# Patient Record
Sex: Female | Born: 1955
Health system: Southern US, Community
[De-identification: ages and names within clinical notes are randomized; demographics above are authoritative.]

## PROBLEM LIST (undated history)

## (undated) DIAGNOSIS — R011 Cardiac murmur, unspecified: Secondary | ICD-10-CM

## (undated) DIAGNOSIS — F419 Anxiety disorder, unspecified: Secondary | ICD-10-CM

## (undated) DIAGNOSIS — R112 Nausea with vomiting, unspecified: Secondary | ICD-10-CM

## (undated) DIAGNOSIS — K5792 Diverticulitis of intestine, part unspecified, without perforation or abscess without bleeding: Secondary | ICD-10-CM

## (undated) DIAGNOSIS — J449 Chronic obstructive pulmonary disease, unspecified: Secondary | ICD-10-CM

## (undated) DIAGNOSIS — Z87898 Personal history of other specified conditions: Secondary | ICD-10-CM

## (undated) DIAGNOSIS — J45909 Unspecified asthma, uncomplicated: Secondary | ICD-10-CM

## (undated) DIAGNOSIS — R0600 Dyspnea, unspecified: Secondary | ICD-10-CM

## (undated) DIAGNOSIS — G971 Other reaction to spinal and lumbar puncture: Secondary | ICD-10-CM

## (undated) DIAGNOSIS — Z9889 Other specified postprocedural states: Secondary | ICD-10-CM

## (undated) DIAGNOSIS — M1990 Unspecified osteoarthritis, unspecified site: Secondary | ICD-10-CM

## (undated) DIAGNOSIS — K589 Irritable bowel syndrome without diarrhea: Secondary | ICD-10-CM

## (undated) DIAGNOSIS — G894 Chronic pain syndrome: Secondary | ICD-10-CM

## (undated) DIAGNOSIS — K219 Gastro-esophageal reflux disease without esophagitis: Secondary | ICD-10-CM

## (undated) DIAGNOSIS — I10 Essential (primary) hypertension: Secondary | ICD-10-CM

## (undated) HISTORY — PX: ABDOMINAL HYSTERECTOMY: SHX81

## (undated) HISTORY — PX: CHOLECYSTECTOMY: SHX55

## (undated) HISTORY — DX: Chronic pain syndrome: G89.4

## (undated) HISTORY — PX: CYST EXCISION: SHX5701

## (undated) HISTORY — PX: TUBAL LIGATION: SHX77

## (undated) HISTORY — PX: BACK SURGERY: SHX140

## (undated) HISTORY — PX: EYE SURGERY: SHX253

---

## 2000-10-10 ENCOUNTER — Encounter: Admission: RE | Admit: 2000-10-10 | Discharge: 2000-10-10 | Payer: Self-pay | Admitting: *Deleted

## 2001-05-20 ENCOUNTER — Encounter: Payer: Self-pay | Admitting: Emergency Medicine

## 2001-05-20 ENCOUNTER — Emergency Department (HOSPITAL_COMMUNITY): Admission: EM | Admit: 2001-05-20 | Discharge: 2001-05-20 | Payer: Self-pay | Admitting: Emergency Medicine

## 2001-12-24 ENCOUNTER — Encounter: Admission: RE | Admit: 2001-12-24 | Discharge: 2001-12-24 | Payer: Self-pay | Admitting: Internal Medicine

## 2001-12-24 ENCOUNTER — Encounter: Payer: Self-pay | Admitting: Internal Medicine

## 2001-12-25 ENCOUNTER — Ambulatory Visit (HOSPITAL_COMMUNITY): Admission: RE | Admit: 2001-12-25 | Discharge: 2001-12-25 | Payer: Self-pay | Admitting: Internal Medicine

## 2001-12-25 ENCOUNTER — Encounter: Payer: Self-pay | Admitting: Internal Medicine

## 2002-01-03 ENCOUNTER — Ambulatory Visit (HOSPITAL_COMMUNITY): Admission: RE | Admit: 2002-01-03 | Discharge: 2002-01-03 | Payer: Self-pay | Admitting: Internal Medicine

## 2002-01-03 ENCOUNTER — Encounter: Payer: Self-pay | Admitting: Internal Medicine

## 2002-09-16 ENCOUNTER — Encounter: Payer: Self-pay | Admitting: Emergency Medicine

## 2002-09-16 ENCOUNTER — Emergency Department (HOSPITAL_COMMUNITY): Admission: EM | Admit: 2002-09-16 | Discharge: 2002-09-16 | Payer: Self-pay | Admitting: Emergency Medicine

## 2002-09-25 ENCOUNTER — Ambulatory Visit (HOSPITAL_COMMUNITY): Admission: RE | Admit: 2002-09-25 | Discharge: 2002-09-25 | Payer: Self-pay | Admitting: Family Medicine

## 2002-09-25 ENCOUNTER — Encounter: Payer: Self-pay | Admitting: Family Medicine

## 2002-11-28 ENCOUNTER — Encounter: Payer: Self-pay | Admitting: Internal Medicine

## 2002-11-28 ENCOUNTER — Ambulatory Visit (HOSPITAL_COMMUNITY): Admission: RE | Admit: 2002-11-28 | Discharge: 2002-11-28 | Payer: Self-pay | Admitting: Internal Medicine

## 2003-03-16 ENCOUNTER — Encounter: Payer: Self-pay | Admitting: *Deleted

## 2003-03-16 ENCOUNTER — Emergency Department (HOSPITAL_COMMUNITY): Admission: EM | Admit: 2003-03-16 | Discharge: 2003-03-16 | Payer: Self-pay | Admitting: *Deleted

## 2003-03-30 ENCOUNTER — Encounter: Payer: Self-pay | Admitting: Family Medicine

## 2003-03-30 ENCOUNTER — Ambulatory Visit (HOSPITAL_COMMUNITY): Admission: RE | Admit: 2003-03-30 | Discharge: 2003-03-30 | Payer: Self-pay | Admitting: Family Medicine

## 2003-04-11 ENCOUNTER — Inpatient Hospital Stay (HOSPITAL_COMMUNITY): Admission: EM | Admit: 2003-04-11 | Discharge: 2003-04-15 | Payer: Self-pay | Admitting: Emergency Medicine

## 2003-04-11 ENCOUNTER — Encounter: Payer: Self-pay | Admitting: Internal Medicine

## 2003-04-13 ENCOUNTER — Encounter: Payer: Self-pay | Admitting: Family Medicine

## 2003-06-30 ENCOUNTER — Emergency Department (HOSPITAL_COMMUNITY): Admission: EM | Admit: 2003-06-30 | Discharge: 2003-06-30 | Payer: Self-pay | Admitting: *Deleted

## 2003-06-30 ENCOUNTER — Encounter: Payer: Self-pay | Admitting: *Deleted

## 2003-10-08 ENCOUNTER — Emergency Department (HOSPITAL_COMMUNITY): Admission: EM | Admit: 2003-10-08 | Discharge: 2003-10-09 | Payer: Self-pay | Admitting: Emergency Medicine

## 2003-10-30 ENCOUNTER — Ambulatory Visit (HOSPITAL_COMMUNITY): Admission: RE | Admit: 2003-10-30 | Discharge: 2003-10-30 | Payer: Self-pay | Admitting: Internal Medicine

## 2003-11-24 ENCOUNTER — Emergency Department (HOSPITAL_COMMUNITY): Admission: EM | Admit: 2003-11-24 | Discharge: 2003-11-24 | Payer: Self-pay | Admitting: Emergency Medicine

## 2003-11-25 ENCOUNTER — Ambulatory Visit (HOSPITAL_COMMUNITY): Admission: RE | Admit: 2003-11-25 | Discharge: 2003-11-25 | Payer: Self-pay | Admitting: Internal Medicine

## 2005-02-25 ENCOUNTER — Emergency Department (HOSPITAL_COMMUNITY): Admission: EM | Admit: 2005-02-25 | Discharge: 2005-02-25 | Payer: Self-pay | Admitting: Emergency Medicine

## 2005-09-11 HISTORY — PX: OTHER SURGICAL HISTORY: SHX169

## 2008-10-23 ENCOUNTER — Encounter (INDEPENDENT_AMBULATORY_CARE_PROVIDER_SITE_OTHER): Payer: Self-pay | Admitting: *Deleted

## 2008-12-08 ENCOUNTER — Inpatient Hospital Stay (HOSPITAL_COMMUNITY): Admission: EM | Admit: 2008-12-08 | Discharge: 2008-12-13 | Payer: Self-pay | Admitting: Emergency Medicine

## 2008-12-23 ENCOUNTER — Ambulatory Visit (HOSPITAL_COMMUNITY): Admission: RE | Admit: 2008-12-23 | Discharge: 2008-12-23 | Payer: Self-pay | Admitting: Internal Medicine

## 2009-02-25 ENCOUNTER — Emergency Department (HOSPITAL_COMMUNITY): Admission: EM | Admit: 2009-02-25 | Discharge: 2009-02-25 | Payer: Self-pay | Admitting: Emergency Medicine

## 2009-04-01 ENCOUNTER — Inpatient Hospital Stay (HOSPITAL_COMMUNITY): Admission: EM | Admit: 2009-04-01 | Discharge: 2009-04-04 | Payer: Self-pay | Admitting: Emergency Medicine

## 2009-10-06 ENCOUNTER — Ambulatory Visit (HOSPITAL_COMMUNITY): Admission: RE | Admit: 2009-10-06 | Discharge: 2009-10-06 | Payer: Self-pay | Admitting: Internal Medicine

## 2010-01-20 ENCOUNTER — Telehealth (INDEPENDENT_AMBULATORY_CARE_PROVIDER_SITE_OTHER): Payer: Self-pay | Admitting: *Deleted

## 2010-04-01 ENCOUNTER — Encounter (INDEPENDENT_AMBULATORY_CARE_PROVIDER_SITE_OTHER): Payer: Self-pay | Admitting: *Deleted

## 2010-05-06 ENCOUNTER — Ambulatory Visit: Payer: Self-pay | Admitting: Internal Medicine

## 2010-05-06 DIAGNOSIS — K5900 Constipation, unspecified: Secondary | ICD-10-CM | POA: Insufficient documentation

## 2010-05-06 DIAGNOSIS — K219 Gastro-esophageal reflux disease without esophagitis: Secondary | ICD-10-CM

## 2010-05-09 ENCOUNTER — Encounter: Payer: Self-pay | Admitting: Internal Medicine

## 2010-05-20 ENCOUNTER — Ambulatory Visit (HOSPITAL_COMMUNITY): Admission: RE | Admit: 2010-05-20 | Discharge: 2010-05-20 | Payer: Self-pay | Admitting: Internal Medicine

## 2010-05-20 ENCOUNTER — Ambulatory Visit: Payer: Self-pay | Admitting: Internal Medicine

## 2010-05-20 HISTORY — PX: COLONOSCOPY: SHX174

## 2010-06-15 ENCOUNTER — Encounter: Payer: Self-pay | Admitting: Urgent Care

## 2010-10-11 NOTE — Letter (Signed)
Summary: Generic Letter, Intro to Referring  [facility]  [address]   [place], [place] [address]   Phone: [phone]  Fax: [phone]          April 01, 2010                RE: Gina Wolfe   06/29/1956                 [address]                 [place], [place]  [address]    Dear Ms. Erman,   Our office has been unable to contact you by phone.  Your primary care doctor would like you to schedule an appointment with [place].  Please contact our office at [phone].      Sincerely,    Elinor Parkinson  [facility] Ph: [phone]   Fax: [phone]

## 2010-10-11 NOTE — Letter (Signed)
Summary: egd/tcs orders  egd/tcs orders   Imported By: Rosine Beat 05/09/2010 16:41:37  _____________________________________________________________________  External Attachment:    Type:   Image     Comment:   External Document

## 2010-10-11 NOTE — Letter (Signed)
Summary: egd/tcs order  egd/tcs order   Imported By: Rosine Beat 05/06/2010 15:57:38  _____________________________________________________________________  External Attachment:    Type:   Image     Comment:   External Document

## 2010-10-11 NOTE — Medication Information (Signed)
Summary: PA for amitiza  PA for amitiza   Imported By: Hendricks Limes LPN 95/62/1308 65:78:46  _____________________________________________________________________  External Attachment:    Type:   Image     Comment:   External Document

## 2010-10-11 NOTE — Assessment & Plan Note (Signed)
Summary: abd pain,constipation,gerd referred by fusco/jbb   Preventive Screening-Counseling & Management  Alcohol-Tobacco     Smoking Status: never  Current Medications (verified): 1)  Premarin 0.625 Mg Tabs (Estrogens Conjugated) .... Once Daily 2)  Xanax 1 Mg Tabs (Alprazolam) .... As Needed 3)  Vitamin C .... Once Daily 4)  Vitamin E .... Once Daily 5)  Vitamin D .... Once Daily 6)  Fish Oil 1000 Mg Caps (Omega-3 Fatty Acids) .... Once Daily  Allergies (verified): 1)  ! Codeine 2)  ! Clindamycin Hcl (Clindamycin Hcl)  Past History:  Past Surgical History: 2 lower back neck knee hyst gallbladder  Family History: Father: deceased cancer Mother: deceased heart Siblings: 5 brothers, 38 sisters No FH of Colon Cancer:  Social History: Marital Status: Married Children: 2 Occupation: yes, [place] Patient has never smoked.  Alcohol Use - no Smoking Status:  never  Vital Signs:  Patient profile:   55 year old female Height:      62 inches Weight:      147 pounds BMI:     26.98 Temp:     97.7 degrees F oral Pulse rate:   68 / minute BP sitting:   118 / 70  (left arm) Cuff size:   regular  Vitals Entered By: Hendricks Limes LPN (May 06, 2010 3:11 PM)  Appended Document: abd pain,constipation,gerd referred by fusco/jbb  55 year old lady  with a three-year history of worsening GERD symptoms she describes retrosternal burning and heartburn symptoms. She's gained 26 pounds since she stopped smoking. No dysphagia. No nausea or vomiting - chronic left-sided abdominal pain; she is chronically constipated. Maybe, one or 2 bowel movements weekly; she's tried a number of over-the-counter agents in the past without any relief (including miralax); may have tried [place] but is not sure. History of diverticulosis and no prior CT. Reportedly had a colonoscopy for nonspecific symptoms at [facility] 10 years ago without significant findings. No history of colon polyps or cancer in the  family. No prior EGD.    Physical examination: Alert conversant no acute distress  Chest lungs are clear to auscultation; cardiac exam regular rate and rhythm without murmur gallop rub  Abdomen somewhat obese positive bowel sounds some left-sided mid and left for quadrant abdominal tenderness. No appreciable mass or organomegaly.  Rectal exam deferred to colonoscopy  Impression: 55 year old lady with new onset reflux symptoms worsening over the past 3 years. No doubt, significant weight gain has likely predisposed her reflux. She needs further evaluation. She is chronically constipated and has a left-sided abdominal pain. She does not look acutely ill this time. Will offer her a screening colonoscopy as well in the near future.  Risk, benefits, limitations, alternatives and imponderables have been reviewed; her questions were answered; she is agreeable. We'll make further recommendations once EGD and colonoscopy have been carried out.  I thank Dr. Artis Delay for his kind referral of this nice lady today.  Appended Document: Orders Update    Clinical Lists Changes  Problems: Added new problem of GERD (ICD-530.81) Added new problem of CONSTIPATION (ICD-564.00) Added new problem of SCREENING, COLORECTAL CANCER (ICD-V76.51) Orders: Added new Service order of Consultation Level IV [phone]) - Signed

## 2010-10-11 NOTE — Progress Notes (Signed)
Summary: Schedule recall follow-up visit  Phone Note Outgoing Call Call back at [facility] Phone [phone]   Call placed by: Christie Nottingham CMA Duncan Dull),  Jan 20, 2010 2:23 PM Call placed to: Patient Summary of Call: Tried to call pt to schedule recall follow-up visit but phone # is disconnected. Letter will be mailed. Initial call taken by: Christie Nottingham CMA Duncan Dull),  Jan 20, 2010 2:24 PM

## 2010-12-18 LAB — BASIC METABOLIC PANEL
BUN: 10 mg/dL (ref 6–23)
CO2: 30 mEq/L (ref 19–32)
Calcium: 8.4 mg/dL (ref 8.4–10.5)
Chloride: 106 mEq/L (ref 96–112)
Chloride: 107 mEq/L (ref 96–112)
Creatinine, Ser: 0.82 mg/dL (ref 0.4–1.2)
GFR calc Af Amer: >60 mL/min (ref >60)
GFR calc Af Amer: >60 mL/min (ref >60)
GFR calc non Af Amer: >60 mL/min (ref >60)
Potassium: 3.2 mEq/L — ABNORMAL LOW (ref 3.5–5.1)
Potassium: 3.9 mEq/L (ref 3.5–5.1)
Sodium: 139 mEq/L (ref 135–145)
Sodium: 140 mEq/L (ref 135–145)

## 2010-12-18 LAB — CBC
HCT: 35.2 % — ABNORMAL LOW (ref 36.0–46.0)
Hemoglobin: 12.3 g/dL (ref 12.0–15.0)
Hemoglobin: 13.9 g/dL (ref 12.0–15.0)
MCHC: 34.6 g/dL (ref 30.0–36.0)
MCHC: 34.8 g/dL (ref 30.0–36.0)
MCHC: 35 g/dL (ref 30.0–36.0)
MCHC: 35.3 g/dL (ref 30.0–36.0)
MCV: 94.2 fL (ref 78.0–100.0)
MCV: 94.6 fL (ref 78.0–100.0)
MCV: 95.3 fL (ref 78.0–100.0)
RBC: 3.72 MIL/uL — ABNORMAL LOW (ref 3.87–5.11)
RDW: 13 % (ref 11.5–15.5)
RDW: 13 % (ref 11.5–15.5)
WBC: 11.4 10*3/uL — ABNORMAL HIGH (ref 4.0–10.5)
WBC: 12.7 10*3/uL — ABNORMAL HIGH (ref 4.0–10.5)

## 2010-12-18 LAB — POCT CARDIAC MARKERS
CKMB, poc: 1.1 ng/mL (ref 1.0–8.0)
CKMB, poc: <1 ng/mL — ABNORMAL LOW (ref 1.0–8.0)
Myoglobin, poc: 27.5 ng/mL (ref 12–200)
Myoglobin, poc: 31.8 ng/mL (ref 12–200)
Troponin i, poc: <0.05 ng/mL (ref 0.00–0.09)

## 2010-12-18 LAB — DIFFERENTIAL
Basophils Absolute: 0 10*3/uL (ref 0.0–0.1)
Basophils Absolute: 0 10*3/uL (ref 0.0–0.1)
Basophils Relative: 0 % (ref 0–1)
Eosinophils Absolute: 0 10*3/uL (ref 0.0–0.7)
Eosinophils Absolute: 0.1 10*3/uL (ref 0.0–0.7)
Eosinophils Absolute: 0.2 10*3/uL (ref 0.0–0.7)
Eosinophils Relative: 1 % (ref 0–5)
Eosinophils Relative: 2 % (ref 0–5)
Lymphocytes Relative: 27 % (ref 12–46)
Lymphocytes Relative: 47 % — ABNORMAL HIGH (ref 12–46)
Lymphs Abs: 3.1 10*3/uL (ref 0.7–4.0)
Lymphs Abs: 3.2 10*3/uL (ref 0.7–4.0)
Lymphs Abs: 4 10*3/uL (ref 0.7–4.0)
Monocytes Absolute: 0.5 10*3/uL (ref 0.1–1.0)
Monocytes Absolute: 0.7 10*3/uL (ref 0.1–1.0)
Monocytes Absolute: 0.7 10*3/uL (ref 0.1–1.0)
Monocytes Relative: 4 % (ref 3–12)
Monocytes Relative: 6 % (ref 3–12)
Neutro Abs: 2.9 10*3/uL (ref 1.7–7.7)
Neutrophils Relative %: 66 % (ref 43–77)

## 2010-12-18 LAB — COMPREHENSIVE METABOLIC PANEL
ALT: 22 U/L (ref 0–35)
AST: 20 U/L (ref 0–37)
AST: 24 U/L (ref 0–37)
Albumin: 2.9 g/dL — ABNORMAL LOW (ref 3.5–5.2)
CO2: 31 mEq/L (ref 19–32)
Calcium: 8.4 mg/dL (ref 8.4–10.5)
Calcium: 8.9 mg/dL (ref 8.4–10.5)
Chloride: 106 mEq/L (ref 96–112)
Creatinine, Ser: 0.78 mg/dL (ref 0.4–1.2)
GFR calc Af Amer: >60 mL/min (ref >60)
GFR calc Af Amer: >60 mL/min (ref >60)
GFR calc non Af Amer: >60 mL/min (ref >60)
Glucose, Bld: 73 mg/dL (ref 70–99)
Glucose, Bld: 95 mg/dL (ref 70–99)
Potassium: 4.3 mEq/L (ref 3.5–5.1)
Sodium: 138 mEq/L (ref 135–145)
Sodium: 140 mEq/L (ref 135–145)
Total Protein: 4.9 g/dL — ABNORMAL LOW (ref 6.0–8.3)
Total Protein: 5.3 g/dL — ABNORMAL LOW (ref 6.0–8.3)

## 2010-12-18 LAB — WOUND CULTURE: Gram Stain: NONE SEEN

## 2010-12-18 LAB — D-DIMER, QUANTITATIVE: D-Dimer, Quant: 0.22 ug/mL-FEU (ref 0.00–0.48)

## 2010-12-18 LAB — SEDIMENTATION RATE: Sed Rate: 16 mm/hr (ref 0–22)

## 2010-12-18 LAB — C-REACTIVE PROTEIN: CRP: 1.9 mg/dL — ABNORMAL HIGH (ref <0.6)

## 2010-12-21 LAB — DIFFERENTIAL
Basophils Absolute: 0 10*3/uL (ref 0.0–0.1)
Basophils Absolute: 0 10*3/uL (ref 0.0–0.1)
Basophils Relative: 0 % (ref 0–1)
Basophils Relative: 0 % (ref 0–1)
Basophils Relative: 0 % (ref 0–1)
Eosinophils Absolute: 0 10*3/uL (ref 0.0–0.7)
Eosinophils Absolute: 0 10*3/uL (ref 0.0–0.7)
Eosinophils Absolute: 0 10*3/uL (ref 0.0–0.7)
Eosinophils Relative: 0 % (ref 0–5)
Eosinophils Relative: 0 % (ref 0–5)
Lymphocytes Relative: 9 % — ABNORMAL LOW (ref 12–46)
Lymphs Abs: 1 10*3/uL (ref 0.7–4.0)
Lymphs Abs: 4.6 10*3/uL — ABNORMAL HIGH (ref 0.7–4.0)
Monocytes Absolute: 0.3 10*3/uL (ref 0.1–1.0)
Monocytes Absolute: 0.3 10*3/uL (ref 0.1–1.0)
Monocytes Relative: 3 % (ref 3–12)
Monocytes Relative: 4 % (ref 3–12)
Monocytes Relative: 7 % (ref 3–12)
Neutro Abs: 3.6 10*3/uL (ref 1.7–7.7)
Neutrophils Relative %: 41 % — ABNORMAL LOW (ref 43–77)
Neutrophils Relative %: 82 % — ABNORMAL HIGH (ref 43–77)

## 2010-12-21 LAB — CBC
HCT: 30.9 % — ABNORMAL LOW (ref 36.0–46.0)
HCT: 32.2 % — ABNORMAL LOW (ref 36.0–46.0)
Hemoglobin: 11 g/dL — ABNORMAL LOW (ref 12.0–15.0)
MCHC: 34.5 g/dL (ref 30.0–36.0)
MCHC: 34.9 g/dL (ref 30.0–36.0)
MCV: 94.2 fL (ref 78.0–100.0)
MCV: 94.5 fL (ref 78.0–100.0)
MCV: 95.8 fL (ref 78.0–100.0)
Platelets: 194 10*3/uL (ref 150–400)
RBC: 3.17 MIL/uL — ABNORMAL LOW (ref 3.87–5.11)
RBC: 3.36 MIL/uL — ABNORMAL LOW (ref 3.87–5.11)
RBC: 3.36 MIL/uL — ABNORMAL LOW (ref 3.87–5.11)
RDW: 13.4 % (ref 11.5–15.5)
RDW: 13.6 % (ref 11.5–15.5)
WBC: 11.5 10*3/uL — ABNORMAL HIGH (ref 4.0–10.5)
WBC: 8.9 10*3/uL (ref 4.0–10.5)

## 2010-12-21 LAB — BASIC METABOLIC PANEL
BUN: 5 mg/dL — ABNORMAL LOW (ref 6–23)
CO2: 22 mEq/L (ref 19–32)
Calcium: 7.8 mg/dL — ABNORMAL LOW (ref 8.4–10.5)
Chloride: 112 mEq/L (ref 96–112)
Chloride: 116 mEq/L — ABNORMAL HIGH (ref 96–112)
Chloride: 117 mEq/L — ABNORMAL HIGH (ref 96–112)
Creatinine, Ser: 0.58 mg/dL (ref 0.4–1.2)
Creatinine, Ser: 0.72 mg/dL (ref 0.4–1.2)
Creatinine, Ser: 0.72 mg/dL (ref 0.4–1.2)
GFR calc Af Amer: >60 mL/min (ref >60)
GFR calc Af Amer: >60 mL/min (ref >60)
GFR calc non Af Amer: >60 mL/min (ref >60)
GFR calc non Af Amer: >60 mL/min (ref >60)
Glucose, Bld: 133 mg/dL — ABNORMAL HIGH (ref 70–99)
Glucose, Bld: 83 mg/dL (ref 70–99)
Potassium: 2.9 mEq/L — ABNORMAL LOW (ref 3.5–5.1)
Potassium: 3.4 mEq/L — ABNORMAL LOW (ref 3.5–5.1)
Sodium: 143 mEq/L (ref 135–145)

## 2010-12-21 LAB — PROTIME-INR: INR: 1.1 (ref 0.00–1.49)

## 2010-12-22 LAB — STOOL CULTURE

## 2010-12-22 LAB — DIFFERENTIAL
Basophils Absolute: 0 10*3/uL (ref 0.0–0.1)
Basophils Absolute: 0 10*3/uL (ref 0.0–0.1)
Basophils Absolute: 0 10*3/uL (ref 0.0–0.1)
Basophils Relative: 0 % (ref 0–1)
Basophils Relative: 0 % (ref 0–1)
Eosinophils Absolute: 0.1 10*3/uL (ref 0.0–0.7)
Eosinophils Relative: 0 % (ref 0–5)
Lymphocytes Relative: 10 % — ABNORMAL LOW (ref 12–46)
Lymphocytes Relative: 3 % — ABNORMAL LOW (ref 12–46)
Monocytes Absolute: 0.3 10*3/uL (ref 0.1–1.0)
Monocytes Relative: 4 % (ref 3–12)
Neutro Abs: 8.3 10*3/uL — ABNORMAL HIGH (ref 1.7–7.7)
Neutrophils Relative %: 88 % — ABNORMAL HIGH (ref 43–77)
Neutrophils Relative %: 97 % — ABNORMAL HIGH (ref 43–77)

## 2010-12-22 LAB — URINE CULTURE
Colony Count: NO GROWTH
Culture: NO GROWTH

## 2010-12-22 LAB — COMPREHENSIVE METABOLIC PANEL
ALT: 19 U/L (ref 0–35)
Alkaline Phosphatase: 48 U/L (ref 39–117)
CO2: 26 mEq/L (ref 19–32)
Calcium: 8.8 mg/dL (ref 8.4–10.5)
GFR calc non Af Amer: >60 mL/min (ref >60)
Glucose, Bld: 111 mg/dL — ABNORMAL HIGH (ref 70–99)
Potassium: 3.7 mEq/L (ref 3.5–5.1)
Sodium: 140 mEq/L (ref 135–145)
Total Bilirubin: 0.5 mg/dL (ref 0.3–1.2)

## 2010-12-22 LAB — POCT I-STAT, CHEM 8
Calcium, Ion: 1.04 mmol/L — ABNORMAL LOW (ref 1.12–1.32)
Creatinine, Ser: 0.7 mg/dL (ref 0.4–1.2)
Glucose, Bld: 101 mg/dL — ABNORMAL HIGH (ref 70–99)
HCT: 45 % (ref 36.0–46.0)
Hemoglobin: 15.3 g/dL — ABNORMAL HIGH (ref 12.0–15.0)
Potassium: 3.7 mEq/L (ref 3.5–5.1)

## 2010-12-22 LAB — URINALYSIS, ROUTINE W REFLEX MICROSCOPIC
Glucose, UA: NEGATIVE mg/dL
Nitrite: NEGATIVE
Specific Gravity, Urine: 1.025 (ref 1.005–1.030)
pH: 5.5 (ref 5.0–8.0)

## 2010-12-22 LAB — LIPASE, BLOOD: Lipase: 18 U/L (ref 11–59)

## 2010-12-22 LAB — BLOOD GAS, ARTERIAL
FIO2: 0.21 %
O2 Saturation: 96.1 %
Patient temperature: 37
pH, Arterial: 7.326 — ABNORMAL LOW (ref 7.350–7.400)

## 2010-12-22 LAB — CBC
HCT: 32.9 % — ABNORMAL LOW (ref 36.0–46.0)
HCT: 41.5 % (ref 36.0–46.0)
Hemoglobin: 11.3 g/dL — ABNORMAL LOW (ref 12.0–15.0)
Hemoglobin: 12.2 g/dL (ref 12.0–15.0)
Hemoglobin: 14.5 g/dL (ref 12.0–15.0)
MCHC: 34.3 g/dL (ref 30.0–36.0)
MCHC: 34.4 g/dL (ref 30.0–36.0)
MCHC: 34.9 g/dL (ref 30.0–36.0)
MCV: 93.7 fL (ref 78.0–100.0)
Platelets: 240 10*3/uL (ref 150–400)
RBC: 3.46 MIL/uL — ABNORMAL LOW (ref 3.87–5.11)
RBC: 4.43 MIL/uL (ref 3.87–5.11)
RDW: 12.4 % (ref 11.5–15.5)
RDW: 12.9 % (ref 11.5–15.5)
RDW: 13.2 % (ref 11.5–15.5)
WBC: 10.8 10*3/uL — ABNORMAL HIGH (ref 4.0–10.5)

## 2010-12-22 LAB — BASIC METABOLIC PANEL
BUN: 3 mg/dL — ABNORMAL LOW (ref 6–23)
CO2: 18 mEq/L — ABNORMAL LOW (ref 19–32)
Calcium: 7.7 mg/dL — ABNORMAL LOW (ref 8.4–10.5)
Calcium: 7.9 mg/dL — ABNORMAL LOW (ref 8.4–10.5)
Chloride: 112 mEq/L (ref 96–112)
Creatinine, Ser: 0.59 mg/dL (ref 0.4–1.2)
Creatinine, Ser: 0.84 mg/dL (ref 0.4–1.2)
GFR calc Af Amer: >60 mL/min (ref >60)
GFR calc non Af Amer: >60 mL/min (ref >60)
GFR calc non Af Amer: >60 mL/min (ref >60)
GFR calc non Af Amer: >60 mL/min (ref >60)
Glucose, Bld: 152 mg/dL — ABNORMAL HIGH (ref 70–99)
Glucose, Bld: 228 mg/dL — ABNORMAL HIGH (ref 70–99)
Potassium: 3.8 mEq/L (ref 3.5–5.1)
Sodium: 139 mEq/L (ref 135–145)

## 2010-12-22 LAB — CLOSTRIDIUM DIFFICILE EIA

## 2010-12-22 LAB — OVA AND PARASITE EXAMINATION

## 2010-12-22 LAB — POCT CARDIAC MARKERS
CKMB, poc: <1 ng/mL — ABNORMAL LOW (ref 1.0–8.0)
CKMB, poc: <1 ng/mL — ABNORMAL LOW (ref 1.0–8.0)
Myoglobin, poc: 29.7 ng/mL (ref 12–200)
Troponin i, poc: <0.05 ng/mL (ref 0.00–0.09)

## 2010-12-22 LAB — MAGNESIUM: Magnesium: 1.6 mg/dL (ref 1.5–2.5)

## 2010-12-22 LAB — FECAL LACTOFERRIN, QUANT

## 2010-12-22 LAB — PHOSPHORUS: Phosphorus: 2.1 mg/dL — ABNORMAL LOW (ref 2.3–4.6)

## 2011-01-24 NOTE — Group Therapy Note (Signed)
NAMELAKELY, ELMENDORF NO.:  [id number]   MEDICAL RECORD NO.:  [id number]          PATIENT TYPE:  INP   LOCATION:  A309                          FACILITY:  [facility]   PHYSICIAN:  Margaretmary Dys, M.D.DATE OF BIRTH:  27-Feb-1956   DATE OF PROCEDURE:  12/12/2008  DATE OF DISCHARGE:                                 PROGRESS NOTE   SUBJECTIVE:  The patient feels better today, has no shortness of breath.  No nausea or vomiting.  The patient had an ultrasound of her lower  extremities and she has no evidence of deep venous thromboses.  She  otherwise feels much better today.   OBJECTIVE:  Conscious, alert, comfortable, not in acute distress.  Well-  oriented in time, place and person.  VITAL SIGNS:  Her blood pressure was 117/71, pulse of 70, respirations  18, temperature 98.2 degrees Fahrenheit oxygen saturation was 97% on  room air.  HEENT EXAM:  Normocephalic, atraumatic.  Oral mucosa was moist with no  exudates.  NECK:  Supple.  No JVD or lymphadenopathy.  LUNGS:  Reduced air entry bilaterally with occasional wheezing.  HEART:  S1, S2 regular; no S3, S4, gallops or rubs.  ABDOMEN:  Soft, nontender.  Bowel sounds positive.  No masses palpable.  EXTREMITIES:  No edema.  No calf induration or tenderness was noted.   LABORATORY/DIAGNOSTICS DATA:  White blood counts 10.3, hemoglobin 10.7,  hematocrit of 30.9, platelet count was 194 with no left shift.  Sodium  141, potassium was 2.9, chloride 112, CO2 was 26, glucose of 83.  BUN  has been normal, creatinine was also normal.   ASSESSMENT/PLAN:  The patient doing much better.  Her nausea and  vomiting has significantly improved; her diarrhea has also resolved.  She had no evidence of deep venous thromboses in her lower extremities.  The plan is for the patient to be discharged home likely tomorrow.  The  patient has emphysema.  I spent a fair amount of time discussing smoking  cessation counseling with her.      Margaretmary Dys, M.D.  Electronically Signed     AM/MEDQ  D:  12/12/2008  T:  12/13/2008  Job:  [id number]

## 2011-01-24 NOTE — H&P (Signed)
Gina Wolfe, DRENNING NO.:  [id number]   MEDICAL RECORD NO.:  [id number]          PATIENT TYPE:  INP   LOCATION:  A330                          FACILITY:  [facility]   PHYSICIAN:  Melissa L. Ladona Ridgel, MD  DATE OF BIRTH:  03/08/1956   DATE OF ADMISSION:  04/01/2009  DATE OF DISCHARGE:  LH                              HISTORY & PHYSICAL   HISTORY OF PRESENT ILLNESS:  [name] is a 55 year old female with a  previous history of emphysema and acute renal failure secondary to  NSAIDs and Cipro.  She had a recent history of an infection and swelling  on her forehead in June 2010.  She presents to the ED today with similar  symptoms.  She tells me that she was mowing her lawn last Friday, March 26, 2009, after she went in and showered, she noticed that she had some  stinging on her right cheek.  At that point, she noticed she had what  appeared to be an insect bite on her cheek.  Over the next several days,  it swelled and became ulcerated.  She treated it with cold compresses,  Benadryl, and antibacterial ointment.  It got better and that the  swelling decreased.  However, the ulcerated area did not appear to be  healing.  So, she saw her primary care physician Dr. Sherwood Gambler yesterday,  March 31, 2009.  He prescribed Bactrim for her and suggested that she use  warm compresses 4 times a day, which she went home and did.  She was  awakened at 3 a.m. this morning by facial pain, throbbing, and severe  swelling on the right side.  She developed a fullness in her throat and  tongue that made it difficult to breath, so she decided to come to the  ER this morning.  She was treated in the ER with a dose of clindamycin  shortly after this dose was given.  She appeared to have worsening of  her symptoms with chest pain, abdominal pain, and worsening of her  shortness of breath.  By the time I saw [name], she had been here  approximately 3-4 hours.   Dictation ended at this  point.      Stephani Police, PA      Melissa L. Ladona Ridgel, MD  Electronically Signed    MLY/MEDQ  D:  04/01/2009  T:  04/02/2009  Job:  [id number]

## 2011-01-24 NOTE — Consult Note (Signed)
NAMEEZELLA, KELL NO.:  [id number]   MEDICAL RECORD NO.:  [id number]          PATIENT TYPE:  INP   LOCATION:  A330                          FACILITY:  [facility]   PHYSICIAN:  Tilford Pillar, MD      DATE OF BIRTH:  [birth year]   DATE OF CONSULTATION:  04/01/2009  DATE OF DISCHARGE:  04/04/2009                                 CONSULTATION   REASON FOR CONSULTATION:  Erythema and cellulitis of the right frontal  face.   HISTORY OF PRESENT ILLNESS:  The patient is a 55 year old female with a  history of COPD, anxiety and obesity who presents with several areas of  increasing pain on the right buccal region of the face.  This was  started relatively acutely.  The erythema and edema has increased.  She  did state some difficulties with swallowing and discomfort upon taking  oral intake.  She states __________.  She denies any fever or chills.  No nausea and vomiting.  She does not remember any trauma to this area.   PAST SURGICAL HISTORY:  1. History of COPD with emphysema.  2. History of pyelonephritis.  3. Obesity.  4. Anxiety.   PAST SURGICAL HISTORY:  1. Prior cholecystectomy.  2. Prior hysterectomy.  3. Lower back surgeries.  4. Cervical spine procedure.   MEDICATIONS:  Premarin, prednisone, Bactrim.   ALLERGIES:  1. CODEINE causes nightmares.  2. Allergies to NSAIDS and CIPRO.   SOCIAL HISTORY:  No tobacco.  No alcohol.  No recreational drug use.   PHYSICAL EXAMINATION:  VITAL SIGNS:  The patient's temperature is 98.1,  heart rate 60, respirations 20, blood pressure 111/78.  She is 98% O2  saturation on room air.  GENERAL:  In general patient is in a seated position in her hospital  bed.  She is not in any acute distress.  She is alert and oriented x3.  HEENT:  Scalp no deformities.  No mass.  Eyes:  Pupils are equal, round,  reactive.  Extraocular was intact.  No conjunctival pallor is noted.  Oral mucosa is pink.  Normal occlusion.  Evaluation of  the right face:  She does have buccal swelling in this area.  There is a small area of  the sloughing necrotic tissue, centrally located within the area of  erythema.  There is scan amount of the serous and purulent discharge  noted from this.  On palpation she is exquisitely tender and indurated,  but no evidence of any fluctuation.  No cervical lymphadenopathy.  Trachea is midline.  PULMONARY:  Unlabored respirations.  She is clear to auscultation.  CARDIOVASCULAR:  Regular rate and rhythm.  No murmurs or gallops.  ABDOMINAL EXAM:  Positive bowel sounds  The abdomen is soft and  nontender.  EXTREMITIES:  Warm and dry.   PERTINENT LABORATORY STUDIES:  CBC white blood cell count 12.7,  hemoglobin of 13.9, hematocrit 39.4, platelets 355,000.  Basic metabolic  panel was within normal limits other than a hypercholesterolemia of 3.2.   ASSESSMENT/PLAN:  Cellulitis of the right face.  At this time,  I do not  appreciate any fluctuance consistent with a deep abscess.  Would  continued to recommend broad spectrum antibiotics and particular  coverage for MRSA as well as continued warm compresses to this area to  help express any fluid from the area and potentially to create a  fluctuant mass for future drainage.  At this time surgical debridement  would yield very limited results.  I would obviously continue  analgesics.   Appreciate the opportunity to assist in this patient's care and will  continue to follow the patient during her course at [facility].      Tilford Pillar, MD  Electronically Signed     BZ/MEDQ  D:  04/06/2009  T:  04/07/2009  Job:  [id number]   cc:   Primary care physician

## 2011-01-24 NOTE — H&P (Signed)
Gina Wolfe, Gina Wolfe NO.:  [id number]   MEDICAL RECORD NO.:  [id number]          PATIENT TYPE:  INP   LOCATION:  A309                          FACILITY:  [facility]   PHYSICIAN:  Melissa L. Ladona Ridgel, MD  DATE OF BIRTH:  1956-05-26   DATE OF ADMISSION:  12/08/2008  DATE OF DISCHARGE:  LH                              HISTORY & PHYSICAL   CHIEF COMPLAINT:  Nausea, vomiting, diarrhea, and shortness of breath.   PRIMARY CARE PHYSICIAN:  Madelin Rear. Sherwood Gambler, MD   HISTORY OF PRESENT ILLNESS:  The patient is a 55 year old white female  who states I got the stomach virus.  The patient states she started  with diarrhea then nausea and vomiting, and developed lightheadedness  and chest pressure followed by shortness of breath.  The patient was  exposed to several family members with diarrhea, vomiting which was self  limiting while she was visiting in [place].  The patient came to the  emergency room because she developed left breast pressure.  She felt  like a ball sitting under her left breast.  She cannot decide what made  the pressure better, but definitely did not have anything to make it  worse.  These symptoms appeared to resolve after a treatment with pain  medication in the emergency room.  She states that she has had cough  without any sputum production at baseline and suffers from bronchial  asthma for which she uses inhaler.   REVIEW OF SYSTEMS:  CONSTITUTIONAL:  Appetite has been fine.  No weight  loss or gain.  No fevers, chills.  EYES:  No blurred vision, double  vision.  EAR, NOSE, AND THROAT:  No tinnitus, dysphagia or discharge.  CARDIOVASCULAR:  Chest pressure which has resolved.  RESPIRATORY:  Shortness of breath with cough, but no sputum production.  GI:  Positive  nausea, vomiting, and diarrhea which is improving.  GU:  No hesitancy,  frequency, or dysuria.  ENDOCRINE:  She is not having diabetes or  thyroid disorder.  HEMATOLOGIC:  No bleeding disorder.   PSYCHIATRIC:  No  depression or anxiety.   PAST MEDICAL HISTORY:  Bronchial asthma, diverticulosis.  No diabetes,  no hypertension.   PAST SURGICAL HISTORY:  Hysterectomy, appendectomy, cholecystectomy,  knee surgery and low back pain and cervical neck surgery.   SOCIAL HISTORY:  The patient smokes less than a pack of cigarettes a day  for many years.  She does not drink alcohol.  She has 2 children.  She  is married.  Husband's name is Viviann Spare, his number is [phone].  She  has no occupation at this time.   FAMILY HISTORY:  Mother is deceased with a history of emphysema,  diabetes, hypertension.  Father is deceased with history of cancer.   ALLERGIES:  CODEINE.   HOME MEDICATIONS:  Albuterol inhaler, azithromycin, and prednisone.  Just taking an over-the-counter cough medicine.   LABORATORY DATA:  UA is negative.  Cardiac markers are negative x2.  White count 10.8, hemoglobin 14.5, hematocrit 41.5, and platelets of  240.  Her D-dimer is 0.40, lipase  is 18, sodium 142, potassium 3.7,  chloride 108, CO2 is 21, BUN is 4, creatinine 0.7, and glucose is 101.  EKG shows a poor baseline otherwise no ST-T wave changes are noted.  She  has normal sinus rhythm.   Temperature 98.2, blood pressure 103/63, pulse 102, respirations 20,  saturations 92%.   CT scan shows central lobular emphysema with dependent atelectasis and  right upper lobe nodules measuring 4 mm and 5 mm x 4 mm in the lingular  area.  There is no pulmonary emboli.   PHYSICAL EXAMINATIONS:  VITAL SIGNS:  Currently blood pressure was  95/52, respirations 16, saturations 96%, and her temperature is down to  98.2.  GENERAL:  In no acute distress.  She is able to complete full sentences.  HEENT:  She is normocephalic, atraumatic.  Pupils are equal, round,  reactive to light.  Extraocular movements are intact.  She has  nonicteric sclerae.  Examination of the ears reveal tympanic membranes  are intact.  No discharge.   External lesions are not present.  Nose,  septum is midline with intact turbinates, no discharge.  Mouth, there  are oral lesions, no lip lesions.  NECK:  Supple.  There is no JVD.  No lymphadenopathy.  No carotid bruit.  Trachea is in the midline.  There is no thyromegaly.  CHEST:  Distant with no rhonchi, rales, or wheezes.  CARDIOVASCULAR:  Regular rate and rhythm.  Positive S1, S2.  No S3, S4.  She has 1-2/6 systolic ejection murmur at left sternal border.  There is  no heaves or thrills and the apical impulse is nondisplaced.  ABDOMEN:  Soft, nontender, nondistended with positive bowel sounds.  EXTREMITIES:  No clubbing, cyanosis, or edema.  NEUROLOGIC:  She is awake, alert, oriented x3.  Cranial nerves II  through XII are intact.  Power is 5/5.  DTRs are 2+, plantars are  downgoing.  PSYCHIATRIC:  Affect is appropriate.  Recent and remote memory are  intact.  Judgment is intact.   ASSESSMENT AND PLAN:  A 55 year old white female with a history of  asthma presents with symptoms of viral gastroenteritis, but has had  complications of chest pressure and increased shortness of breath with  hypoxia consistent with a chronic obstructive pulmonary disease  exacerbation.  Chest has mild hypotension likely secondary to relative  dehydration.   1. Cardiovascular hypertension, we will continue aggressive      rehydration.  Cardiac markers have been negative x2.  She has no      further chest pressure, we will send a third marker, but I do not      think this is cardiac in origin.  If further chest pressure      presents we may consider cardiac workup.  2. Pulmonary, advanced chronic obstructive pulmonary disease with      nodules.  The patient will need a followup CT in 3 months.  There      is no evidence for PE, we will continue her with steroids,      nebulizers, a flutter valve and mucinex, ceftriaxone, and      azithromycin.  3. Gastrointestinal.  Nausea, vomiting, and diarrhea is  improving with      time.  We will continue to symptomatically treat her and if she      continues to have diarrhea, we will can send stool cultures.  4. Genitourinary.  She has a history of hysterectomy and is on      hormonal  therapy.  We will resume her home dose.  5. Endocrine.  No history of diabetes.  If her blood sugars present as      elevated on her a.m. labs, then we will      consider starting sliding scale insulin.  6. Deep vein thrombosis prophylaxis will be with Lovenox or heparin.   Total time on this case was from 4:45 to 5:55 a.m.      Melissa L. Ladona Ridgel, MD  Electronically Signed     MLT/MEDQ  D:  12/08/2008  T:  12/09/2008  Job:  [id number]   cc:   Madelin Rear. Sherwood Gambler, MD  Fax: [phone]

## 2011-01-24 NOTE — Discharge Summary (Signed)
Gina Wolfe, Gina Wolfe NO.:  [id number]   MEDICAL RECORD NO.:  [id number]          PATIENT TYPE:  INP   LOCATION:  A330                          FACILITY:  [facility]   PHYSICIAN:  Renee Ramus, MD       DATE OF BIRTH:  [birth year]   DATE OF ADMISSION:  04/01/2009  DATE OF DISCHARGE:  LH                               DISCHARGE SUMMARY   PRIMARY CARE PHYSICIAN:  Madelin Rear. Sherwood Gambler, MD.   PRIMARY DISCHARGE DIAGNOSIS:  Cellulitis.   SECONDARY DIAGNOSES:  1. Candiduria.  2. Hormone-replacement therapy.  3. Chronic obstructive pulmonary disease.  4. Anxiety.  5. Obesity.   HOSPITAL COURSE:  #1.  CELLULITIS:  The patient is a 55 year old female who was admitted  secondary to facial pain with increasing erythema centering from what  looks to be an insect bite.  The patient was seen in the emergency  department and admitted to our service.  The patient did have scans  showing no evidence of underlying abscess.  The patient was treated  initially with vancomycin and Zosyn. She has now been transitioned to  Augmentin.  She will continue this x7 days postdischarge.  The patient  did have leukocytosis upon admission.  This is now resolved.  Her  symptoms have resolved, and her erythema has decreased considerably.  The patient will follow up with her primary care physician if she has no  complaints or requirements   #2.  CANDIDURIA:  The patient will continue course of Diflucan x3 days,  then discontinue.   #3.  HORMONE-REPLACEMENT THERAPY:  The patient will continue estrogen  supplements.   #4.  CHRONIC OBSTRUCTIVE PULMONARY DISEASE:  This is stable, has not  required treatment in hospital.   #5.  ANXIETY:  This is stable and also has not required treatment.   #6.  OBESITY:  This is stable.  The patient has what looks to be a  normal thyroid.   LABORATORY DATA OF NOTE:  1. Initial leukocytosis with white count of 12.7, decreasing to 6.5.  2. Mild anemia with  hemoglobin of 12.4, hematocrit of 35.3.  3. Mild hypoalbuminemia with an albumin of 2.8.  4. Negative troponin upon admission.   STUDIES.:  1. Maxillofacial CT with contrast showing an inflammatory process in      the superficial soft tissues lateral aspect of the right face but      no deep cervical abnormalities, no abscess, no evidence of bone or      sinus involvement.  2. Chest x-ray showing no acute disease.   DISCHARGE MEDICATIONS:  1. Premarin 0.5 mg p.o. daily.  2. Augmentin 875 mg 1 p.o. b.i.d. x7 days.  3. Diflucan 200 mg p.o. daily x3 days.  4. Percocet 5/325 one to two p.o. q.6 h p.r.n. pain.   There are no labs or studies pending at time of discharge. The patient  is in stable condition and anxious for discharge.   Time spent:  35 minutes.      Renee Ramus, MD  Electronically Signed     JF/MEDQ  D:  04/04/2009  T:  04/04/2009  Job:  [id number]   cc:   Madelin Rear. Sherwood Gambler, MD  Fax: [phone]

## 2011-01-24 NOTE — H&P (Signed)
NAMEMANASI, DISHON NO.:  [id number]   MEDICAL RECORD NO.:  [id number]          PATIENT TYPE:  INP   LOCATION:  A330                          FACILITY:  [facility]   PHYSICIAN:  Melissa L. Ladona Ridgel, MD  DATE OF BIRTH:  31-Jan-1956   DATE OF ADMISSION:  DATE OF DISCHARGE:  LH                              HISTORY & PHYSICAL   ADDENDUM   When I saw Ms. Mckenzie at approximately 1 o'clock today, she was no  longer having chest pain, abdominal pain, or shortness of breath; those  symptoms appear to have resolved.   Her last experience with similar symptoms, February 25, 2009.  She was doing  yard work, felt like she was bitten on the forehead by something,  developed a sore swollen area that over the course of 2-3 weeks  responded to antibacterial wash and steroids.  It has now healed up.   PAST MEDICAL HISTORY:  Significant for emphysema.  She has a history of  pyelonephritis and acute renal failure in 2004 that was induced by a  combination of NSAIDs and Cipro.   PAST SURGICAL HISTORY:  Cholecystectomy, hysterectomy, meniscal tear,  and subsequent scope.  She has had 2 lower back surgeries and 1 cervical  spinal surgery.   CURRENT MEDICATIONS:  1. Premarin 0.5 mg daily.  2. She was put on Bactrim DS yesterday, has only had 1 or 2 doses of      that.  3. She started prednisone from a previous prescription that she had at      home.  She has been taking that for approximately 3 days, she is      unsure of the dosage.   ALLERGIES:  She has allergies to CODEINE in that it causes nightmares.  She has allergies to NSAIDS and CIPRO in that the combination of the two  caused acute renal failure and she appears to have possible sensitivity  to clindamycin in that it appeared to cause increased swelling and  shortness of breath in the ED today.   REVIEW OF SYSTEMS:  GENERAL:  She describes no weight loss, fever, or  diaphoresis.  NEURO:  She describes no dizziness, headache,  or  confusion.  SIGHT:  She has had no changes in her vision.  No double  vision.  BREATHING:  She did have a limited episode of shortness of  breath today.  It appears to have resolved.  She is not having any  difficulties breathing now and she reports no difficulties breathing in  the last month or so.  CARDIOVASCULAR:  She describes no palpitations.  She did have a very limited episode of chest pain here in the hospital  that appears to have resolved.  ABDOMEN:  She describes no anorexia, no  vomiting, no abdominal pain prior to the very limited episode of  abdominal pain she had here in the ED which has now resolved.  She has  no issues with urination.  She tells me that her bowels are chronically  slow, but she has no other known GI issues.  ORTHOPEDICS:  She does have  a history of back surgery but is not currently having any joint pain or  back problems.  SKIN:  Other than the acute pain and swelling in her  right cheek, she has no lesions or rashes or bruising anywhere else on  her body.   SOCIAL HISTORY:  This patient was a previous smoker.  She quit smoking 3-  4 months ago.  She has never used alcohol or drugs.   FAMILY HISTORY:  Significant for her mother with emphysema, diabetes  mellitus, and coronary artery disease.  Her father passed away with  throat cancer.   LABORATORY DATA:  Significant for a white count of 12.7, hemoglobin  13.9, hematocrit 39.4, and platelets 355,000.  BMET is within normal  limits other than a potassium of 3.2.  Her BUN is 10 and creatinine is  0.67.  She has had 2 sets of cardiac enzymes, both have been negative.   PHYSICAL EXAMINATION:  GENERAL:  She is a well-developed, well-nourished  Caucasian female who appears to be in good condition other than her  face.  She is alert and oriented, in no apparent distress.  HEENT:  Her head is normocephalic.  Her right cheek is swollen with a  central area of raised erythema that has an annular  ulceration  approximately 1.5 to 2 cm in diameter but has white granulation tissue  covering it.  The right side of her face and jaw are very tender to  palpation.  The right side of her lips are drooping secondary to the  swelling.  Her mouth is without lesions or ulcerations.  She has no  erythema in her oropharynx although she does tell me that it is painful  to swallow.  Her ears are without external lesions.  Her TMs are  visualized.  They look gray and healthy.  Her eyelids are currently  swollen and pink.  Sclerae are clear.  NECK:  Supple.  Trachea is midline.  No lymphadenopathy is appreciated.  SKIN:  Without rash or bruising other than the lesion described above.  HEART:  Regular rate and rhythm with no murmurs, rubs, or gallops.  LUNGS:  Clear to auscultation bilaterally with no wheezes or crackles.  ABDOMEN:  Soft, nontender, and nondistended with good bowel sounds.  LOWER EXTREMITIES:  No edema.  They are warm and dry and have 2+ pulses  bilaterally.  VITAL SIGNS:  Currently pulse 60, blood pressure 111/70, temperature  98.1, and respirations 20.   RADIOLOGICAL EXAMINATION:  The CT of her face has been ordered.   ASSESSMENT:  Dr. Derenda Mis has seen and examined the patient,  collected her history, and reviewed her chart.  Her impression is that  this is a 55 year old female with history of emphysema, in generally  good health, who appears to have an infection ulceration on her face  that could possibly be methicillin-resistant Staphylococcus aureus, is  also apparently having an allergic reaction to a medication or toxin  release.  She had a limited episode of chest pain and shortness of  breath, which have now resolved.  Her cardiac enzymes and EKG are  negative.   PLAN:  We will admit her to monitor her secondary to the chest pain and  shortness of breath.  We will get a CT of her face secondary to the  cellulitis.  We will start vancomycin.      Stephani Police, PA      Melissa L. Ladona Ridgel, MD  Electronically Signed    MLY/MEDQ  D:  04/01/2009  T:  04/02/2009  Job:  [id number]   cc:   Madelin Rear. Sherwood Gambler, MD  Fax: [phone]

## 2011-01-24 NOTE — Group Therapy Note (Signed)
Gina Wolfe, Gina Wolfe NO.:  [id number]   MEDICAL RECORD NO.:  [id number]          PATIENT TYPE:  INP   LOCATION:  A330                          FACILITY:  [facility]   PHYSICIAN:  Melissa L. Ladona Ridgel, MD  DATE OF BIRTH:  [birth year]   DATE OF PROCEDURE:  04/02/2009  DATE OF DISCHARGE:                                 PROGRESS NOTE   Please note that the patient was seen in conjunction with our  physician's assistant.  Please take into consideration our written as  well as dictated summary.   Subjectively, the patient states that she is noticing a little bit of  improvement in her face.  She still has significant pain in her cheek  and a little bit of fullness when she swallows but no obvious dysphagia.  She denies nausea, vomiting, diarrhea.  She had no allergic reaction to  the Zosyn which was added yesterday.   T-max was 97.9.  This morning she is 98.1, blood pressure 95/66, pulse  52, respirations 20, saturation 97%.  She has had reasonable urine  output and no reported or documented stools.   PHYSICAL EXAMINATION:  GENERAL:  The patient is a pleasant white female  in no acute distress.  HEENT:  She is normocephalic, atraumatic.  Pupils are equal, round and  reactive to light.  She still has a little bit of puffiness around her  periorbital area, but no cellulitic changes are noted.  The right facial  wound remains with a central area of phlegmon and necrosis without  evidence for drainage at this time.  There is some regression of the  area of induration with decreased edema in the face and neck.  She still  has a little bit of fullness in the submandibular area but nothing  compared to admission.  The patient has limited ability to open her  mouth secondary to pain, and as stated she does have some fullness with  swallowing but no obvious dysphagia.  NECK:  Supple and just slight edema in the right side of the neck;  still, it has regressed a little bit from  across the midline but not  much.  CHEST:  Clear to auscultation.  There are no rhonchi, rales or wheezes.  CARDIOVASCULAR:  Regular rate and rhythm.  Positive S1 and S2.  No S3 or  S4.  I do not appreciate a murmur, rub or gallop.  ABDOMEN:  Mildly obese.  Soft, nontender, nondistended with positive  bowel sounds.  There is no hepatosplenomegaly.  No guarding or rebound.  EXTREMITIES:  Show no clubbing, cyanosis or edema.  NEUROLOGICAL:  She is awake, alert, and oriented.  Cranial nerves II-XII  are intact.  Power is 5/5.  DTRs 2+.  Plantars are downgoing.   PERTINENT LABORATORIES:  The patient's wound culture not showing any  organisms.  Sodium was 138, potassium 4.3, chloride 106, CO2 of 29, BUN  is 5, creatinine 0.7 and glucose is 95.  Her white count reveals a WBC  count of 11.6, which is coming down.  Her hemoglobin is 11.9, hematocrit  34.5, and platelets of 318.  Her D-dimer is 0.22, and her cardiac  markers have been negative.   ASSESSMENT/PLAN:  The patient is a 55 year old white female who has  developed an abscess on her right cheek.  Initially it was thought to  have started as an insect bite.  There is concern that there may be a  methicillin-resistant Staph aureus origin to this based on the nature of  the aggressive and acute onset.  The patient therefore is being treated  with vancomycin as well as Zosyn.   She does have some improvement in the pain and swelling, and we will  continue to follow.  Surgery has been following along to ensure that  there is no need for an extension of the wound in order to cause  drainage.   1. Facial abscess.  CT scan shows no obvious involvement in the deep      planes.  We will therefore continue her antibiotic therapy and      monitor her, following closely as she still has a sense of fullness      when she swallows.  Day number 2 of antibiotic therapy on Zosyn and      vancomycin.  2. Chest pain.  This was a fleeting sensation of  chest discomfort      during an anaphylactic reaction in the emergency room that has      resolved, and she has no obvious cardiac source for this      discomfort.  3. Anaphylaxis.  The patient is recovered from this.  We have added      clindamycin to her drug allergy list, and she does not appear to be      having any reaction to the current medications that she is on.   Disposition will be home once the patient has had adequate IV antibiotic  therapy to cause penetration and improvement in her current facial  abscess.  I would not be quick to discharge her to home until a fair  amount progression of the current phlegmon is documented as she could  regress quite quickly.   Total time on this case is 30 minutes.      Melissa L. Ladona Ridgel, MD  Electronically Signed     MLT/MEDQ  D:  04/03/2009  T:  04/03/2009  Job:  [id number]

## 2011-01-27 NOTE — Discharge Summary (Signed)
Gina, Wolfe NO.:  [id number]   MEDICAL RECORD NO.:  [id number]          PATIENT TYPE:  INP   LOCATION:  A309                          FACILITY:  [facility]   PHYSICIAN:  Margaretmary Dys, M.D.DATE OF BIRTH:  10-Feb-1956   DATE OF ADMISSION:  12/08/2008  DATE OF DISCHARGE:  04/04/2010LH                               DISCHARGE SUMMARY   DISCHARGE DIAGNOSES:  1. Acute viral gastroenteritis.  2. Mild chronic obstructive pulmonary disease exacerbation.  3. History of bronchial asthma.  4. Incidental finding of lung nodule measuring 4 mm x 5 mm x 4 mm in      the lingular area.   DISCHARGE MEDICATIONS:  Albuterol inhaler two puffs q.i.d. p.r.n.   DIET:  Regular diet.   ACTIVITY:  As tolerated.   SPECIAL INSTRUCTIONS:  Extensive smoking cessation.  Counseling was  provided to the patient during the course of hospitalization.   HOSPITAL COURSE:  Ms. Gina Wolfe is a 55 year old female who was  admitted to the hospital with symptoms of what appeared to be acute  viral gastroenteritis.  The patient apparently developed some  lightheadedness, shortness of breath and chest pressure.  She has had  some nausea, vomiting and also diarrhea which occurred when she was  [place] out of state.   She denies any significant cough or shortness of breath.  Her biggest  complaint was her nausea and vomiting with diarrhea with some abdominal  cramping.   PHYSICAL EXAMINATION:  The patient's blood pressure was slightly low at  about 95/52, respiration was 16, her saturations were 96%, her  temperature was 98.2.  The rest of her physical examination was  unremarkable.   The patient was then admitted for hydration.  The patient received some  fluid therapy.   A CT scan done for her advanced COPD showed the incidental lung nodule  as mentioned above which will need a follow-up scan in about 3 months.   The patient was also placed on inhalers during the course of the  hospitalization.  The patient did not have any evidence suggesting COPD  exacerbation.  The patient did very well with complete resolution of her  nausea and vomiting.  Her blood pressure also improved after hydration.  The patient was then discharged home in satisfactory condition.   DISPOSITION:  To home.   PERTINENT LABORATORY DATA DURING THE COURSE OF HOSPITALIZATION:  Urinalysis was negative.  Cardiac enzymes were negative.  White blood  cell count was 10.8, hemoglobin was 14.5, hematocrit was 41.5, platelet  count was 240.  D-dimer was 0.4.  Lipase of 18, sodium 142, potassium  was 3.7, chloride 108, CO2 was 21, BUN of 4, creatinine was 0.7, glucose  101.  EKG shows poor baseline; otherwise, no acute ST-T changes were  noted, and it was mostly normal sinus rhythm.  Please review Dr.  Lubertha Basque detailed history and physical for more information.      Margaretmary Dys, M.D.  Electronically Signed     AM/MEDQ  D:  12/30/2008  T:  12/30/2008  Job:  [id number]

## 2011-01-27 NOTE — H&P (Signed)
NAME:  Gina Wolfe, Gina Wolfe NO.:  [id number]   MEDICAL RECORD NO.:  [id number]                   PATIENT TYPE:  INP   LOCATION:  A307                                 FACILITY:  [facility]   PHYSICIAN:  Madelin Rear. Sherwood Gambler, M.D.             DATE OF BIRTH:  [birth year]   DATE OF ADMISSION:  04/10/2003  DATE OF DISCHARGE:                                HISTORY & PHYSICAL   CHIEF COMPLAINT:  Back pain.   HISTORY OF PRESENT ILLNESS:  The patient is evaluated in the emergency  department for back pain that started 1 day prior to ER visitation (March 31, 2003).  She was recently on antibiotics for a nasal lacrimal abscess that  was drained by ophthalmology, and apparently resolved; well-healed,  asymptomatic.  She also complained of gross hematuria associated with this  back pain. This was followed by episodes of vomiting. She denied  hematemesis, hematochezia or melena.  She had crampy, bilateral flank pain,  mostly left-sided, but also crossed into the right side and radiated into  the bilateral lower pelvic areas.  It was of moderate intensity.  It was not  made worse or ameliorated by any maneuvers or food intake.   PAST MEDICAL HISTORY:  Status post cholecystectomy, status post partial  hysterectomy with expansive ovaries.  She had a recent episode of pelvic  pain that was worked up and found to be negative by ultrasonography on  physical exam and resolved.   SOCIAL HISTORY:  No alcohol use, positive smoking, no drug use.   FAMILY HISTORY:  Noncontributory.   REVIEW OF SYSTEMS:  As outlined under HPI.  All else is negative in detail.   PHYSICAL EXAMINATION:  SKIN: Unremarkable.  HEAD AND NECK:  No JVD or adenopathy.  Neck was supple.  CHEST:  Clear.  CARDIAC:  Regular rhythm without murmur, gallop, or rub.  ABDOMEN:  Soft.  No organomegaly or masses.  No guarding or rebound  tenderness.  EXTREMITIES:  Without clubbing, cyanosis, or edema.  NEUROLOGIC:  Examination is nonfocal.   Patient initially was planned for discharge from the emergency department;  however, there were strong familial objections which resulted in me being  called and admitting the patient for observation and further workup.   LABORATORIES:  CBC revealed 11.2 white count without left shift.  Platelet  count was normal.  H&H was also normal.  Electrolytes revealed no acute  abnormalities. Urinalysis showed marked pyuria 11-20 white cells per high-  powered field and only 3-6 red cells per high-powered field.   IMPRESSION:  1. Back pain and hematuria.  2. Apparently the patient has pyelonephritis unresponsive to existing Cipro     therapy.  Urine culture will be obtained.  CT scan will be obtained to     rule out perinephric abscess or other intra-abdominal pathology not     appreciated on physical examination.  DIFFERENTIAL DIAGNOSES POSSIBILITIES:  Renal infarction, perinephric  abscess, pyelonephritis, ureterolith, pelvic pathology including ovarian  cyst, abscess, endometriosis, diverticulitis.  IV antibiotics to be  continued in house, parenteral analgesic pending results of CT.                                               Madelin Rear. Sherwood Gambler, M.D.    LJF/MEDQ  D:  04/11/2003  T:  04/11/2003  Job:  [id number]

## 2011-01-27 NOTE — Discharge Summary (Signed)
   NAME:  Gina Wolfe, Gina Wolfe NO.:  [id number]   MEDICAL RECORD NO.:  [id number]                   PATIENT TYPE:  INP   LOCATION:  A307                                 FACILITY:  [facility]   PHYSICIAN:  Madelin Rear. Sherwood Gambler, M.D.             DATE OF BIRTH:  12-14-1955   DATE OF ADMISSION:  04/10/2003  DATE OF DISCHARGE:  04/15/2003                                 DISCHARGE SUMMARY   DISCHARGE DIAGNOSES:  1. Nonsteroidal induced renal failure.  2. Abdominal pain.  3. Pyelonephritis.   DISCHARGE MEDICATIONS:  Levaquin 500 mg every day.   SUMMARY:  The patient was admitted with severe unrelenting flank and  abdominal pain.  She was seen in the emergency department requiring narcotic  analgesics for control.  She also had vomiting present.  A subsequent CT of  the abdomen and pelvis was performed which revealed mild fullness of the  intrarenal collecting system with associated perinephric stranding and  minimal stranding seen around the left  kidney.  Her pelvic CT revealed  prominent right adnexal tissue with recommendation of further imaging by  ultrasonography.  A neoplasm could not be excluded by CT .  The patient was  noted to have an increase in creatinine on serial assessment of  electrolytes, felt to be secondary to nonsteroidal use as well as mild  dehydration.  Hydration was accelerated and she was seen in consultation by  nephrology with improvement in her metabolic profile.  Her pain resolved.  An ultrasound is pending at the present time and she will followup with Dr.  Kristian Covey from a renal standpoint as an outpatient.                                               Madelin Rear. Sherwood Gambler, M.D.    LJF/MEDQ  D:  04/21/2003  T:  04/21/2003  Job:  [id number]

## 2011-01-27 NOTE — Consult Note (Signed)
NAME:  Gina Wolfe, Gina Wolfe                            ACCOUNT NO.:  [id number]   MEDICAL RECORD NO.:  [id number]                   PATIENT TYPE:  INP   LOCATION:  A307                                 FACILITY:  [facility]   PHYSICIAN:  Jorja Loa, M.D.             DATE OF BIRTH:  1956-04-20   DATE OF CONSULTATION:  04/13/2003  DATE OF DISCHARGE:                                   CONSULTATION   REASON FOR CONSULT:  Renal insufficiency.   Ms. Lindseth is a 55 year old Caucasian female with past medical history of  back pain status post surgery, presently admitted because of severe back  pain and also history of gross hematuria.  Ms. Tugwell states that about 15  days ago she had infected cyst on her eye which was drained and put on  antibiotics - Augmentin for four days, then switched to Cipro, received the  antibiotics for 10 days.  However, yesterday when she came to the emergency  room with a complaints of severe back pain with radiation to her groin.  She  states that she has once in a while back pain and she has been taking  medication such as Advil for many years.  However, this pain seems to be  very severe.  The patient also states that she was found to have some  hematuria.  She denies any previous history of urinary tract infection,  history of kidney stone, and no other medical problems.   PAST MEDICAL HISTORY:  As stated above, the patient with history of back  pain, history of back surgery.  She is status post cholecystectomy, status  post partial hysterectomy, and a history of, as stated above, lacrimal gland  cyst infection treated with antibiotics.  No history of diabetes.  No  history of hypertension, and no history of renal insufficiency.   SOCIAL HISTORY:  No history of smoking or alcohol abuse.   ALLERGIES:  CODEINE and HYDROCODONE.   MEDICATIONS:  1. Rocephin 1 gram IV q.24h.  2. Tylenol.  3. Xanax 0.25 mg p.o. q.6h.  4. Colace 100 mg p.o. daily.  5. Imodium 2  mg p.r.n.  6. Ambien 10 mg p.o. daily.  7. Darvocet one p.r.n.  8. __________.   REVIEW OF SYSTEMS:  The back pain seems to be better.  She denies any fever,  chills, or sweating.  She had some nausea when she was taking the Cipro; she  finished it before she came.  She had an episode of also some diarrhea and  abdominal upset.  Mainly back pain with radiation to the groin area, feels  better.  She also states that she has some hematuria.  She denies any  swelling of her legs.   PHYSICAL EXAMINATION:  VITAL SIGNS:  Temperature 98.2, blood pressure  106/68, pulse 62.  HEENT:  No conjunctival pallor, nonicteric.  Oral mucosa seems to be  somewhat dry.  NECK:  Supple.  No JVD.  CHEST:  Clear to auscultation.  No rales, no rhonchi, no egophony.  HEART:  Reveals regular rate and rhythm, no murmur.  ABDOMEN:  Soft, positive bowel sounds.  EXTREMITIES:  No edema.   LABORATORY DATA:  Her intake is 1500, output 2700, with 1100 negative fluid  balance.  She has a CAT scan which was done because of the pain.  There is  no evidence of calculus or stone and the only thing is there is mild  fullness of the right internal collecting system; otherwise, no other  abnormalities detected on the CAT scan.  Her blood work shows her white  blood cell count is 9, when she came it was 11.3; hemoglobin 12.5,  hematocrit 36.7, eosinophils 0.2, platelets 296.  Sodium today is 138; when  she came it was 134, BUN 12, creatinine 2.2.  Today, BUN is 13, creatinine  2.5.  Yesterday creatinine was 3.2.  Her urine was cloudy when she came,  specific gravity was 1.025.  She had 13 mg of protein, white blood cell 11-  20, bacteria was rare.  She had red blood cells 3-6.   ASSESSMENT:  Acute renal insufficiency.  At this moment, etiology not clear.  This is associated with back pain, hematuria.  Not sure whether the patient  has passed a kidney stone.  However, in a patient who has a history of  longstanding back  pain who has been taking nonsteroidal for a long period  with hyponatremia, nonsteroidal-related nephropathy cannot be ruled out.  However, at this moment the patient's blood pressure is reasonable and she  does not have also significant proteinuria.  Last but not least, in a  patient has been on antibiotics such as Cipro interstitial nephritis should  be a consideration, but at this moment the patient does not have any  eosinophilia, even though that does not rule out the possibility.  However,  since her creatinine seems to be improving the patient might have an acute  insult to her kidney which seems to be recovering.   RECOMMENDATIONS:  Will increase her hydration.  Will probably follow her UA  and if the patient's BUN and creatinine progressively improve and if the UA  did not show any protein, possibly will stop the workup.  Otherwise, we may  need to do ANA complement and other studies to find out the possible reason  for proteinuria and renal insufficiency.  At this moment the patient also  advised to avoid any type of nonsteroidal.  If her BUN and creatinine remain  high we may need to do an ultrasound after hydrating the patient.                                               Jorja Loa, M.D.    BB/MEDQ  D:  04/13/2003  T:  04/13/2003  Job:  [id number]

## 2011-05-03 ENCOUNTER — Other Ambulatory Visit (HOSPITAL_COMMUNITY): Payer: Self-pay | Admitting: Internal Medicine

## 2011-05-03 DIAGNOSIS — Z139 Encounter for screening, unspecified: Secondary | ICD-10-CM

## 2011-05-04 ENCOUNTER — Ambulatory Visit (HOSPITAL_COMMUNITY): Payer: Self-pay

## 2011-06-09 ENCOUNTER — Other Ambulatory Visit: Payer: Self-pay | Admitting: Internal Medicine

## 2011-06-12 NOTE — Telephone Encounter (Signed)
Pt needs f/u appt from [place]. EGD/colon last year, no f/u that I can see. Will refill 1.

## 2011-06-12 NOTE — Telephone Encounter (Signed)
Needs f/u appt with [place]. Never followed up from what I can tell after EGD/colon last year.

## 2011-06-13 ENCOUNTER — Encounter: Payer: Self-pay | Admitting: Internal Medicine

## 2011-06-13 NOTE — Telephone Encounter (Signed)
Reminder letter sent to patient to call me and set up her next OV

## 2012-01-24 ENCOUNTER — Other Ambulatory Visit (HOSPITAL_COMMUNITY): Payer: Self-pay | Admitting: Internal Medicine

## 2012-01-24 DIAGNOSIS — G542 Cervical root disorders, not elsewhere classified: Secondary | ICD-10-CM

## 2012-01-26 ENCOUNTER — Ambulatory Visit (HOSPITAL_COMMUNITY)
Admission: RE | Admit: 2012-01-26 | Discharge: 2012-01-26 | Disposition: A | Discharge status: Home / Self Care | Payer: BC Managed Care – PPO | Source: Ambulatory Visit | Attending: Internal Medicine | Admitting: Internal Medicine

## 2012-01-26 DIAGNOSIS — M5030 Other cervical disc degeneration, unspecified cervical region: Secondary | ICD-10-CM | POA: Insufficient documentation

## 2012-01-26 DIAGNOSIS — M5380 Other specified dorsopathies, site unspecified: Secondary | ICD-10-CM | POA: Insufficient documentation

## 2012-01-26 DIAGNOSIS — R209 Unspecified disturbances of skin sensation: Secondary | ICD-10-CM | POA: Insufficient documentation

## 2012-01-26 DIAGNOSIS — M542 Cervicalgia: Secondary | ICD-10-CM | POA: Insufficient documentation

## 2012-01-26 DIAGNOSIS — G542 Cervical root disorders, not elsewhere classified: Secondary | ICD-10-CM

## 2012-01-26 DIAGNOSIS — M25519 Pain in unspecified shoulder: Secondary | ICD-10-CM | POA: Insufficient documentation

## 2012-02-09 ENCOUNTER — Other Ambulatory Visit (HOSPITAL_COMMUNITY): Payer: Self-pay | Admitting: Orthopedic Surgery

## 2012-02-09 DIAGNOSIS — M542 Cervicalgia: Secondary | ICD-10-CM

## 2012-02-14 ENCOUNTER — Ambulatory Visit (HOSPITAL_COMMUNITY)
Admission: RE | Admit: 2012-02-14 | Discharge: 2012-02-14 | Disposition: A | Discharge status: Home / Self Care | Payer: BC Managed Care – PPO | Source: Ambulatory Visit | Attending: Orthopedic Surgery | Admitting: Orthopedic Surgery

## 2012-02-14 DIAGNOSIS — Z981 Arthrodesis status: Secondary | ICD-10-CM | POA: Insufficient documentation

## 2012-02-14 DIAGNOSIS — M542 Cervicalgia: Secondary | ICD-10-CM | POA: Insufficient documentation

## 2013-03-26 ENCOUNTER — Other Ambulatory Visit: Payer: Self-pay | Admitting: Orthopedic Surgery

## 2013-03-26 ENCOUNTER — Encounter (HOSPITAL_COMMUNITY): Payer: Self-pay | Admitting: Pharmacy Technician

## 2013-04-03 ENCOUNTER — Other Ambulatory Visit (HOSPITAL_COMMUNITY): Payer: BC Managed Care – PPO

## 2013-04-04 ENCOUNTER — Ambulatory Visit (HOSPITAL_COMMUNITY)
Admission: RE | Admit: 2013-04-04 | Discharge: 2013-04-04 | Disposition: A | Discharge status: Home / Self Care | Payer: BC Managed Care – PPO | Source: Ambulatory Visit | Attending: Orthopedic Surgery | Admitting: Orthopedic Surgery

## 2013-04-04 ENCOUNTER — Encounter (HOSPITAL_COMMUNITY)
Admission: RE | Admit: 2013-04-04 | Discharge: 2013-04-04 | Disposition: A | Discharge status: Home / Self Care | Payer: BC Managed Care – PPO | Source: Ambulatory Visit | Attending: Orthopedic Surgery | Admitting: Orthopedic Surgery

## 2013-04-04 ENCOUNTER — Encounter (HOSPITAL_COMMUNITY): Payer: Self-pay

## 2013-04-04 DIAGNOSIS — Z0183 Encounter for blood typing: Secondary | ICD-10-CM | POA: Insufficient documentation

## 2013-04-04 DIAGNOSIS — Z9089 Acquired absence of other organs: Secondary | ICD-10-CM | POA: Insufficient documentation

## 2013-04-04 DIAGNOSIS — Z01810 Encounter for preprocedural cardiovascular examination: Secondary | ICD-10-CM | POA: Insufficient documentation

## 2013-04-04 DIAGNOSIS — Z01812 Encounter for preprocedural laboratory examination: Secondary | ICD-10-CM | POA: Insufficient documentation

## 2013-04-04 HISTORY — DX: Other specified postprocedural states: Z98.890

## 2013-04-04 HISTORY — DX: Cardiac murmur, unspecified: R01.1

## 2013-04-04 HISTORY — DX: Anxiety disorder, unspecified: F41.9

## 2013-04-04 HISTORY — DX: Irritable bowel syndrome, unspecified: K58.9

## 2013-04-04 HISTORY — DX: Unspecified asthma, uncomplicated: J45.909

## 2013-04-04 HISTORY — DX: Unspecified osteoarthritis, unspecified site: M19.90

## 2013-04-04 HISTORY — DX: Gastro-esophageal reflux disease without esophagitis: K21.9

## 2013-04-04 HISTORY — DX: Other reaction to spinal and lumbar puncture: G97.1

## 2013-04-04 HISTORY — DX: Diverticulitis of intestine, part unspecified, without perforation or abscess without bleeding: K57.92

## 2013-04-04 HISTORY — DX: Nausea with vomiting, unspecified: R11.2

## 2013-04-04 LAB — COMPREHENSIVE METABOLIC PANEL
ALT: 18 U/L (ref 0–35)
BUN: 9 mg/dL (ref 6–23)
Calcium: 9 mg/dL (ref 8.4–10.5)
Creatinine, Ser: 0.62 mg/dL (ref 0.50–1.10)
GFR calc Af Amer: >90 mL/min (ref >90)
Glucose, Bld: 84 mg/dL (ref 70–99)
Sodium: 137 mEq/L (ref 135–145)
Total Protein: 6.8 g/dL (ref 6.0–8.3)

## 2013-04-04 LAB — URINALYSIS, ROUTINE W REFLEX MICROSCOPIC
Bilirubin Urine: NEGATIVE
Hgb urine dipstick: NEGATIVE
Protein, ur: NEGATIVE mg/dL
Urobilinogen, UA: 1 mg/dL (ref 0.0–1.0)

## 2013-04-04 LAB — TYPE AND SCREEN
ABO/RH(D): A POS
Antibody Screen: NEGATIVE

## 2013-04-04 LAB — PROTIME-INR
INR: 0.95 (ref 0.00–1.49)
Prothrombin Time: 12.5 seconds (ref 11.6–15.2)

## 2013-04-04 LAB — CBC WITH DIFFERENTIAL/PLATELET
Eosinophils Absolute: 0.2 10*3/uL (ref 0.0–0.7)
Eosinophils Relative: 2 % (ref 0–5)
Lymphs Abs: 3.2 10*3/uL (ref 0.7–4.0)
MCH: 30.5 pg (ref 26.0–34.0)
MCV: 92.1 fL (ref 78.0–100.0)
Platelets: 268 10*3/uL (ref 150–400)
RBC: 4.3 MIL/uL (ref 3.87–5.11)

## 2013-04-04 LAB — SURGICAL PCR SCREEN: MRSA, PCR: NEGATIVE

## 2013-04-04 NOTE — Progress Notes (Signed)
Pt denies SOB, chest pain, and being under the care of a cardiologist. Pt denies having an EKG, chest x ray , stress test, echo, and cardiac cath. 

## 2013-04-04 NOTE — Pre-Procedure Instructions (Signed)
Gina Wolfe  04/04/2013   Your procedure is scheduled on:  Thursday, April 10, 2013  Report to [facility] at 7:00 AM.  Call this number if you have problems the morning of surgery: [phone]   Remember:   Do not eat food or drink liquids after midnight.   Take these medicines the morning of surgery with A SIP OF WATER: estrogens, conjugated, (PREMARIN) 0.625 MG tablet  If needed:ALPRAZolam (XANAX) 1 MG tablet for anxiety   Stop taking Aspirin and herbal medications. Do not take any NSAIDs ie: Ibuprofen, Advil, Motrin Naproxen or any  medication containing Aspirin.   o not wear jewelry, make-up or nail polish.  Do not wear lotions, powders, or perfumes. You may wear deodorant.  Do not shave 48 hours prior to surgery.  Do not bring valuables to the hospital.  [facility] is not responsible for any belongings or valuables.  Contacts, dentures or bridgework may not be worn into surgery.  Leave suitcase in the car. After surgery it may be brought to your room.  For patients admitted to the hospital, checkout time is 11:00 AM the day of discharge.   Patients discharged the day of surgery will not be allowed to drive home.  Name and phone number of your driver:   Special Instructions: Shower using CHG 2 nights before surgery and the night before surgery.  If you shower the day of surgery use CHG.  Use special wash - you have one bottle of CHG for all showers.  You should use approximately 1/3 of the bottle for each shower.   Please read over the following fact sheets that you were given: Pain Booklet, Coughing and Deep Breathing, Blood Transfusion Information, MRSA Information and Surgical Site Infection Prevention

## 2013-04-09 MED ORDER — POVIDONE-IODINE 7.5 % EX SOLN
Freq: Once | CUTANEOUS | Status: DC
  Filled 2013-04-09: qty 118

## 2013-04-09 MED ORDER — CEFAZOLIN SODIUM-DEXTROSE 2-3 GM-% IV SOLR
2.0000 g | INTRAVENOUS | Status: AC
  Administered 2013-04-10: 2 g via INTRAVENOUS
  Filled 2013-04-09: qty 50

## 2013-04-10 ENCOUNTER — Inpatient Hospital Stay (HOSPITAL_COMMUNITY): Payer: BC Managed Care – PPO

## 2013-04-10 ENCOUNTER — Encounter (HOSPITAL_COMMUNITY): Payer: Self-pay | Admitting: Anesthesiology

## 2013-04-10 ENCOUNTER — Ambulatory Visit (HOSPITAL_COMMUNITY): Payer: BC Managed Care – PPO | Admitting: Anesthesiology

## 2013-04-10 ENCOUNTER — Ambulatory Visit (HOSPITAL_COMMUNITY)
Admission: RE | Admit: 2013-04-10 | Discharge: 2013-04-11 | DRG: 865 | Disposition: A | Discharge status: Home / Self Care | Payer: BC Managed Care – PPO | Source: Ambulatory Visit | Attending: Orthopedic Surgery | Admitting: Orthopedic Surgery

## 2013-04-10 ENCOUNTER — Ambulatory Visit (HOSPITAL_COMMUNITY): Payer: BC Managed Care – PPO

## 2013-04-10 ENCOUNTER — Encounter (HOSPITAL_COMMUNITY): Payer: Self-pay | Admitting: *Deleted

## 2013-04-10 ENCOUNTER — Encounter (HOSPITAL_COMMUNITY)
Admission: RE | Disposition: A | Discharge status: Home / Self Care | Payer: Self-pay | Source: Ambulatory Visit | Attending: Orthopedic Surgery

## 2013-04-10 DIAGNOSIS — T84498A Other mechanical complication of other internal orthopedic devices, implants and grafts, initial encounter: Secondary | ICD-10-CM | POA: Insufficient documentation

## 2013-04-10 DIAGNOSIS — M4802 Spinal stenosis, cervical region: Secondary | ICD-10-CM | POA: Insufficient documentation

## 2013-04-10 DIAGNOSIS — Y832 Surgical operation with anastomosis, bypass or graft as the cause of abnormal reaction of the patient, or of later complication, without mention of misadventure at the time of the procedure: Secondary | ICD-10-CM | POA: Insufficient documentation

## 2013-04-10 DIAGNOSIS — M5030 Other cervical disc degeneration, unspecified cervical region: Secondary | ICD-10-CM | POA: Insufficient documentation

## 2013-04-10 DIAGNOSIS — Z79899 Other long term (current) drug therapy: Secondary | ICD-10-CM | POA: Insufficient documentation

## 2013-04-10 HISTORY — PX: POSTERIOR CERVICAL FUSION/FORAMINOTOMY: SHX5038

## 2013-04-10 SURGERY — POSTERIOR CERVICAL FUSION/FORAMINOTOMY LEVEL 2
Anesthesia: General | Site: Spine Cervical | Wound class: Clean

## 2013-04-10 MED ORDER — BISACODYL 5 MG PO TBEC: 5.0000 mg | DELAYED_RELEASE_TABLET | Freq: Every day | ORAL | Status: DC | PRN

## 2013-04-10 MED ORDER — ONDANSETRON HCL 4 MG/2ML IJ SOLN
4.0000 mg | INTRAMUSCULAR | Status: DC | PRN
  Administered 2013-04-10 (×2): 4 mg via INTRAVENOUS
  Filled 2013-04-10 (×2): qty 2

## 2013-04-10 MED ORDER — GLYCOPYRROLATE 0.2 MG/ML IJ SOLN
INTRAMUSCULAR | Status: DC | PRN
  Administered 2013-04-10: .5 mg via INTRAVENOUS

## 2013-04-10 MED ORDER — HEMOSTATIC AGENTS (NO CHARGE) OPTIME
TOPICAL | Status: DC | PRN
  Administered 2013-04-10: 1 via TOPICAL

## 2013-04-10 MED ORDER — ARTIFICIAL TEARS OP OINT
TOPICAL_OINTMENT | OPHTHALMIC | Status: DC | PRN
  Administered 2013-04-10: 1 via OPHTHALMIC

## 2013-04-10 MED ORDER — PHENOL 1.4 % MT LIQD: 1.0000 | OROMUCOSAL | Status: DC | PRN

## 2013-04-10 MED ORDER — CEFAZOLIN SODIUM 1-5 GM-% IV SOLN
1.0000 g | Freq: Three times a day (TID) | INTRAVENOUS | Status: AC
  Administered 2013-04-10 – 2013-04-11 (×2): 1 g via INTRAVENOUS
  Filled 2013-04-10 (×2): qty 50

## 2013-04-10 MED ORDER — SODIUM CHLORIDE 0.9 % IJ SOLN: 3.0000 mL | INTRAMUSCULAR | Status: DC | PRN

## 2013-04-10 MED ORDER — FLEET ENEMA 7-19 GM/118ML RE ENEM: 1.0000 | ENEMA | Freq: Once | RECTAL | Status: AC | PRN

## 2013-04-10 MED ORDER — PROPOFOL 10 MG/ML IV BOLUS
INTRAVENOUS | Status: DC | PRN
  Administered 2013-04-10: 180 mg via INTRAVENOUS

## 2013-04-10 MED ORDER — BUPIVACAINE-EPINEPHRINE 0.25% -1:200000 IJ SOLN
INTRAMUSCULAR | Status: DC | PRN
  Administered 2013-04-10: 5 mL
  Administered 2013-04-10: 10 mL

## 2013-04-10 MED ORDER — THROMBIN 20000 UNITS EX KIT
PACK | CUTANEOUS | Status: DC | PRN
  Administered 2013-04-10: 20000 [IU] via TOPICAL

## 2013-04-10 MED ORDER — LACTATED RINGERS IV SOLN
INTRAVENOUS | Status: DC
  Administered 2013-04-10: 08:00:00 via INTRAVENOUS

## 2013-04-10 MED ORDER — BUPIVACAINE-EPINEPHRINE PF 0.25-1:200000 % IJ SOLN
INTRAMUSCULAR | Status: AC
  Filled 2013-04-10: qty 30

## 2013-04-10 MED ORDER — LIDOCAINE HCL 4 % MT SOLN
OROMUCOSAL | Status: DC | PRN
  Administered 2013-04-10: 4 mL via TOPICAL

## 2013-04-10 MED ORDER — HYDROMORPHONE HCL PF 1 MG/ML IJ SOLN
INTRAMUSCULAR | Status: AC
  Filled 2013-04-10: qty 1

## 2013-04-10 MED ORDER — PROMETHAZINE HCL 12.5 MG PO TABS: 12.5000 mg | ORAL_TABLET | Freq: Four times a day (QID) | ORAL | Status: DC | PRN

## 2013-04-10 MED ORDER — ALUM & MAG HYDROXIDE-SIMETH 200-200-20 MG/5ML PO SUSP: 30.0000 mL | Freq: Four times a day (QID) | ORAL | Status: DC | PRN

## 2013-04-10 MED ORDER — HYDROMORPHONE HCL PF 1 MG/ML IJ SOLN
0.2500 mg | INTRAMUSCULAR | Status: DC | PRN
  Administered 2013-04-10 (×4): 0.5 mg via INTRAVENOUS

## 2013-04-10 MED ORDER — BACITRACIN ZINC 500 UNIT/GM EX OINT
TOPICAL_OINTMENT | CUTANEOUS | Status: AC
  Filled 2013-04-10: qty 15

## 2013-04-10 MED ORDER — THROMBIN 20000 UNITS EX SOLR
CUTANEOUS | Status: AC
  Filled 2013-04-10: qty 20000

## 2013-04-10 MED ORDER — ONDANSETRON HCL 4 MG/2ML IJ SOLN
INTRAMUSCULAR | Status: AC
  Filled 2013-04-10: qty 2

## 2013-04-10 MED ORDER — ONDANSETRON HCL 4 MG/2ML IJ SOLN
INTRAMUSCULAR | Status: DC | PRN
  Administered 2013-04-10: 4 mg via INTRAVENOUS

## 2013-04-10 MED ORDER — LACTATED RINGERS IV SOLN
INTRAVENOUS | Status: DC | PRN
  Administered 2013-04-10 (×2): via INTRAVENOUS

## 2013-04-10 MED ORDER — DOCUSATE SODIUM 100 MG PO CAPS
100.0000 mg | ORAL_CAPSULE | Freq: Two times a day (BID) | ORAL | Status: DC
  Administered 2013-04-10 – 2013-04-11 (×2): 100 mg via ORAL
  Filled 2013-04-10 (×3): qty 1

## 2013-04-10 MED ORDER — SODIUM CHLORIDE 0.9 % IV SOLN: 250.0000 mL | INTRAVENOUS | Status: DC

## 2013-04-10 MED ORDER — BACITRACIN ZINC 500 UNIT/GM EX OINT
TOPICAL_OINTMENT | CUTANEOUS | Status: DC | PRN
  Administered 2013-04-10: 1 via TOPICAL

## 2013-04-10 MED ORDER — NEOSTIGMINE METHYLSULFATE 1 MG/ML IJ SOLN
INTRAMUSCULAR | Status: DC | PRN
  Administered 2013-04-10: 4 mg via INTRAVENOUS

## 2013-04-10 MED ORDER — MIDAZOLAM HCL 5 MG/5ML IJ SOLN
INTRAMUSCULAR | Status: DC | PRN
  Administered 2013-04-10: 2 mg via INTRAVENOUS

## 2013-04-10 MED ORDER — ALPRAZOLAM 0.5 MG PO TABS: 1.0000 mg | ORAL_TABLET | Freq: Every day | ORAL | Status: DC | PRN

## 2013-04-10 MED ORDER — MIDAZOLAM HCL 2 MG/2ML IJ SOLN: 1.0000 mg | INTRAMUSCULAR | Status: DC | PRN

## 2013-04-10 MED ORDER — OXYCODONE-ACETAMINOPHEN 5-325 MG PO TABS
1.0000 | ORAL_TABLET | ORAL | Status: DC | PRN
  Administered 2013-04-10 – 2013-04-11 (×4): 2 via ORAL
  Filled 2013-04-10 (×4): qty 2

## 2013-04-10 MED ORDER — ROCURONIUM BROMIDE 100 MG/10ML IV SOLN
INTRAVENOUS | Status: DC | PRN
  Administered 2013-04-10: 50 mg via INTRAVENOUS

## 2013-04-10 MED ORDER — EPHEDRINE SULFATE 50 MG/ML IJ SOLN
INTRAMUSCULAR | Status: DC | PRN
  Administered 2013-04-10: 5 mg via INTRAVENOUS

## 2013-04-10 MED ORDER — ESTROGENS CONJUGATED 0.625 MG PO TABS
0.6250 mg | ORAL_TABLET | Freq: Every day | ORAL | Status: DC
  Administered 2013-04-11: 0.625 mg via ORAL
  Filled 2013-04-10 (×3): qty 1

## 2013-04-10 MED ORDER — PROMETHAZINE HCL 25 MG RE SUPP: 12.5000 mg | Freq: Four times a day (QID) | RECTAL | Status: DC | PRN

## 2013-04-10 MED ORDER — DEXAMETHASONE SODIUM PHOSPHATE 10 MG/ML IJ SOLN
INTRAMUSCULAR | Status: DC | PRN
  Administered 2013-04-10: 10 mg via INTRAVENOUS

## 2013-04-10 MED ORDER — DIAZEPAM 5 MG PO TABS
5.0000 mg | ORAL_TABLET | Freq: Four times a day (QID) | ORAL | Status: DC | PRN
  Administered 2013-04-11 (×2): 5 mg via ORAL
  Filled 2013-04-10 (×2): qty 1

## 2013-04-10 MED ORDER — LIDOCAINE HCL (CARDIAC) 20 MG/ML IV SOLN
INTRAVENOUS | Status: DC | PRN
  Administered 2013-04-10: 80 mg via INTRAVENOUS

## 2013-04-10 MED ORDER — DIPHENHYDRAMINE HCL 50 MG/ML IJ SOLN: 12.5000 mg | Freq: Four times a day (QID) | INTRAMUSCULAR | Status: DC | PRN

## 2013-04-10 MED ORDER — ZOLPIDEM TARTRATE 5 MG PO TABS: 5.0000 mg | ORAL_TABLET | Freq: Every evening | ORAL | Status: DC | PRN

## 2013-04-10 MED ORDER — SODIUM CHLORIDE 0.9 % IV SOLN
INTRAVENOUS | Status: DC
  Administered 2013-04-10: 19:00:00 via INTRAVENOUS

## 2013-04-10 MED ORDER — ACETAMINOPHEN 325 MG PO TABS: 650.0000 mg | ORAL_TABLET | ORAL | Status: DC | PRN

## 2013-04-10 MED ORDER — MORPHINE SULFATE 2 MG/ML IJ SOLN
1.0000 mg | INTRAMUSCULAR | Status: DC | PRN
  Administered 2013-04-10: 2 mg via INTRAVENOUS
  Administered 2013-04-11: 1.5 mg via INTRAVENOUS
  Filled 2013-04-10 (×2): qty 1

## 2013-04-10 MED ORDER — FENTANYL CITRATE 0.05 MG/ML IJ SOLN
INTRAMUSCULAR | Status: DC | PRN
  Administered 2013-04-10 (×5): 50 ug via INTRAVENOUS

## 2013-04-10 MED ORDER — FENTANYL CITRATE 0.05 MG/ML IJ SOLN: 50.0000 ug | Freq: Once | INTRAMUSCULAR | Status: DC

## 2013-04-10 MED ORDER — PROMETHAZINE HCL 25 MG/ML IJ SOLN
12.5000 mg | Freq: Four times a day (QID) | INTRAMUSCULAR | Status: DC | PRN
  Administered 2013-04-10: 12.5 mg via INTRAVENOUS
  Filled 2013-04-10: qty 1

## 2013-04-10 MED ORDER — ACETAMINOPHEN 650 MG RE SUPP: 650.0000 mg | RECTAL | Status: DC | PRN

## 2013-04-10 MED ORDER — SODIUM CHLORIDE 0.9 % IJ SOLN
3.0000 mL | Freq: Two times a day (BID) | INTRAMUSCULAR | Status: DC
  Administered 2013-04-11: 3 mL via INTRAVENOUS

## 2013-04-10 MED ORDER — MENTHOL 3 MG MT LOZG: 1.0000 | LOZENGE | OROMUCOSAL | Status: DC | PRN

## 2013-04-10 MED ORDER — VECURONIUM BROMIDE 10 MG IV SOLR
INTRAVENOUS | Status: DC | PRN
  Administered 2013-04-10: 2 mg via INTRAVENOUS
  Administered 2013-04-10: 1 mg via INTRAVENOUS
  Administered 2013-04-10: 2 mg via INTRAVENOUS

## 2013-04-10 MED ORDER — DIPHENHYDRAMINE HCL 25 MG PO CAPS: 25.0000 mg | ORAL_CAPSULE | Freq: Four times a day (QID) | ORAL | Status: DC | PRN

## 2013-04-10 MED ORDER — SENNOSIDES-DOCUSATE SODIUM 8.6-50 MG PO TABS: 1.0000 | ORAL_TABLET | Freq: Every evening | ORAL | Status: DC | PRN

## 2013-04-10 MED ORDER — 0.9 % SODIUM CHLORIDE (POUR BTL) OPTIME
TOPICAL | Status: DC | PRN
  Administered 2013-04-10 (×3): 1000 mL

## 2013-04-10 SURGICAL SUPPLY — 69 items
APL SKNCLS STERI-STRIP NONHPOA (GAUZE/BANDAGES/DRESSINGS) ×1
BENZOIN TINCTURE PRP APPL 2/3 (GAUZE/BANDAGES/DRESSINGS) ×3 IMPLANT
BIT DRILL MOUNTAINEER FIX 14 (BIT) ×1
BIT DRILL MOUNTAINEER FIX 14MM (BIT) IMPLANT
BLADE CLIPPER SURG NEURO (BLADE) IMPLANT
BUR NEURO DRILL SOFT 3.0X3.8M (BURR) ×2 IMPLANT
BUR PRESCISION 1.7 ELITE (BURR) ×2 IMPLANT
CLOTH BEACON ORANGE TIMEOUT ST (SAFETY) ×2 IMPLANT
CONT SPEC STER OR (MISCELLANEOUS) ×2 IMPLANT
CORDS BIPOLAR (ELECTRODE) ×2 IMPLANT
COVER SURGICAL LIGHT HANDLE (MISCELLANEOUS) ×2 IMPLANT
DRAIN CHANNEL 15F RND FF W/TCR (WOUND CARE) IMPLANT
DRAPE C-ARM 42X72 X-RAY (DRAPES) ×1 IMPLANT
DRAPE INCISE IOBAN 66X45 STRL (DRAPES) ×2 IMPLANT
DRAPE PED LAPAROTOMY (DRAPES) ×1 IMPLANT
DRAPE POUCH INSTRU U-SHP 10X18 (DRAPES) ×2 IMPLANT
DRAPE PROXIMA HALF (DRAPES) ×6 IMPLANT
DRAPE SURG 17X23 STRL (DRAPES) ×14 IMPLANT
DRAPE TABLE COVER HEAVY DUTY (DRAPES) ×2 IMPLANT
DRILL BIT MOUNTAINEER FIX 14MM (BIT) ×2
DRSG MEPILEX BORDER 4X8 (GAUZE/BANDAGES/DRESSINGS) ×2 IMPLANT
DURAPREP 26ML APPLICATOR (WOUND CARE) ×2 IMPLANT
ELECT CAUTERY BLADE 6.4 (BLADE) ×2 IMPLANT
ELECT REM PT RETURN 9FT ADLT (ELECTROSURGICAL) ×2
ELECTRODE REM PT RTRN 9FT ADLT (ELECTROSURGICAL) ×1 IMPLANT
EVACUATOR SILICONE 100CC (DRAIN) IMPLANT
GAUZE SPONGE 4X4 16PLY XRAY LF (GAUZE/BANDAGES/DRESSINGS) ×2 IMPLANT
GLOVE BIO SURGEON STRL SZ7 (GLOVE) ×3 IMPLANT
GLOVE BIO SURGEON STRL SZ8 (GLOVE) ×4 IMPLANT
GLOVE BIOGEL PI IND STRL 8 (GLOVE) ×1 IMPLANT
GLOVE BIOGEL PI INDICATOR 8 (GLOVE) ×1
GOWN STRL NON-REIN LRG LVL3 (GOWN DISPOSABLE) ×4 IMPLANT
GOWN STRL REIN XL XLG (GOWN DISPOSABLE) ×1 IMPLANT
IV CATH 14GX2 1/4 (CATHETERS) ×2 IMPLANT
KIT BASIN OR (CUSTOM PROCEDURE TRAY) ×2 IMPLANT
KIT ROOM TURNOVER OR (KITS) ×2 IMPLANT
NDL HYPO 25GX1X1/2 BEV (NEEDLE) ×1 IMPLANT
NEEDLE HYPO 25GX1X1/2 BEV (NEEDLE) ×2 IMPLANT
NS IRRIG 1000ML POUR BTL (IV SOLUTION) ×2 IMPLANT
PACK LAMINECTOMY ORTHO (CUSTOM PROCEDURE TRAY) ×2 IMPLANT
PAD ARMBOARD 7.5X6 YLW CONV (MISCELLANEOUS) ×4 IMPLANT
PATTIES SURGICAL .5 X.5 (GAUZE/BANDAGES/DRESSINGS) ×2 IMPLANT
PIN MAYFIELD SKULL DISP (PIN) ×2 IMPLANT
PUTTY BONE DBX 5CC MIX (Putty) ×1 IMPLANT
ROD MOUNTAINEER 120MM (Rod) ×1 IMPLANT
SCREW F A 3.5X12 (Screw) ×2 IMPLANT
SCREW F A 3.5X14 (Screw) ×1 IMPLANT
SCREW FA MOUNTAINEER 22MM (Screw) ×2 IMPLANT
SCREW INNER (Screw) ×4 IMPLANT
SPONGE GAUZE 4X4 12PLY (GAUZE/BANDAGES/DRESSINGS) ×2 IMPLANT
SPONGE INTESTINAL PEANUT (DISPOSABLE) ×1 IMPLANT
SPONGE SURGIFOAM ABS GEL 100 (HEMOSTASIS) ×2 IMPLANT
STRIP CLOSURE SKIN 1/2X4 (GAUZE/BANDAGES/DRESSINGS) ×1 IMPLANT
SURGIFLO TRUKIT (HEMOSTASIS) IMPLANT
SUT MNCRL AB 4-0 PS2 18 (SUTURE) ×2 IMPLANT
SUT VIC AB 0 CT1 18XCR BRD 8 (SUTURE) ×1 IMPLANT
SUT VIC AB 0 CT1 8-18 (SUTURE) ×2
SUT VIC AB 1 CT1 18XCR BRD 8 (SUTURE) ×2 IMPLANT
SUT VIC AB 1 CT1 8-18 (SUTURE) ×2
SUT VIC AB 2-0 CT2 18 VCP726D (SUTURE) ×3 IMPLANT
SYR BULB IRRIGATION 50ML (SYRINGE) ×2 IMPLANT
SYR CONTROL 10ML LL (SYRINGE) ×2 IMPLANT
TAPE CLOTH 4X10 WHT NS (GAUZE/BANDAGES/DRESSINGS) ×2 IMPLANT
TAPE CLOTH SURG 4X10 WHT LF (GAUZE/BANDAGES/DRESSINGS) ×1 IMPLANT
TOWEL OR 17X24 6PK STRL BLUE (TOWEL DISPOSABLE) ×2 IMPLANT
TOWEL OR 17X26 10 PK STRL BLUE (TOWEL DISPOSABLE) ×2 IMPLANT
TRAY FOLEY CATH 16FRSI W/METER (SET/KITS/TRAYS/PACK) ×2 IMPLANT
WATER STERILE IRR 1000ML POUR (IV SOLUTION) ×1 IMPLANT
YANKAUER SUCT BULB TIP NO VENT (SUCTIONS) ×2 IMPLANT

## 2013-04-10 NOTE — Anesthesia Procedure Notes (Addendum)
Procedure Name: Intubation Date/Time: 04/10/2013 10:41 AM Performed by: Lovie Chol Pre-anesthesia Checklist: Patient identified, Emergency Drugs available, Suction available, Patient being monitored and Timeout performed Patient Re-evaluated:Patient Re-evaluated prior to inductionOxygen Delivery Method: Circle system utilized Preoxygenation: Pre-oxygenation with 100% oxygen Intubation Type: IV induction Ventilation: Mask ventilation without difficulty Laryngoscope Size: Miller and 2 Grade View: Grade I Tube type: Oral Tube size: 7.0 mm Number of attempts: 1 Airway Equipment and Method: Stylet and LTA kit utilized Placement Confirmation: ETT inserted through vocal cords under direct vision,  positive ETCO2,  CO2 detector and breath sounds checked- equal and bilateral Secured at: 21 cm Tube secured with: Tape Dental Injury: Teeth and Oropharynx as per pre-operative assessment

## 2013-04-10 NOTE — Op Note (Signed)
Gina Wolfe, Gina Wolfe NO.:  [id number]  MEDICAL RECORD NO.:  [id number]  LOCATION:  5N01C                        FACILITY:  [facility]  PHYSICIAN:  Estill Bamberg, MD      DATE OF BIRTH:  Aug 01, 1956  DATE OF PROCEDURE:  04/10/2013                              OPERATIVE REPORT   PREOPERATIVE DIAGNOSIS: 1. 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 2. C6-7 degenerative disk disease with bilateral C6-7 foraminal     stenosis. 3. Left-sided C5-6 neural foraminal stenosis.  POSTOPERATIVE DIAGNOSIS: 1. 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 2. C6-7 degenerative disk disease with bilateral C6-7 foraminal     stenosis.  PROCEDURE: 1. Posterior cervical fusion C5-6, C6-7. 2. Posterior cervical fusion C5-6, C6-7. 3. Posterior segmental instrumentation, C5-C7. 4. Use of local autograft. 5. Use of morselized allograft (DBX mix). 6. Bilateral C6-7 laminotomy with partial facetectomy and     foraminotomy. 7. C5-6 laminotomy with partial facetectomy and foraminotomy. 8. Cranial tong application and removal.  SURGEON:  Estill Bamberg, MD  ASSISTANT:  Jason Coop, PA-C  ANESTHESIA:  General endotracheal anesthesia.  COMPLICATIONS:  None.  DISPOSITION:  Stable.  ESTIMATED BLOOD LOSS:  150 mL.  INDICATIONS FOR PROCEDURE:  Briefly, Ms. Broome is a very pleasant 57- year-old female, who did initially presented to me on Feb 02, 2012, with severe neck pain as well as bilateral arm pain.  Of note, the patient is status post a previous noninstrumented fusion in 2001.  The patient did have severe pain in her neck and arms.  I did order and review a CT myelogram, which was notable for an obvious nonunion associated with the C5-6 level.  There was also left-sided foraminal stenosis at C5-6 and bilateral neural foraminal stenosis at C6-7.  I did feel that the stenosis did correlate to her  symptoms.  I, therefore, did have a discussion regarding going forward with a posterior spinal fusion from C5-C7 as well as a decompression bilaterally at C6-7 and on the left at C5-6.  The patient did fully understand the risks and limitations of the procedure as outlined in my preoperative note.  OPERATIVE DETAILS:  On April 10, 2013, the patient was brought to surgery and general endotracheal anesthesia was administered.  At this point, cranial tongs were applied by me.  The patient was then rolled prone. The head was secured appropriately to the [place] head holder attachment.  The neck was gently flexed and translated posteriorly.  The arms were secured to the patient's sides and all bony prominences were meticulously padded.  The neck was then prepped and draped in the usual sterile fashion.  Time-out procedure was performed.  I then made a midline incision from approximately spinous process of C4 to approximately spinous process of C7.  The fascia was sharply incised in the midline using electrocautery.  The posterior elements were identified.  A lateral intraoperative radiograph did confirm the appropriate operative levels.  I then subperiosteally exposed and did decorticate the facet joints at C5-6 and at C6-7.  Of particular note, there was obvious  motion across the C5-6 level, the level that was diagnosed as a nonunion.  At this point, I did use a 1.7-mm burr to prepare the entry point for the C5 lateral mass screws.  A 14 mm drill was used to prepare the lateral masses on the right and on the left at the C5 level.  I did angle the drill superiorly and laterally.  A 3 mm tap was then used.  I then performed a laminoforaminotomy bilaterally at C6-7.  In doing so, I was able to decompress the neural foramen on the right and on the left.  I was also able to palpate the C7 pedicles.  I then used a 1.7-mm bur to prepare the entry point of the pedicle hole. A gearshift probe was  then used.  A 3-mm tap was then used.  I did use a ball-tip probe to confirm that there was no cortical violation.  Bone wax was placed in the place of the cannulated pedicle holes.  At this point, I turned my attention towards the left C5-6 interspace.  A partial facetectomy and foraminotomy was again performed.  It was obvious that there was rather significant compression of the foramen on the left side.  I did feel that this did likely correlate to the patient's left arm pain.  After thorough a foraminotomy was performed, I was easily able to pass a nerve hook to the lateral aspect of the C6 pedicle.  This did confirm adequate decompression of the exiting C6 nerve on the left side.  The same was true of the exiting C7 nerves on both the right and the left sides.  At this point, I did use a 3 mm high- speed bur to decorticate the posterior elements of C5, C6, and C7.  I did also used a 1.7-mm bur to decorticate the facet joints of C5-6 and C6-7 on both the right and the left sides.  I did copiously irrigate the wound intermittently throughout the procedure.  At this point, a 3.5 x 14 mm screw was placed on the left at C5 and a 3.5 x 12 mm screw was placed on the right at C5.  A 3.5 x 22 mm screws were placed bilaterally at C7.  I was very pleased with the purchase of each of the screws.  I then fashioned a 3.5 mm rod into the appropriate degree of lordosis. The rod was secured into the screw heads on both the right and the left sides.  Caps were placed and final locking procedure was performed.  I then packed the DBX mix in addition to autograft obtained from the decompressive aspect of the procedure, across the posterior elements and into the facet joints on the right and the left sides.  A lateral intraoperative radiograph did confirm the appropriate operative levels. I very pleased with the appearance on the radiograph.  The wound was then explored for any undue bleeding and there was  none noted.  I then closed the fascia using #1 Vicryl.  The subcutaneous layer was closed using 2-0 Vicryl.  The skin was closed using 3-0 Monocryl.  Benzoin and Steri-Strips were applied followed by sterile dressing.  All instrument counts were correct at the termination of the procedure.  The patient was then rolled supine and the [place] head holder was removed.  A hard collar was then placed.  All instrument counts were correct at the termination of the procedure.  Of note, Jason Coop was my assistant throughout the entirety of  the procedure and aided in essential retraction and suctioning required throughout the surgery.     Estill Bamberg, MD     MD/MEDQ  D:  04/10/2013  T:  04/10/2013  Job:  [id number]  cc:   Madelin Rear. Sherwood Gambler, MD

## 2013-04-10 NOTE — H&P (Signed)
PREOPERATIVE H&P  Chief Complaint: left arm pain, neck pain  HPI: Gina Wolfe is a 57 y.o. female who presents with ongoing left arm and neck pain pain s/p attempted 5/6 fusion by another physician. Patient subsequently had severe neck pain and left arm pain. CT revealed nonunion and severe NF compression at 56 and 67 on left. Failed conservative care.   Past Medical History  Diagnosis Date  . Complication of anesthesia   . PONV (postoperative nausea and vomiting)   . Spinal headache   . Anxiety   . IBS (irritable bowel syndrome)     Hx: of  . Diverticulitis     Hx: of  . Heart murmur     Hx: of as a baby  . Asthma     Hx: of  . GERD (gastroesophageal reflux disease)   . Numbness and tingling     Hx; of left arm  . Arthritis    Past Surgical History  Procedure Laterality Date  . Cyst excision      Hx: of right eye cyst  . Back surgery    . Cholecystectomy    . Appendectomy    . Colonoscopy      Hx: of  . Tubal ligation    . Abdominal hysterectomy     History   Social History  . Marital Status: Married    Spouse Name: N/A    Number of Children: N/A  . Years of Education: N/A   Social History Main Topics  . Smoking status: Former Games developer  . Smokeless tobacco: Never Used  . Alcohol Use: No  . Drug Use: No  . Sexually Active: Not on file   Other Topics Concern  . Not on file   Social History Narrative  . No narrative on file   Family History  Problem Relation Age of Onset  . Hypertension Mother   . Emphysema Mother   . Cancer Father   . Hypertension Other   . Diabetes Other    Allergies  Allergen Reactions  . Clindamycin Hcl Anaphylaxis  . Codeine Nausea Only   Prior to Admission medications   Medication Sig Start Date End Date Taking? Authorizing Provider  ALPRAZolam Prudy Feeler) 1 MG tablet Take 1 mg by mouth daily as needed for anxiety.   Yes Historical Provider, MD  estrogens, conjugated, (PREMARIN) 0.625 MG tablet Take 0.625 mg by mouth daily  with breakfast. Take daily for 21 days then do not take for 7 days.   Yes Historical Provider, MD  ibuprofen (ADVIL,MOTRIN) 200 MG tablet Take 200-400 mg by mouth every 6 (six) hours as needed for pain.   Yes Historical Provider, MD     All other systems have been reviewed and were otherwise negative with the exception of those mentioned in the HPI and as above.  Physical Exam: There were no vitals filed for this visit.  General: Alert, no acute distress Cardiovascular: No pedal edema Respiratory: No cyanosis, no use of accessory musculature Skin: No lesions in the area of chief complaint Neurologic: Sensation intact distally Psychiatric: Patient is competent for consent with normal mood and affect Lymphatic: No axillary or cervical lymphadenopathy  MUSCULOSKELETAL: + spurling on left  Assessment/Plan: Left arm pain and neck pain Plan for Procedure(s): POSTERIOR CERVICAL FUSION/FORAMINOTOMY LEVEL 2 (5-7)   Emilee Hero, MD 04/10/2013 7:25 AM

## 2013-04-10 NOTE — Transfer of Care (Signed)
Immediate Anesthesia Transfer of Care Note  Patient: Gina Wolfe  Procedure(s) Performed: Procedure(s) with comments: POSTERIOR CERVICAL FUSION/FORAMINOTOMY LEVEL 2 (N/A) - Posterior cervical decompression fusion, cervical 5-6, cervical 6-7 with instrumentation and allograft.  Patient Location: PACU  Anesthesia Type:General  Level of Consciousness: awake, alert , oriented and patient cooperative  Airway & Oxygen Therapy: Patient Spontanous Breathing and Patient connected to nasal cannula oxygen  Post-op Assessment: Report given to PACU RN and Post -op Vital signs reviewed and stable  Post vital signs: Reviewed and stable  Complications: No apparent anesthesia complications

## 2013-04-10 NOTE — Preoperative (Signed)
Beta Blockers   Reason not to administer Beta Blockers:Not Applicable 

## 2013-04-10 NOTE — Anesthesia Preprocedure Evaluation (Addendum)
Anesthesia Evaluation  Patient identified by MRN, date of birth, ID band Patient awake    Reviewed: Allergy & Precautions, H&P , NPO status , Patient's Chart, lab work & pertinent test results  History of Anesthesia Complications (+) PONV  Airway Mallampati: I TM Distance: >3 FB     Dental  (+) Partial Upper and Dental Advisory Given   Pulmonary asthma , former smoker,  breath sounds clear to auscultation        Cardiovascular negative cardio ROS  Rhythm:Regular Rate:Normal     Neuro/Psych  Headaches, Anxiety    GI/Hepatic Neg liver ROS, GERD-  Controlled,  Endo/Other  negative endocrine ROS  Renal/GU negative Renal ROS     Musculoskeletal   Abdominal   Peds  Hematology negative hematology ROS (+)   Anesthesia Other Findings   Reproductive/Obstetrics negative OB ROS                          Anesthesia Physical Anesthesia Plan  ASA: II  Anesthesia Plan: General   Post-op Pain Management:    Induction: Intravenous  Airway Management Planned: Oral ETT  Additional Equipment:   Intra-op Plan:   Post-operative Plan: Extubation in OR  Informed Consent: I have reviewed the patients History and Physical, chart, labs and discussed the procedure including the risks, benefits and alternatives for the proposed anesthesia with the patient or authorized representative who has indicated his/her understanding and acceptance.     Plan Discussed with: CRNA and Surgeon  Anesthesia Plan Comments:         Anesthesia Quick Evaluation

## 2013-04-10 NOTE — Anesthesia Postprocedure Evaluation (Signed)
  Anesthesia Post-op Note  Patient: Gina Wolfe  Procedure(s) Performed: Procedure(s) with comments: POSTERIOR CERVICAL FUSION/FORAMINOTOMY LEVEL 2 (N/A) - Posterior cervical decompression fusion, cervical 5-6, cervical 6-7 with instrumentation and allograft.  Patient Location: PACU  Anesthesia Type:General  Level of Consciousness: awake and alert   Airway and Oxygen Therapy: Patient Spontanous Breathing  Post-op Pain: mild  Post-op Assessment: Post-op Vital signs reviewed, Patient's Cardiovascular Status Stable, Respiratory Function Stable, Patent Airway, No signs of Nausea or vomiting and Pain level controlled  Post-op Vital Signs: stable  Complications: No apparent anesthesia complications

## 2013-04-11 ENCOUNTER — Encounter (HOSPITAL_COMMUNITY): Payer: Self-pay | Admitting: Orthopedic Surgery

## 2013-04-11 MED ORDER — PROMETHAZINE HCL 12.5 MG PO TABS: 12.5000 mg | ORAL_TABLET | Freq: Four times a day (QID) | ORAL | Status: DC | PRN

## 2013-04-11 NOTE — Progress Notes (Signed)
Occupational Therapy Evaluation Patient Details Name: Gina Wolfe MRN: [id number] DOB: Sep 13, 1955 Today's Date: 04/11/2013 Time: 1027-2536 OT Time Calculation (min): 59 min  OT Assessment / Plan / Recommendation History of present illness   S/P posterior spinal fusion C5-7 for nonunion and L arm pain.She has numbness of her fingertips and some residual tingling but states the pain is gone. She is very happy and c/o posterior neck and upper back pain mostly.       Clinical Impression   PTA, pt independent with all ADL and mobilty. Completed all education regarding cervical precautions and compensatory techniques for ADL. Educated on donning/doffing surgical brace and switching out to [place] collar. Pt/husband demonstrated understanding. Written information given. Pt educated on grip/pinch strengthening exercises for L hand. Given exercise ball and theraputty. Pt to follow up with MD and further therapy needs will be identified at that time.     OT Assessment  Patient does not need any further OT services    Follow Up Recommendations  No OT follow up    Barriers to Discharge      Equipment Recommendations  Tub/shower seat;Other (comment) (pt to get on own)    Recommendations for Other Services    Frequency       Precautions / Restrictions Precautions Precautions: Cervical Restrictions Weight Bearing Restrictions: No Other Position/Activity Restrictions: no pushing pulling lifting. cervical precautions   Pertinent Vitals/Pain 5 neck/surgical/premedicated    ADL  Eating/Feeding: Modified independent Where Assessed - Eating/Feeding: Chair Grooming: Supervision/safety;Set up Where Assessed - Grooming: Unsupported standing Upper Body Bathing: Supervision/safety;Set up Where Assessed - Upper Body Bathing: Unsupported sitting Lower Body Bathing: Supervision/safety;Set up Where Assessed - Lower Body Bathing: Unsupported sit to stand Upper Body Dressing: Set  up;Supervision/safety Where Assessed - Upper Body Dressing: Unsupported sitting Lower Body Dressing: Supervision/safety;Set up Where Assessed - Lower Body Dressing: Supported sit to stand Toilet Transfer: Hydrographic surveyor Method: Other (comment) (ambulating) Acupuncturist: Comfort height toilet Toileting - Clothing Manipulation and Hygiene: Modified independent Where Assessed - Toileting Clothing Manipulation and Hygiene: Sit to stand from 3-in-1 or toilet Equipment Used: Other (comment) (cervical brace) Transfers/Ambulation Related to ADLs: Minguard ADL Comments: Educated pt/husband on compensatory techniques following cervical fusion and cervical precautions. written information given. Educated pt/husband on donning.doffing cervical brace and use of shower collar.    OT Diagnosis:    OT Problem List:   OT Treatment Interventions:     OT Goals(Current goals can be found in the care plan section) Acute Rehab OT Goals Patient Stated Goal: go home OT Goal Formulation:  (eval only)  Visit Information  Last OT Received On: 04/11/13 Assistance Needed: +1       Prior Functioning     Home Living Family/patient expects to be discharged to:: Private residence Living Arrangements: Spouse/significant other Available Help at Discharge: Family;Available 24 hours/day Type of Home: House Home Access: Stairs to enter [employer] of Steps: 2 Home Layout: One level Home Equipment: None Prior Function Level of Independence: Independent Dominant Hand: Right         Vision/Perception     Cognition  Cognition Arousal/Alertness: Awake/alert Behavior During Therapy: WFL for tasks assessed/performed Overall Cognitive Status: Within Functional Limits for tasks assessed    Extremity/Trunk Assessment Upper Extremity Assessment Upper Extremity Assessment: LUE deficits/detail LUE Deficits / Details: decreaed strength. parasthesias LUE Sensation: decreased  light touch LUE Coordination: decreased fine motor;decreased gross motor Lower Extremity Assessment Lower Extremity Assessment: Overall WFL for tasks assessed Cervical /  Trunk Assessment Cervical / Trunk Assessment: Other exceptions (cevical fusion)     Mobility Bed Mobility Bed Mobility: Rolling Right;Right Sidelying to Sit;Supine to Sit Rolling Right: 5: Supervision Right Sidelying to Sit: 4: Min guard Supine to Sit: 5: Supervision Details for Bed Mobility Assistance: educated on log rolling technique Transfers Transfers: Sit to Stand;Stand to Sit Sit to Stand: 4: Min guard Stand to Sit: 4: Min guard Details for Transfer Assistance: minguard due to dizziness - apparent related to meds     Exercise  educated on theraputty for pinch/grip strengthening and exercise ball.   Balance Balance Balance Assessed: Yes (minguard at this time due to meds)   End of Session OT - End of Session Equipment Utilized During Treatment: Cervical collar Activity Tolerance: Patient tolerated treatment well Patient left: in chair;with call bell/phone within reach;with family/visitor present Nurse Communication: Mobility status;Other (comment);Weight bearing status;Precautions (ready for d/C)  GO     Burak Zerbe,HILLARY 04/11/2013, 11:28 AM Luisa Dago, OTR/L  [phone] 04/11/2013

## 2013-04-11 NOTE — Progress Notes (Signed)
PT Cancellation Note  Patient Details Name: Gina Wolfe MRN: [id number] DOB: 08-Jun-1956   Cancelled Treatment:    Reason Eval/Treat Not Completed: PT screened, no needs identified, will sign off . Spoke with OT; OT reports pt does not have PT needs; is safe from mobility standpoint. Will sign off on pt at this time.    Donnamarie Poag [place], [place] [phone] 04/11/2013, 10:22 AM

## 2013-04-11 NOTE — Progress Notes (Signed)
UR COMPLETED  

## 2013-04-11 NOTE — Progress Notes (Signed)
PO Day #1 S/P posterior spinal fusion C5-7 for nonunion and L arm pain. The patient is doing excellent this morning, she is feeling much better today and her nausea is controlled on the phenergan. She is enjoying her breakfast and states that the pain in her L arm is much improved. She has numbness of her fingertips and some residual tingling but states the pain is gone. She is very happy and c/o posterior neck and upper back pain mostly.   BP 109/63  Pulse 66  Temp(Src) 97.7 F (36.5 C) (Oral)  Resp 13  SpO2 99% Pt is sitting up in bed eating breakfast, hard collar is slightly loose, upon removal her posterior bandage is CDI, she is neurovascularly intact. SCDs in place, negative Homans.   PO Day #1 S/P posterior spinal fusion C5-7 for nonunion and L radiculopathy ongoing since 2001  -L arm pain resolved, N/T present, pt understand can be residual and pleased with results  - Pain well controlled on percocet/valium with phenergan for nausea   -Written scripts in chart for D/C  -[place] collar at bedside to take home  -Hard collar adjusted and appropriate tightness discusssed  -D/C home today following OT  -F/U in office in 2 weeks

## 2013-04-14 MED FILL — Heparin Sodium (Porcine) Inj 1000 Unit/ML: INTRAMUSCULAR | Qty: 30 | Status: AC

## 2013-04-14 MED FILL — Sodium Chloride IV Soln 0.9%: INTRAVENOUS | Qty: 1000 | Status: AC

## 2013-04-16 NOTE — Discharge Summary (Signed)
Patient ID: Gina Wolfe MRN: [id number] DOB/AGE: [birth year] 57 y.o.  Admit date: 04/10/2013 Discharge date: 04/11/2013  Admission Diagnoses: Nonunion and radiculopathy  Discharge Diagnoses:  Same  Past Medical History  Diagnosis Date  . Complication of anesthesia   . PONV (postoperative nausea and vomiting)   . Spinal headache   . Anxiety   . IBS (irritable bowel syndrome)     Hx: of  . Diverticulitis     Hx: of  . Heart murmur     Hx: of as a baby  . Asthma     Hx: of  . GERD (gastroesophageal reflux disease)   . Numbness and tingling     Hx; of left arm  . Arthritis     Surgeries: Procedure(s): POSTERIOR CERVICAL FUSION/FORAMINOTOMY LEVEL 2 C5-7 on 04/10/2013   Consultants: None  Discharged Condition: Improved  Hospital Course: Gina Wolfe is an 57 y.o. female who was admitted 04/10/2013 for operative treatment of nonunion and radiculopathy. Patient has severe unremitting pain that affects sleep, daily activities, and work/hobbies. After pre-op clearance the patient was taken to the operating room on 04/10/2013 and underwent  Procedure(s): POSTERIOR CERVICAL FUSION/FORAMINOTOMY LEVEL 2 C5-7.    Patient was given perioperative antibiotics:  Anti-infectives   Start     Dose/Rate Route Frequency Ordered Stop   04/10/13 1900  ceFAZolin (ANCEF) IVPB 1 g/50 mL premix     1 g 100 mL/hr over 30 Minutes Intravenous Every 8 hours 04/10/13 1718 04/11/13 0443   04/10/13 0600  ceFAZolin (ANCEF) IVPB 2 g/50 mL premix     2 g 100 mL/hr over 30 Minutes Intravenous On call to O.R. 04/09/13 1442 04/10/13 1040       Patient was given sequential compression devices, early ambulation to prevent DVT.  Patient benefited maximally from hospital stay and there were no complications.    Recent vital signs: BP 109/63  Pulse 66  Temp(Src) 97.7 F (36.5 C) (Oral)  Resp 13  SpO2 99%  Discharge Medications:     Medication List    STOP taking these medications       ibuprofen 200  MG tablet  Commonly known as:  ADVIL,MOTRIN      TAKE these medications       ALPRAZolam 1 MG tablet  Commonly known as:  XANAX  Take 1 mg by mouth daily as needed for anxiety.     estrogens (conjugated) 0.625 MG tablet  Commonly known as:  PREMARIN  Take 0.625 mg by mouth daily with breakfast. Take daily for 21 days then do not take for 7 days.     promethazine 12.5 MG tablet  Commonly known as:  PHENERGAN  Take 1 tablet (12.5 mg total) by mouth every 6 (six) hours as needed for nausea.        Diagnostic Studies: Dg Chest 2 View  04/04/2013   *RADIOLOGY REPORT*  Clinical Data: Preop for cervical spine surgery.  CHEST - 2 VIEW  Comparison: 04/01/2009  Findings: Cholecystectomy clips. Midline trachea.  Normal heart size and mediastinal contours. No pleural effusion or pneumothorax. Clear lungs.  IMPRESSION: No acute cardiopulmonary disease.   Original Report Authenticated By: Jeronimo Greaves, M.D.   Dg Cervical Spine 2 Or 3 Views  04/10/2013   *RADIOLOGY REPORT*  Clinical Data: Postop C spine.  CERVICAL SPINE - 2-3 VIEW  Comparison: Intraoperative imaging performed earlier today.  Findings: Changes of posterior fusion noted.  Pedicle screws noted at two levels, which appear to be at  C5 and C7.  The C7 vertebral body is not visualized on the lateral view due to overlying shoulders.  No visible complicating feature.  IMPRESSION: Posterior fusion changes as above.   Original Report Authenticated By: Charlett Nose, M.D.   Dg Cervical Spine 2-3 Views  04/10/2013   *RADIOLOGY REPORT*  Clinical Data: C5-C7 posterior fusion.  CERVICAL SPINE - 2-3 VIEW  Comparison: 02/14/2012  Findings: First lateral intraoperative image demonstrates a posterior surgical instrument directed over the C6 spinous process.  Second intraoperative image demonstrates posterior fusion changes. Pedicle screws appear to be present at C5 and C7.  The lower cervical spine is not well visualized due to overlying shoulders.   IMPRESSION: Intraoperative imaging as above.   Original Report Authenticated By: Charlett Nose, M.D.    Disposition: 01-Home or Self Care  PO Day #1 S/P posterior spinal fusion C5-7 for nonunion and L arm pain. The patient is doing excellent this morning, she is feeling much better today and her nausea is controlled on the phenergan. She is enjoying her breakfast and states that the pain in her L arm is much improved. She has numbness of her fingertips and some residual tingling but states the pain is gone. She is very happy and c/o posterior neck and upper back pain mostly.   PO Day #1 S/P posterior spinal fusion C5-7 for nonunion and L radiculopathy ongoing since 2001  -L arm pain resolved, N/T present, pt understand can be residual and pleased with results  - Pain well controlled on percocet/valium with phenergan for nausea  -Written scripts in chart for D/C  -[place] collar at bedside to take home  -Hard collar adjusted and appropriate tightness discusssed  -D/C home today following OT  -F/U in office in 2 weeks   Signed: Georga Bora 04/16/2013, 10:56 AM

## 2013-06-26 ENCOUNTER — Other Ambulatory Visit (HOSPITAL_COMMUNITY): Payer: Self-pay | Admitting: Internal Medicine

## 2013-06-26 DIAGNOSIS — Z139 Encounter for screening, unspecified: Secondary | ICD-10-CM

## 2013-07-07 ENCOUNTER — Ambulatory Visit (HOSPITAL_COMMUNITY)
Admission: RE | Admit: 2013-07-07 | Discharge: 2013-07-07 | Disposition: A | Discharge status: Home / Self Care | Payer: BC Managed Care – PPO | Source: Ambulatory Visit | Attending: Internal Medicine | Admitting: Internal Medicine

## 2013-07-07 DIAGNOSIS — Z1231 Encounter for screening mammogram for malignant neoplasm of breast: Secondary | ICD-10-CM | POA: Insufficient documentation

## 2013-07-07 DIAGNOSIS — Z139 Encounter for screening, unspecified: Secondary | ICD-10-CM

## 2013-08-26 ENCOUNTER — Other Ambulatory Visit (HOSPITAL_COMMUNITY): Payer: Self-pay | Admitting: Internal Medicine

## 2013-08-26 DIAGNOSIS — R51 Headache: Secondary | ICD-10-CM

## 2013-08-27 ENCOUNTER — Ambulatory Visit (HOSPITAL_COMMUNITY)
Admission: RE | Admit: 2013-08-27 | Discharge: 2013-08-27 | Disposition: A | Discharge status: Home / Self Care | Payer: BC Managed Care – PPO | Source: Ambulatory Visit | Attending: Internal Medicine | Admitting: Internal Medicine

## 2013-08-27 DIAGNOSIS — R51 Headache: Secondary | ICD-10-CM | POA: Insufficient documentation

## 2013-08-28 ENCOUNTER — Ambulatory Visit (HOSPITAL_COMMUNITY): Payer: BC Managed Care – PPO

## 2014-02-10 ENCOUNTER — Other Ambulatory Visit (HOSPITAL_COMMUNITY): Payer: Self-pay | Admitting: Internal Medicine

## 2014-02-10 DIAGNOSIS — M542 Cervicalgia: Secondary | ICD-10-CM

## 2014-02-12 ENCOUNTER — Ambulatory Visit (HOSPITAL_COMMUNITY)
Admission: RE | Admit: 2014-02-12 | Discharge: 2014-02-12 | Disposition: A | Discharge status: Home / Self Care | Payer: BC Managed Care – PPO | Source: Ambulatory Visit | Attending: Internal Medicine | Admitting: Internal Medicine

## 2014-02-12 DIAGNOSIS — M542 Cervicalgia: Secondary | ICD-10-CM | POA: Insufficient documentation

## 2014-02-12 DIAGNOSIS — M5380 Other specified dorsopathies, site unspecified: Secondary | ICD-10-CM | POA: Insufficient documentation

## 2014-02-12 DIAGNOSIS — M4802 Spinal stenosis, cervical region: Secondary | ICD-10-CM | POA: Insufficient documentation

## 2014-02-12 DIAGNOSIS — Z981 Arthrodesis status: Secondary | ICD-10-CM | POA: Insufficient documentation

## 2014-02-12 DIAGNOSIS — R209 Unspecified disturbances of skin sensation: Secondary | ICD-10-CM | POA: Insufficient documentation

## 2014-02-12 DIAGNOSIS — M5030 Other cervical disc degeneration, unspecified cervical region: Secondary | ICD-10-CM | POA: Insufficient documentation

## 2014-04-21 ENCOUNTER — Other Ambulatory Visit: Payer: Self-pay | Admitting: Neurosurgery

## 2014-04-21 DIAGNOSIS — M5412 Radiculopathy, cervical region: Secondary | ICD-10-CM

## 2014-06-04 ENCOUNTER — Other Ambulatory Visit (HOSPITAL_COMMUNITY): Payer: Self-pay | Admitting: Internal Medicine

## 2014-06-04 DIAGNOSIS — R109 Unspecified abdominal pain: Secondary | ICD-10-CM

## 2014-06-04 DIAGNOSIS — R1032 Left lower quadrant pain: Secondary | ICD-10-CM

## 2014-06-08 ENCOUNTER — Ambulatory Visit (HOSPITAL_COMMUNITY)
Admission: RE | Admit: 2014-06-08 | Discharge: 2014-06-08 | Disposition: A | Discharge status: Home / Self Care | Payer: BC Managed Care – PPO | Source: Ambulatory Visit | Attending: Internal Medicine | Admitting: Internal Medicine

## 2014-06-08 DIAGNOSIS — R110 Nausea: Secondary | ICD-10-CM | POA: Insufficient documentation

## 2014-06-08 DIAGNOSIS — K5730 Diverticulosis of large intestine without perforation or abscess without bleeding: Secondary | ICD-10-CM | POA: Diagnosis not present

## 2014-06-08 DIAGNOSIS — R1032 Left lower quadrant pain: Secondary | ICD-10-CM

## 2014-06-08 DIAGNOSIS — R109 Unspecified abdominal pain: Secondary | ICD-10-CM | POA: Diagnosis not present

## 2014-06-08 MED ORDER — IOHEXOL 300 MG/ML  SOLN
100.0000 mL | Freq: Once | INTRAMUSCULAR | Status: AC | PRN
  Administered 2014-06-08: 100 mL via INTRAVENOUS

## 2014-06-11 ENCOUNTER — Encounter: Payer: Self-pay | Admitting: Internal Medicine

## 2014-07-15 ENCOUNTER — Ambulatory Visit: Payer: BC Managed Care – PPO | Admitting: Gastroenterology

## 2014-07-15 ENCOUNTER — Encounter: Payer: Self-pay | Admitting: Gastroenterology

## 2014-07-20 ENCOUNTER — Other Ambulatory Visit: Payer: Self-pay | Admitting: Neurosurgery

## 2014-07-20 DIAGNOSIS — M5412 Radiculopathy, cervical region: Secondary | ICD-10-CM

## 2014-07-31 ENCOUNTER — Other Ambulatory Visit: Payer: BC Managed Care – PPO

## 2014-08-20 ENCOUNTER — Ambulatory Visit
Admission: RE | Admit: 2014-08-20 | Discharge: 2014-08-20 | Disposition: A | Discharge status: Home / Self Care | Payer: BC Managed Care – PPO | Source: Ambulatory Visit | Attending: Neurosurgery | Admitting: Neurosurgery

## 2014-08-20 DIAGNOSIS — M5412 Radiculopathy, cervical region: Secondary | ICD-10-CM

## 2014-08-20 MED ORDER — DIAZEPAM 5 MG PO TABS
10.0000 mg | ORAL_TABLET | Freq: Once | ORAL | Status: AC
  Administered 2014-08-20: 10 mg via ORAL

## 2014-08-20 MED ORDER — HYDROMORPHONE HCL 1 MG/ML IJ SOLN
1.0000 mg | Freq: Once | INTRAMUSCULAR | Status: AC
  Administered 2014-08-20: 1 mg via INTRAMUSCULAR

## 2014-08-20 MED ORDER — ONDANSETRON HCL 4 MG/2ML IJ SOLN
4.0000 mg | Freq: Once | INTRAMUSCULAR | Status: AC
  Administered 2014-08-20: 4 mg via INTRAMUSCULAR

## 2014-08-20 MED ORDER — IOHEXOL 300 MG/ML  SOLN
10.0000 mL | Freq: Once | INTRAMUSCULAR | Status: AC | PRN
  Administered 2014-08-20: 10 mL via INTRATHECAL

## 2014-08-20 MED ORDER — MEPERIDINE HCL 100 MG/ML IJ SOLN
50.0000 mg | Freq: Once | INTRAMUSCULAR | Status: AC
  Administered 2014-08-20: 50 mg via INTRAMUSCULAR

## 2014-08-20 MED ORDER — ONDANSETRON 8 MG PO TBDP
8.0000 mg | ORAL_TABLET | Freq: Once | ORAL | Status: AC
  Administered 2014-08-20: 8 mg via ORAL

## 2014-08-20 NOTE — Progress Notes (Signed)
Patient states she has been off Phenergan for at least the past two days. 

## 2014-08-20 NOTE — Progress Notes (Signed)
Pt given another dose of zofran by mouth after a bout of nausea and vomiting.

## 2014-08-20 NOTE — Discharge Instructions (Signed)
Myelogram Discharge Instructions  1. Go home and rest quietly for the next 24 hours.  It is important to lie flat for the next 24 hours.  Get up only to go to the restroom.  You may lie in the bed or on a couch on your back, your stomach, your left side or your right side.  You may have one pillow under your head.  You may have pillows between your knees while you are on your side or under your knees while you are on your back.  2. DO NOT drive today.  Recline the seat as far back as it will go, while still wearing your seat belt, on the way home.  3. You may get up to go to the bathroom as needed.  You may sit up for 10 minutes to eat.  You may resume your normal diet and medications unless otherwise indicated.  Drink lots of extra fluids today and tomorrow.  4. The incidence of headache, nausea, or vomiting is about 5% (one in 20 patients).  If you develop a headache, lie flat and drink plenty of fluids until the headache goes away.  Caffeinated beverages may be helpful.  If you develop severe nausea and vomiting or a headache that does not go away with flat bed rest, call [phone].  5. You may resume normal activities after your 24 hours of bed rest is over; however, do not exert yourself strongly or do any heavy lifting tomorrow. If when you get up you have a headache when standing, go back to bed and force fluids for another 24 hours.  6. Call your physician for a follow-up appointment.  The results of your myelogram will be sent directly to your physician by the following day.  7. If you have any questions or if complications develop after you arrive home, please call [phone].  Discharge instructions have been explained to the patient.  The patient, or the person responsible for the patient, fully understands these instructions.      May resume Phenergan on Dec. 11, 2015, after 9:30 am.

## 2014-08-24 ENCOUNTER — Encounter: Payer: Self-pay | Admitting: Cardiology

## 2014-08-24 ENCOUNTER — Ambulatory Visit (INDEPENDENT_AMBULATORY_CARE_PROVIDER_SITE_OTHER): Payer: BC Managed Care – PPO | Admitting: Cardiology

## 2014-08-24 VITALS — BP 130/78 | HR 60 | Ht 62.0 in | Wt 150.0 lb

## 2014-08-24 DIAGNOSIS — R072 Precordial pain: Secondary | ICD-10-CM

## 2014-08-24 DIAGNOSIS — R0602 Shortness of breath: Secondary | ICD-10-CM

## 2014-08-24 DIAGNOSIS — K219 Gastro-esophageal reflux disease without esophagitis: Secondary | ICD-10-CM

## 2014-08-24 NOTE — Assessment & Plan Note (Signed)
Symptoms as outlined above, etiology not entirely clear. Patient reports no regular exercise regimen, states that she is limited by chronic pain and left-sided numbness with history of previous back surgeries. ECG is nonspecific at rest. Cardiac risk factors include previous history of tobacco abuse. Lipids were reasonable based on testing earlier in the year, actually with high HDL and LDL under 100. She reports childhood heart murmur, no significant murmur on examination now. Plan is to obtain an echocardiogram for formal cardiac structural evaluation, also an exercise Cardiolite (can switch to [place] if needed). We will plan to call her with the results.

## 2014-08-24 NOTE — Patient Instructions (Signed)
Your physician recommends that you schedule a follow-up appointment in: to be determined after testing.We will call you with the results    Your physician has requested that you have an echocardiogram. Echocardiography is a painless test that uses sound waves to create images of your heart. It provides your doctor with information about the size and shape of your heart and how well your heart's chambers and valves are working. This procedure takes approximately one hour. There are no restrictions for this procedure.     Your physician has requested that you have en exercise stress myoview. For further information please visit [email or web address]. Please follow instruction sheet, as given.      Thank you for choosing [facility] !

## 2014-08-24 NOTE — Progress Notes (Signed)
Reason for visit: Shortness of breath  Clinical Summary Gina Wolfe is a 58 y.o.female referred for cardiology consultation by Dr. Gerarda Fraction. She is here with her husband. She reports a fairly long-standing, at least 6 month history of exertional shortness of breath, particularly when she goes up an incline or walks up steps. She denies any feelings of chest discomfort however. Also feels like sometimes she has to yawn and take a deep breath when she is sitting still. Reports NYHA class 2-3 symptoms. She has had no orthopnea or PND, no leg edema. She reports a history of heart murmur as a child, although no known history of structural heart disease. She has not undergone any prior ischemic or structural cardiac testing however. ECG today shows sinus rhythm with low voltage, normal intervals.  She states that she is undergoing disability evaluations related to chronic pain, states she has had several back surgeries and has numbness on the left side of her body. I do not have the details.  Lab work from July showed hemoglobin 13.6, platelets 341, potassium 4.8, BUN 11, creatinine 0.6, normal AST and ALT, cholesterol 171, triglycerides 114, HDL 88, LDL 90, TSH 1.1. ECG from August 2014 showed sinus arrhythmia.   Allergies  Allergen Reactions  . Clindamycin Hcl Anaphylaxis  . Codeine Nausea Only    Current Outpatient Prescriptions  Medication Sig Dispense Refill  . ALPRAZolam (XANAX) 1 MG tablet Take 1 mg by mouth daily as needed for anxiety.    Marland Kitchen estrogens, conjugated, (PREMARIN) 0.625 MG tablet Take 0.625 mg by mouth daily with breakfast. Take daily for 21 days then do not take for 7 days.    . hyoscyamine (LEVBID) 0.375 MG 12 hr tablet Take 0.375 mg by mouth 2 (two) times daily.   1  . LINZESS 290 MCG CAPS capsule Take 290 mcg by mouth daily.  2  . oxyCODONE-acetaminophen (PERCOCET) 10-325 MG per tablet Take 1 tablet by mouth every 4 (four) hours as needed.   0  . promethazine (PHENERGAN) 12.5  MG tablet Take 1 tablet (12.5 mg total) by mouth every 6 (six) hours as needed for nausea. 40 tablet 0   No current facility-administered medications for this visit.    Past Medical History  Diagnosis Date  . Spinal headache   . Anxiety   . IBS (irritable bowel syndrome)   . Diverticulitis   . Heart murmur     As child  . Asthma   . GERD (gastroesophageal reflux disease)   . Arthritis   . Chronic pain syndrome     Past Surgical History  Procedure Laterality Date  . Cyst excision Right     Eye cyst  . Back surgery    . Cholecystectomy    . Appendectomy    . Colonoscopy  05/20/10    OMV:EHMCNOBS hemorrhoids otherwise normal/EGD:distal esophageal erosions otherwise normal  . Tubal ligation    . Abdominal hysterectomy    . Posterior cervical fusion/foraminotomy N/A 04/10/2013    Procedure: POSTERIOR CERVICAL FUSION/FORAMINOTOMY LEVEL 2;  Surgeon: Sinclair Ship, MD;  Location: [facility];  Service: Orthopedics;  Laterality: N/A;  Posterior cervical decompression fusion, cervical 5-6, cervical 6-7 with instrumentation and allograft.    Family History  Problem Relation Age of Onset  . Hypertension Mother   . Emphysema Mother   . Cancer Father   . Hypertension Other   . Diabetes Other     Social History Gina Wolfe reports that she quit smoking about 7 years  ago. Her smoking use included Cigarettes. She smoked 0.00 packs per day. She has never used smokeless tobacco. Gina Wolfe reports that she does not drink alcohol.  Review of Systems Complete review of systems negative except as otherwise outlined in the clinical summary and also the following. Occasional feeling of palpitations, no dizziness or syncope. States the Xanax helps the symptoms.  Physical Examination Filed Vitals:   08/24/14 1021  BP: 130/78  Pulse: 60   Filed Weights   08/24/14 1021  Weight: 150 lb (68.04 kg)   Patient appears comfortable at rest. HEENT: Conjunctiva and lids normal, oropharynx  clear. Neck: Supple, no elevated JVP or carotid bruits, no thyromegaly. Lungs: Clear to auscultation, nonlabored breathing at rest. Cardiac: Regular rate and rhythm, no S3 or significant systolic murmur, no pericardial rub. Abdomen: Soft, nontender, bowel sounds present, no guarding or rebound. Extremities: No pitting edema, distal pulses 2+. Skin: Warm and dry. Musculoskeletal: No kyphosis. Neuropsychiatric: Alert and oriented x3, affect grossly appropriate.   Problem List and Plan   Shortness of breath Symptoms as outlined above, etiology not entirely clear. Patient reports no regular exercise regimen, states that she is limited by chronic pain and left-sided numbness with history of previous back surgeries. ECG is nonspecific at rest. Cardiac risk factors include previous history of tobacco abuse. Lipids were reasonable based on testing earlier in the year, actually with high HDL and LDL under 100. She reports childhood heart murmur, no significant murmur on examination now. Plan is to obtain an echocardiogram for formal cardiac structural evaluation, also an exercise Cardiolite (can switch to [place] if needed). We will plan to call her with the results.  GERD Managed by Dr. Gerarda Fraction.    Satira Sark, M.D., F.A.C.C.

## 2014-08-24 NOTE — Assessment & Plan Note (Signed)
Managed by Dr. Gerarda Fraction.

## 2014-08-25 ENCOUNTER — Encounter (HOSPITAL_COMMUNITY)
Admission: RE | Admit: 2014-08-25 | Discharge: 2014-08-25 | Disposition: A | Discharge status: Home / Self Care | Payer: BC Managed Care – PPO | Source: Ambulatory Visit | Attending: Cardiology | Admitting: Cardiology

## 2014-08-25 ENCOUNTER — Ambulatory Visit (HOSPITAL_COMMUNITY)
Admission: RE | Admit: 2014-08-25 | Discharge: 2014-08-25 | Disposition: A | Discharge status: Home / Self Care | Payer: BC Managed Care – PPO | Source: Ambulatory Visit | Attending: Cardiology | Admitting: Cardiology

## 2014-08-25 ENCOUNTER — Encounter (HOSPITAL_COMMUNITY): Payer: Self-pay

## 2014-08-25 DIAGNOSIS — M549 Dorsalgia, unspecified: Secondary | ICD-10-CM | POA: Insufficient documentation

## 2014-08-25 DIAGNOSIS — R0600 Dyspnea, unspecified: Secondary | ICD-10-CM | POA: Insufficient documentation

## 2014-08-25 DIAGNOSIS — M79606 Pain in leg, unspecified: Secondary | ICD-10-CM | POA: Diagnosis not present

## 2014-08-25 DIAGNOSIS — Z87891 Personal history of nicotine dependence: Secondary | ICD-10-CM | POA: Insufficient documentation

## 2014-08-25 DIAGNOSIS — R0609 Other forms of dyspnea: Secondary | ICD-10-CM | POA: Diagnosis present

## 2014-08-25 DIAGNOSIS — I517 Cardiomegaly: Secondary | ICD-10-CM

## 2014-08-25 DIAGNOSIS — R0602 Shortness of breath: Secondary | ICD-10-CM

## 2014-08-25 MED ORDER — SODIUM CHLORIDE 0.9 % IJ SOLN
INTRAMUSCULAR | Status: AC
  Administered 2014-08-25: 10 mL via INTRAVENOUS
  Filled 2014-08-25: qty 10

## 2014-08-25 MED ORDER — TECHNETIUM TC 99M SESTAMIBI - CARDIOLITE
10.0000 | Freq: Once | INTRAVENOUS | Status: AC | PRN
  Administered 2014-08-25: 10 via INTRAVENOUS

## 2014-08-25 MED ORDER — REGADENOSON 0.4 MG/5ML IV SOLN
0.4000 mg | Freq: Once | INTRAVENOUS | Status: AC | PRN
  Administered 2014-08-25: 0.4 mg via INTRAVENOUS

## 2014-08-25 MED ORDER — SODIUM CHLORIDE 0.9 % IJ SOLN
10.0000 mL | INTRAMUSCULAR | Status: DC | PRN
  Administered 2014-08-25: 10 mL via INTRAVENOUS
  Filled 2014-08-25: qty 10

## 2014-08-25 MED ORDER — TECHNETIUM TC 99M SESTAMIBI GENERIC - CARDIOLITE
30.0000 | Freq: Once | INTRAVENOUS | Status: AC | PRN
  Administered 2014-08-25: 30 via INTRAVENOUS

## 2014-08-25 MED ORDER — REGADENOSON 0.4 MG/5ML IV SOLN
INTRAVENOUS | Status: AC
  Administered 2014-08-25: 0.4 mg via INTRAVENOUS
  Filled 2014-08-25: qty 5

## 2014-08-25 NOTE — Progress Notes (Signed)
  Echocardiogram 2D Echocardiogram has been performed.  [place], [place] 08/25/2014, 10:29 AM

## 2014-08-25 NOTE — Progress Notes (Signed)
Stress Lab Nurses Notes - Ibeth Fahmy 08/25/2014 Reason for doing test: Dyspnea Type of test: Test Changed unable to finish walking on treadmill, Lexiscan given & Cardiolite Nurse performing test: Gerrit Halls, RN Nuclear Medicine Tech: Melburn Hake Echo Tech: Not Applicable MD performing test: Koneswaran/K.Purcell Nails NP Family MD: Gerarda Fraction Test explained and consent signed: Yes.   IV started: Saline lock flushed, No redness or edema and Saline lock started in radiology Symptoms: Back pain and leg pain while on Treadmill, no symptoms with Lexiscan. Treatment/Intervention: None Reason test stopped: protocol completed and had some back & leg pain while on TM After recovery IV was: Discontinued via X-ray tech and No redness or edema Patient to return to [place]. Med at : 11:00 Patient discharged: Home Patient's Condition upon discharge was: stable Comments: During test peak (on TM) BP 145/81 & HR 120. During Lexiscan BP 124/76 & HR 125.  Recovery BP 103/73 & HR 82.  Symptoms resolved in recovery.  Geanie Cooley T

## 2014-08-26 ENCOUNTER — Telehealth: Payer: Self-pay | Admitting: *Deleted

## 2014-08-26 NOTE — Telephone Encounter (Signed)
-----   Message from Satira Sark, MD sent at 08/26/2014 10:39 AM EST ----- Reviewed. Please let her know that the stress test was low risk, did not show any clear evidence of obstructive CAD or prior myocardial infarction. LVEF normal at 55%. No further cardiac testing anticipated at this time, unless her symptoms worsen. She should keep follow-up with Dr. Gerarda Fraction.

## 2014-08-26 NOTE — Telephone Encounter (Signed)
Pt made aware of results, forwarded to Dr. Gerarda Fraction.

## 2014-12-22 ENCOUNTER — Other Ambulatory Visit (HOSPITAL_COMMUNITY): Payer: Self-pay | Admitting: Internal Medicine

## 2014-12-22 DIAGNOSIS — R109 Unspecified abdominal pain: Secondary | ICD-10-CM

## 2014-12-22 DIAGNOSIS — R140 Abdominal distension (gaseous): Secondary | ICD-10-CM

## 2014-12-22 DIAGNOSIS — R1310 Dysphagia, unspecified: Secondary | ICD-10-CM

## 2014-12-24 ENCOUNTER — Ambulatory Visit (HOSPITAL_COMMUNITY)
Admission: RE | Admit: 2014-12-24 | Discharge: 2014-12-24 | Disposition: A | Discharge status: Home / Self Care | Payer: Medicaid Other | Source: Ambulatory Visit | Attending: Internal Medicine | Admitting: Internal Medicine

## 2014-12-24 DIAGNOSIS — R140 Abdominal distension (gaseous): Secondary | ICD-10-CM

## 2014-12-24 DIAGNOSIS — Z87891 Personal history of nicotine dependence: Secondary | ICD-10-CM | POA: Insufficient documentation

## 2014-12-24 DIAGNOSIS — R1310 Dysphagia, unspecified: Secondary | ICD-10-CM

## 2014-12-24 DIAGNOSIS — R109 Unspecified abdominal pain: Secondary | ICD-10-CM | POA: Insufficient documentation

## 2014-12-24 DIAGNOSIS — Z8719 Personal history of other diseases of the digestive system: Secondary | ICD-10-CM | POA: Insufficient documentation

## 2015-01-06 ENCOUNTER — Encounter: Payer: Self-pay | Admitting: Internal Medicine

## 2015-01-29 ENCOUNTER — Ambulatory Visit: Payer: Medicaid Other | Admitting: Gastroenterology

## 2015-02-02 ENCOUNTER — Ambulatory Visit (INDEPENDENT_AMBULATORY_CARE_PROVIDER_SITE_OTHER): Payer: Medicaid Other | Admitting: Nurse Practitioner

## 2015-02-02 ENCOUNTER — Encounter: Payer: Self-pay | Admitting: Nurse Practitioner

## 2015-02-02 ENCOUNTER — Other Ambulatory Visit: Payer: Self-pay

## 2015-02-02 VITALS — BP 124/73 | HR 66 | Temp 97.0°F | Ht 62.0 in | Wt 146.2 lb

## 2015-02-02 DIAGNOSIS — R6881 Early satiety: Secondary | ICD-10-CM | POA: Diagnosis not present

## 2015-02-02 DIAGNOSIS — K3184 Gastroparesis: Secondary | ICD-10-CM

## 2015-02-02 DIAGNOSIS — K5900 Constipation, unspecified: Secondary | ICD-10-CM | POA: Diagnosis not present

## 2015-02-02 MED ORDER — LUBIPROSTONE 24 MCG PO CAPS: 24.0000 ug | ORAL_CAPSULE | Freq: Two times a day (BID) | ORAL | Status: DC

## 2015-02-02 NOTE — Assessment & Plan Note (Signed)
Patient with history of chronic constipation also with a history of irritable bowel syndrome. Has been on Linzess which seems to working okay for her, but not great. Still has a lot of sensation of incomplete emptying, fullness, and bloating. She is on chronic narcotic pain medication and Amitiza may be a better option for her. At this time we will have her hold her Linzess, we will provide her with samples of Amitiza 24 g as indicated for opioid-induced constipation. Have explained her to take this with food to prevent nausea. She'll try this for 2 weeks, call us and inform us how it is working for her. It is working well we can continue with a prescription, otherwise he can return to [facility] and consider other options. Return for follow-up in 3 months. I will also order an abdominal x-ray to look for increased stool burden.

## 2015-02-02 NOTE — Progress Notes (Signed)
cc'ed to pcp °

## 2015-02-02 NOTE — Assessment & Plan Note (Signed)
Patient also complains of early satiety in addition to her bloating and sensation of fullness. She could have an element of gastroparesis contributing to her symptoms. I'll order a gastric emptying study to further evaluate. Return for further evaluation of symptoms in progress in 3 months.

## 2015-02-02 NOTE — Progress Notes (Signed)
Primary Care Physician:  Glo Herring., MD Primary Gastroenterologist:  Dr. Gala Romney  Chief Complaint  Patient presents with  . set up TCS  . Abdominal Pain    HPI:   59 year old female presents on referral from PCP for abdominal pain, constipation, dysphagia. Last colonoscopy dated 11/27/2003 which found normal exam from cecum to descending colon. Edematous/thickened mucosa in the sigmoid colon with the biopsy taken. Internal and external grade 2 nonbleeding non thrombosed hemorrhoids. Advised to continue other anti-spasmodics PCP note reviewed. Per PCP office visit 12/22/14 patient was stomach pain which she describes gradual onset moderately severe and occurring daily. Nothing alleviates. Food aggravates. Also with chronic constipation with an Rx for every other day Linzess. History of GERD which is well controlled per the patient. Also complaining of intermittent dysphagia. Another EGD/screening colonoscopy found completed 05/20/2010. Colonoscopy with no polyps removed, left-sided diverticulosis, hemorrhoids. EGD with distal esophageal erosions, reflux esophagitis.  Today she states she recently had neck surgery about 3-4 months ago for a pinched nerve. Completed UGI with small bowel follow-through found normal appearing esophagus, normal upper GI exam, normal small bowel follow-through. States her abdominal pain has been chronic for many years. Has always had symptoms of fullness and bloating. Pain tends to be left-sided. Became worse about a year or so ago. Denies hematochezia, melena. Admits persistent nausea, denies vomiting. Denies fever, chills, unintentional weight loss, worsening fatigue/lethargy, syncope, near syncope. Has had some increasing fatigue since february. Has a bowel movement daily which is small. Has a sensation of incomplete emptying. Patient is on chronic narcotic pain medications. Also complains of feelsings of bloating and early satiety after eating. Denies any  additional upper or lower GI symptoms.  Past Medical History  Diagnosis Date  . Spinal headache   . Anxiety   . IBS (irritable bowel syndrome)   . Diverticulitis   . Heart murmur     As child  . Asthma   . GERD (gastroesophageal reflux disease)   . Arthritis   . Chronic pain syndrome     Past Surgical History  Procedure Laterality Date  . Cyst excision Right     Eye cyst  . Back surgery    . Cholecystectomy    . Appendectomy    . Colonoscopy  05/20/10    OBS:JGGEZMOQ hemorrhoids otherwise normal/EGD:distal esophageal erosions otherwise normal  . Tubal ligation    . Abdominal hysterectomy    . Posterior cervical fusion/foraminotomy N/A 04/10/2013    Procedure: POSTERIOR CERVICAL FUSION/FORAMINOTOMY LEVEL 2;  Surgeon: Sinclair Ship, MD;  Location: [facility];  Service: Orthopedics;  Laterality: N/A;  Posterior cervical decompression fusion, cervical 5-6, cervical 6-7 with instrumentation and allograft.    Current Outpatient Prescriptions  Medication Sig Dispense Refill  . ALPRAZolam (XANAX) 1 MG tablet Take 1 mg by mouth daily as needed for anxiety.    Marland Kitchen estrogens, conjugated, (PREMARIN) 0.625 MG tablet Take 0.625 mg by mouth daily with breakfast. Take daily for 21 days then do not take for 7 days.    Marland Kitchen LINZESS 290 MCG CAPS capsule Take 290 mcg by mouth daily.  2  . oxyCODONE-acetaminophen (PERCOCET) 10-325 MG per tablet Take 1 tablet by mouth every 4 (four) hours as needed.   0  . promethazine (PHENERGAN) 12.5 MG tablet Take 1 tablet (12.5 mg total) by mouth every 6 (six) hours as needed for nausea. 40 tablet 0  . hyoscyamine (LEVBID) 0.375 MG 12 hr tablet Take 0.375 mg by mouth 2 (two) times  daily.   1   No current facility-administered medications for this visit.    Allergies as of 02/02/2015 - Review Complete 02/02/2015  Allergen Reaction Noted  . Clindamycin hcl Anaphylaxis   . Codeine Nausea Only     Family History  Problem Relation Age of Onset  . Hypertension  Mother   . Emphysema Mother   . Cancer Father   . Hypertension Other   . Diabetes Other     History   Social History  . Marital Status: Married    Spouse Name: N/A  . Number of Children: N/A  . Years of Education: N/A   Occupational History  . Not on file.   Social History Main Topics  . Smoking status: Former Smoker    Types: Cigarettes    Quit date: 08/25/2007  . Smokeless tobacco: Never Used  . Alcohol Use: No  . Drug Use: No  . Sexual Activity: Not on file   Other Topics Concern  . Not on file   Social History Narrative    Review of Systems: General: Negative for anorexia, weight loss, fever, chills, fatigue, weakness. Eyes: Negative for vision changes.  ENT: Negative for hoarseness, difficulty swallowing , nasal congestion. CV: Negative for chest pain, angina, palpitations, dyspnea on exertion, peripheral edema.  Respiratory: Negative for dyspnea at rest, dyspnea on exertion, cough, sputum, wheezing.  GI: See history of present illness. GU:  Negative for dysuria, hematuria, urinary incontinence, urinary frequency, nocturnal urination.  MS: Negative for joint pain, low back pain.  Derm: Negative for rash or itching.  Neuro: Negative for weakness, abnormal sensation, seizure, frequent headaches, memory loss, confusion.  Psych: Negative for anxiety, depression, suicidal ideation, hallucinations.  Endo: Negative for unusual weight change.  Heme: Negative for bruising or bleeding. Allergy: Negative for rash or hives.    Physical Exam: BP 124/73 mmHg  Pulse 66  Temp(Src) 97 F (36.1 C)  Ht 5\' 2"  (1.575 m)  Wt 146 lb 3.2 oz (66.316 kg)  BMI 26.73 kg/m2 General:   Alert and oriented. Pleasant and cooperative. Well-nourished and well-developed.  Head:  Normocephalic and atraumatic. Eyes:  Without icterus, sclera clear and conjunctiva pink.  Ears:  Normal auditory acuity. Mouth:  No deformity or lesions, oral mucosa pink.  Throat/Neck:  Supple, without mass  or thyromegaly. Cardiovascular:  S1, S2 present without murmurs appreciated. Normal pulses noted. Extremities without clubbing or edema. Respiratory:  Clear to auscultation bilaterally. No wheezes, rales, or rhonchi. No distress.  Gastrointestinal:  +BS, soft, non-tender and non-distended. No HSM noted. No guarding or rebound. No masses appreciated.  Rectal:  Deferred  Musculoskalatal:  Symmetrical without gross deformities. Normal posture. Skin:  Intact without significant lesions or rashes. Neurologic:  Alert and oriented x4;  grossly normal neurologically. Psych:  Alert and cooperative. Normal mood and affect. Heme/Lymph/Immune: No significant cervical adenopathy. No excessive bruising noted.    02/02/2015 2:08 PM

## 2015-02-02 NOTE — Patient Instructions (Signed)
1. Please have your abdominal x-ray done 1 year able to. 2. Hold Linzess for now. 3. Start Amitiza 24 g twice a day. Take this medication with food to help decrease the likelihood of becoming nauseous. 4. Call us in 2 weeks to let us know how Amitiza is working for you. If it is working well we can continue with a prescription, otherwise we can go back to [place]. 5. I've ordered a gastric emptying study to further evaluate her symptoms as well. Please have this done when you're able. 6. Return for follow-up in 3 months.

## 2015-02-05 ENCOUNTER — Ambulatory Visit: Payer: Medicaid Other | Admitting: Gastroenterology

## 2015-02-09 ENCOUNTER — Encounter (HOSPITAL_COMMUNITY): Payer: Medicaid Other

## 2015-04-22 ENCOUNTER — Ambulatory Visit (INDEPENDENT_AMBULATORY_CARE_PROVIDER_SITE_OTHER): Payer: Medicaid Other | Admitting: Otolaryngology

## 2015-05-05 ENCOUNTER — Ambulatory Visit: Payer: Medicaid Other | Admitting: Nurse Practitioner

## 2015-05-24 ENCOUNTER — Ambulatory Visit: Payer: Medicaid Other | Admitting: Nurse Practitioner

## 2015-06-03 ENCOUNTER — Other Ambulatory Visit (INDEPENDENT_AMBULATORY_CARE_PROVIDER_SITE_OTHER): Payer: Self-pay | Admitting: Otolaryngology

## 2015-06-03 ENCOUNTER — Ambulatory Visit (INDEPENDENT_AMBULATORY_CARE_PROVIDER_SITE_OTHER): Payer: Medicaid Other | Admitting: Otolaryngology

## 2015-06-03 DIAGNOSIS — H6983 Other specified disorders of Eustachian tube, bilateral: Secondary | ICD-10-CM

## 2015-06-03 DIAGNOSIS — R1312 Dysphagia, oropharyngeal phase: Secondary | ICD-10-CM

## 2015-06-03 DIAGNOSIS — R1310 Dysphagia, unspecified: Secondary | ICD-10-CM

## 2015-06-11 ENCOUNTER — Ambulatory Visit (HOSPITAL_COMMUNITY)
Admission: RE | Admit: 2015-06-11 | Discharge: 2015-06-11 | Disposition: A | Discharge status: Home / Self Care | Payer: Medicaid Other | Source: Ambulatory Visit | Attending: Otolaryngology | Admitting: Otolaryngology

## 2015-06-11 DIAGNOSIS — R1319 Other dysphagia: Secondary | ICD-10-CM | POA: Insufficient documentation

## 2015-06-11 DIAGNOSIS — K449 Diaphragmatic hernia without obstruction or gangrene: Secondary | ICD-10-CM | POA: Insufficient documentation

## 2015-06-11 DIAGNOSIS — R1310 Dysphagia, unspecified: Secondary | ICD-10-CM

## 2015-11-29 ENCOUNTER — Other Ambulatory Visit (HOSPITAL_COMMUNITY): Payer: Self-pay | Admitting: Internal Medicine

## 2015-11-29 DIAGNOSIS — Z1231 Encounter for screening mammogram for malignant neoplasm of breast: Secondary | ICD-10-CM

## 2015-12-02 ENCOUNTER — Ambulatory Visit (HOSPITAL_COMMUNITY)
Admission: RE | Admit: 2015-12-02 | Discharge: 2015-12-02 | Disposition: A | Discharge status: Home / Self Care | Payer: Medicaid Other | Source: Ambulatory Visit | Attending: Internal Medicine | Admitting: Internal Medicine

## 2015-12-02 DIAGNOSIS — Z1231 Encounter for screening mammogram for malignant neoplasm of breast: Secondary | ICD-10-CM | POA: Diagnosis present

## 2015-12-06 ENCOUNTER — Other Ambulatory Visit (HOSPITAL_COMMUNITY): Payer: Self-pay | Admitting: Neurosurgery

## 2015-12-06 DIAGNOSIS — M5416 Radiculopathy, lumbar region: Secondary | ICD-10-CM

## 2015-12-10 ENCOUNTER — Ambulatory Visit (HOSPITAL_COMMUNITY)
Admission: RE | Admit: 2015-12-10 | Discharge: 2015-12-10 | Disposition: A | Discharge status: Home / Self Care | Payer: Medicaid Other | Source: Ambulatory Visit | Attending: Neurosurgery | Admitting: Neurosurgery

## 2015-12-10 DIAGNOSIS — M5126 Other intervertebral disc displacement, lumbar region: Secondary | ICD-10-CM | POA: Insufficient documentation

## 2015-12-10 DIAGNOSIS — M898X8 Other specified disorders of bone, other site: Secondary | ICD-10-CM | POA: Diagnosis not present

## 2015-12-10 DIAGNOSIS — M5416 Radiculopathy, lumbar region: Secondary | ICD-10-CM | POA: Diagnosis present

## 2015-12-10 DIAGNOSIS — Z9889 Other specified postprocedural states: Secondary | ICD-10-CM | POA: Insufficient documentation

## 2015-12-10 MED ORDER — GADOBENATE DIMEGLUMINE 529 MG/ML IV SOLN
15.0000 mL | Freq: Once | INTRAVENOUS | Status: AC | PRN
  Administered 2015-12-10: 13 mL via INTRAVENOUS

## 2016-01-31 IMAGING — RF DG MYELOGRAPHY LUMBAR INJ CERVICAL
12 series · 12 of 12 positions shown · non-contrast
Comparison: none

CLINICAL DATA: Neck pain.  LEFT arm pain.  Multiple prior surgeries
TECHNIQUE: Contiguous axial images were obtained through the Cervical spine
after the intrathecal infusion of infusion. Coronal and sagittal
reconstructions were obtained of the axial image sets.

[Series 1: (hospital) · 1 of 1 slices shown]
[im 1/1]
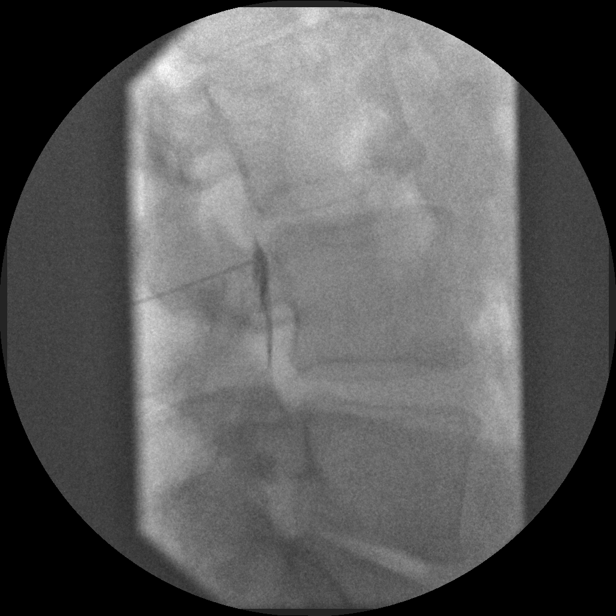

[Series 2: myelogram  white · 1 of 1 slices shown (1 of 11)]
[im 1/1]
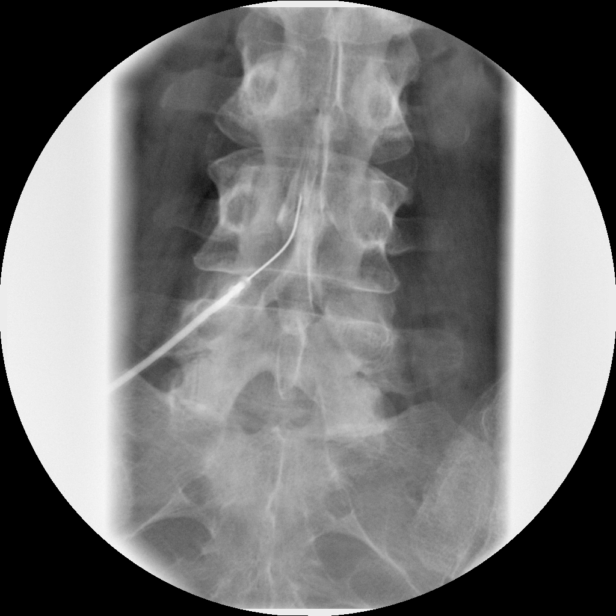

[Series 3: myelogram  white · 1 of 1 slices shown (2 of 11)]
[im 1/1]
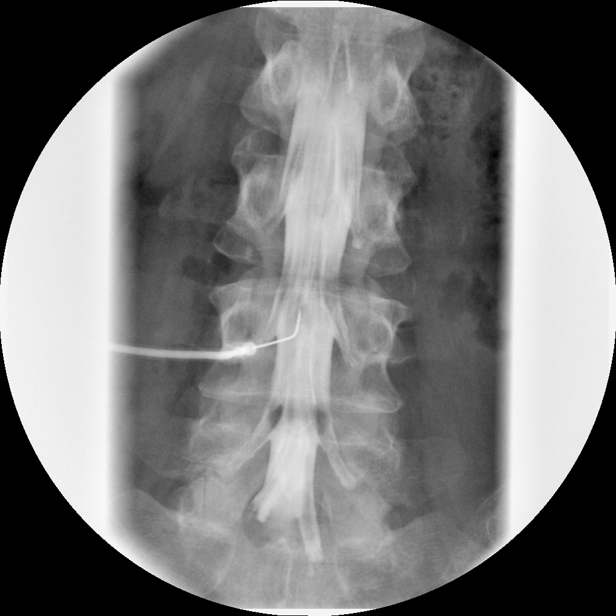

[Series 4: myelogram  white · 1 of 1 slices shown (3 of 11)]
[im 1/1]
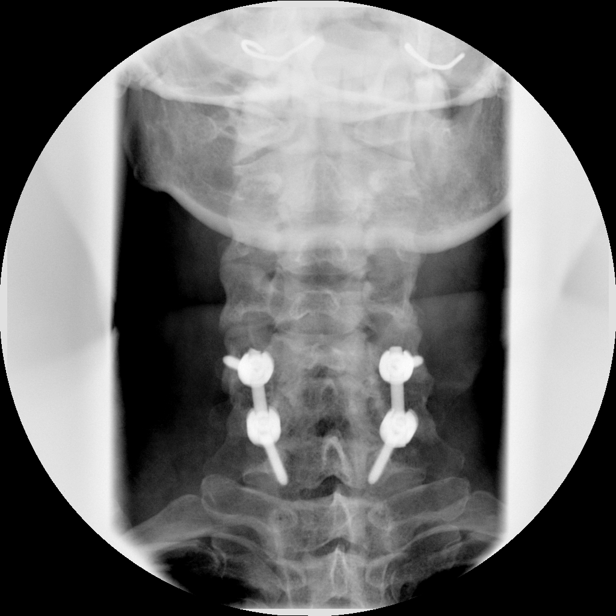

[Series 5: myelogram  white · 1 of 1 slices shown (4 of 11)]
[im 1/1]
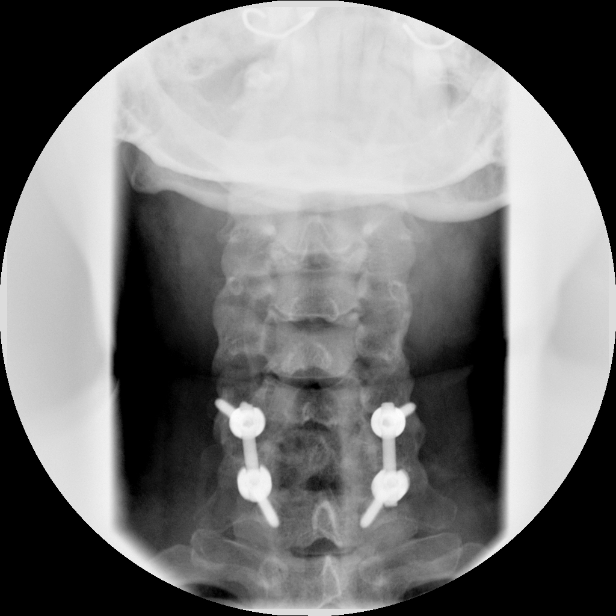

[Series 6: myelogram  white · 1 of 1 slices shown (5 of 11)]
[im 1/1]
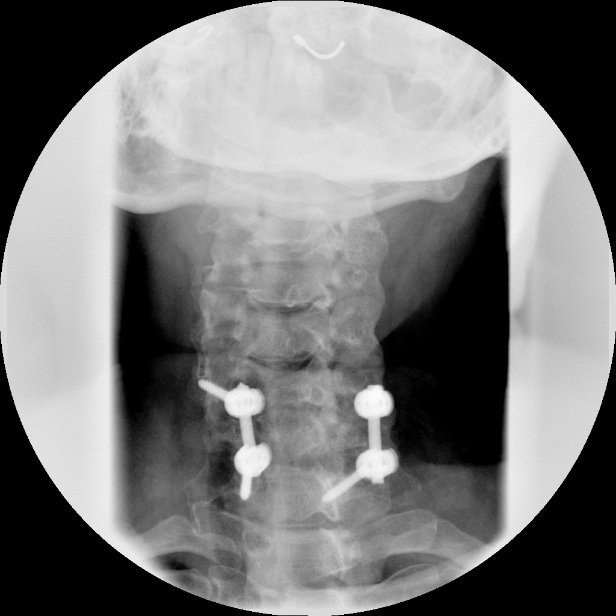

[Series 7: myelogram  white · 1 of 1 slices shown (6 of 11)]
[im 1/1]
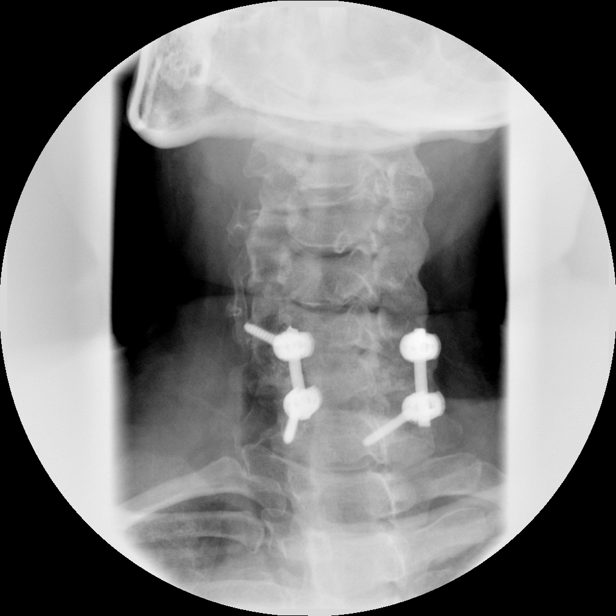

[Series 8: myelogram  white · 1 of 1 slices shown (7 of 11)]
[im 1/1]
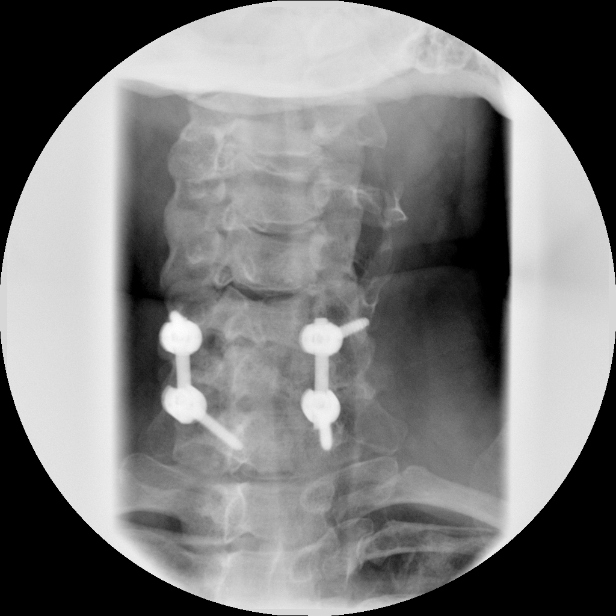

[Series 9: myelogram  white · 1 of 1 slices shown (8 of 11)]
[im 1/1]
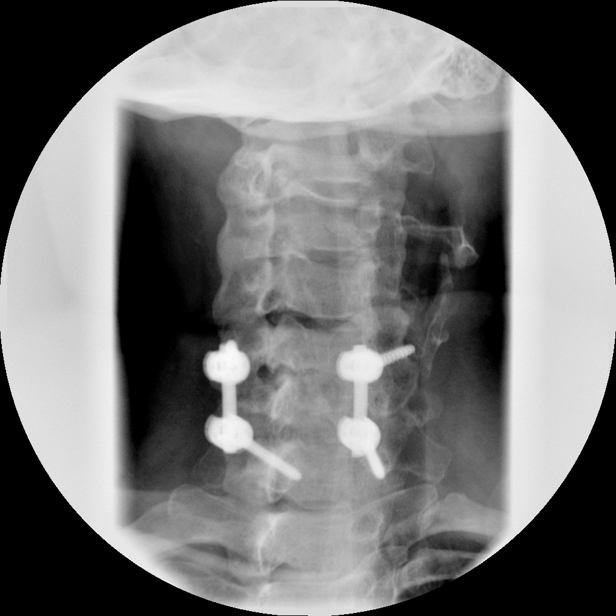

[Series 10: myelogram  white · 1 of 1 slices shown (9 of 11)]
[im 1/1]
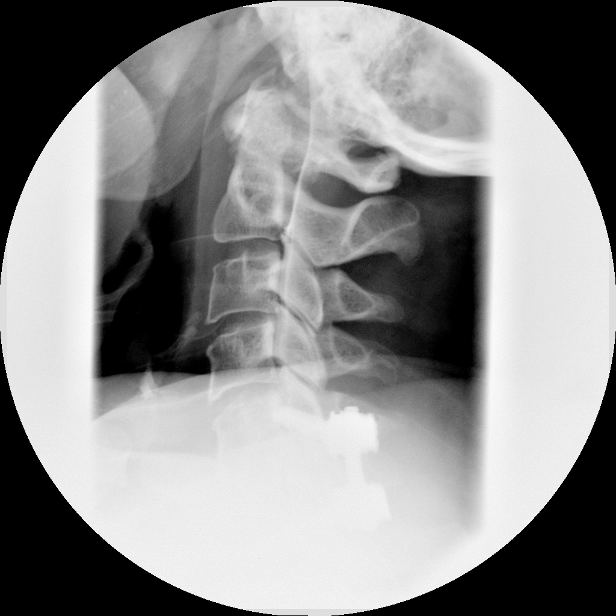

[Series 11: myelogram  white · 1 of 1 slices shown (10 of 11)]
[im 1/1]
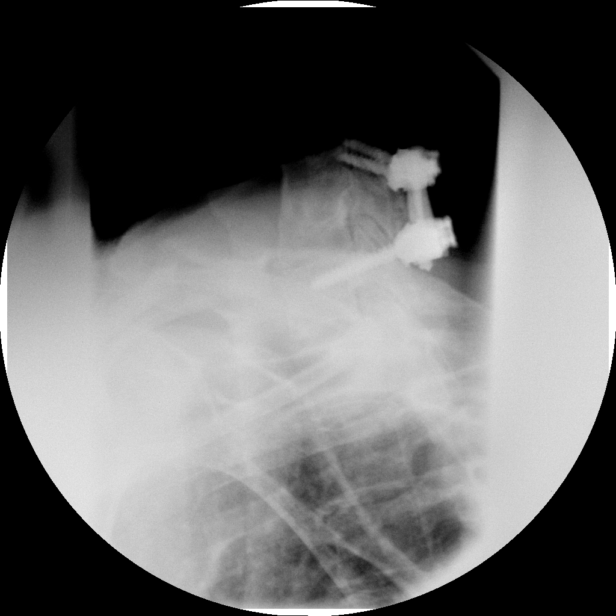

[Series 12: myelogram  white · 1 of 1 slices shown (11 of 11)]
[im 1/1]
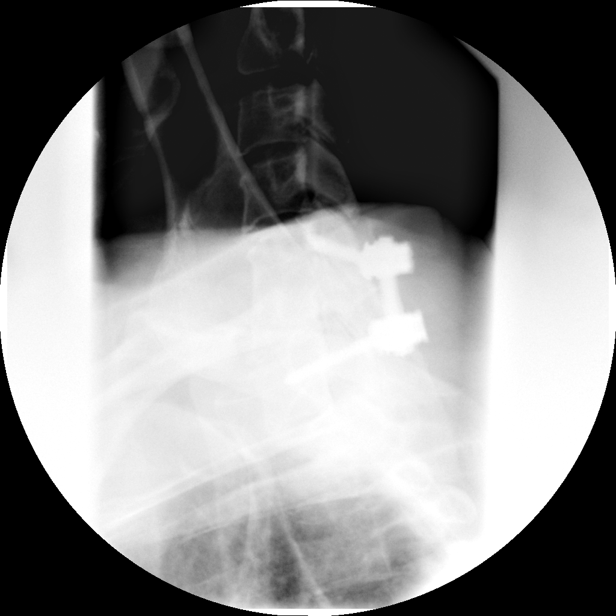

[12 of 12 positions shown; findings below may reference images not displayed]

FLUOROSCOPY TIME:  1 min 26 seconds.

PROCEDURE:
LUMBAR PUNCTURE FOR CERVICAL MYELOGRAM

After thorough discussion of risks and benefits of the procedure
including bleeding, infection, injury to nerves, blood vessels,
adjacent structures as well as headache and CSF leak, written and
oral informed consent was obtained. Consent was obtained by Dr. Toydemir
Bislimi. We discussed the high likelihood of obtaining a diagnostic
study.

Patient was positioned prone on the fluoroscopy table. Local
anesthesia was provided with 1% lidocaine without epinephrine after
prepped and draped in the usual sterile fashion. Puncture was
performed at L3-L4 using a 3 1/2 inch 22-gauge spinal needle via
LEFT paramedian approach. Using a single pass through the dura, the
needle was placed within the thecal sac, with return of clear CSF.
10 mL of Rmnipaque-V00 was injected into the thecal sac, with normal
opacification of the nerve roots and cauda equina consistent with
free flow within the subarachnoid space. The patient was then moved
to the trendelenburg position and contrast flowed into the Cervical
spine region.

I personally performed the lumbar puncture and administered the
intrathecal contrast. I also personally supervised acquisition of
the myelogram images.
FINDINGS: CERVICAL MYELOGRAM FINDINGS:

Good opacification cervical subarachnoid space. Suspected previous
anterior cervical fusion C5-6. No anterior instrumentation. Previous
posterior fusion from C5 through C7. No spinal stenosis or cord
compression. Possible extradural defect at C4-5 level on the LEFT.

CT CERVICAL MYELOGRAM FINDINGS:

No tonsillar herniation.  No cord compression.

Apical bleb on the RIGHT, with no lung nodule. Mild vascular
calcification.

The individual disc spaces were examined as follows:

C2-3:  Normal.

C3-4:  Normal.

C4-5: Shallow protrusion on the LEFT. This effaces the LEFT C5 nerve
root (image 36 series 2 and series 3). No spinal stenosis or cord
compression.

C5-6: Solid anterior and posterior fusion. C5 lateral mass screws
unremarkable. Residual uncinate spurring on the LEFT without C6
nerve root compression.

C6-7: Hypoplastic disc space. No definite posterior fusion changes
anteriorly. Pedicle screws are seen at C7 on the RIGHT and LEFT
across which there is a solid C5-C7 fusion. No neural impingement.

C7-T1:  Unremarkable.

Compared with prior MR from [HOSPITAL] 02/12/2014, there
does appear to be an extradural defect at C4-5 on the LEFT.
IMPRESSION: Unremarkable appearing C5-C7 fusion without residual impingement at
this level.

Shallow leftward protrusion at C4-5. LEFT C5 nerve root impingement.

## 2016-03-06 ENCOUNTER — Other Ambulatory Visit (HOSPITAL_COMMUNITY): Payer: Self-pay | Admitting: Internal Medicine

## 2016-03-06 ENCOUNTER — Ambulatory Visit (HOSPITAL_COMMUNITY)
Admission: RE | Admit: 2016-03-06 | Discharge: 2016-03-06 | Disposition: A | Discharge status: Home / Self Care | Payer: BLUE CROSS/BLUE SHIELD | Source: Ambulatory Visit | Attending: Internal Medicine | Admitting: Internal Medicine

## 2016-03-06 DIAGNOSIS — R1013 Epigastric pain: Secondary | ICD-10-CM

## 2016-03-06 DIAGNOSIS — R109 Unspecified abdominal pain: Secondary | ICD-10-CM | POA: Diagnosis not present

## 2016-06-02 ENCOUNTER — Other Ambulatory Visit: Payer: Self-pay | Admitting: Neurosurgery

## 2016-06-05 ENCOUNTER — Ambulatory Visit (INDEPENDENT_AMBULATORY_CARE_PROVIDER_SITE_OTHER): Payer: BLUE CROSS/BLUE SHIELD | Admitting: Otolaryngology

## 2016-06-05 DIAGNOSIS — R1312 Dysphagia, oropharyngeal phase: Secondary | ICD-10-CM

## 2016-06-05 DIAGNOSIS — R490 Dysphonia: Secondary | ICD-10-CM

## 2016-06-28 ENCOUNTER — Other Ambulatory Visit (HOSPITAL_COMMUNITY): Payer: BLUE CROSS/BLUE SHIELD

## 2016-07-04 ENCOUNTER — Encounter (HOSPITAL_COMMUNITY): Payer: Self-pay

## 2016-07-04 NOTE — Pre-Procedure Instructions (Signed)
Gina Wolfe  07/04/2016      [id number] - [place], [place] - [address] [place] [address] Phone: [phone] Fax: [phone]    Your procedure is scheduled on Thursday November 2.  Report to [facility] Admitting at 5:30 A.M.  Call this number if you have problems the morning of surgery:  [phone]   Remember:  Do not eat food or drink liquids after midnight.  Take these medicines the morning of surgery with A SIP OF WATER: alprazolam (Xanax) if needed, Flexeril if needed, Premarin, oxycodone (percocet) if needed  7 days prior to surgery STOP taking any Aspirin, Aleve, Naproxen, Ibuprofen, Motrin, Advil, Goody's, BC's, all herbal medications, fish oil, and all vitamins    Do not wear jewelry, make-up or nail polish.  Do not wear lotions, powders, or perfumes, or deoderant.  Do not shave 48 hours prior to surgery.   Do not bring valuables to the hospital.  [facility] is not responsible for any belongings or valuables.  Contacts, dentures or bridgework may not be worn into surgery.  Leave your suitcase in the car.  After surgery it may be brought to your room.  For patients admitted to the hospital, discharge time will be determined by your treatment team.  Patients discharged the day of surgery will not be allowed to drive home.    Special instructions:     [place]- Preparing For Surgery  Before surgery, you can play an important role. Because skin is not sterile, your skin needs to be as free of germs as possible. You can reduce the number of germs on your skin by washing with CHG (chlorahexidine gluconate) Soap before surgery.  CHG is an antiseptic cleaner which kills germs and bonds with the skin to continue killing germs even after washing.  Please do not use if you have an allergy to CHG or antibacterial soaps. If your skin becomes reddened/irritated stop using the CHG.  Do not shave (including  legs and underarms) for at least 48 hours prior to first CHG shower. It is OK to shave your face.  Please follow these instructions carefully.   1. Shower the NIGHT BEFORE SURGERY and the MORNING OF SURGERY with CHG.   2. If you chose to wash your hair, wash your hair first as usual with your normal shampoo.  3. After you shampoo, rinse your hair and body thoroughly to remove the shampoo.  4. Use CHG as you would any other liquid soap. You can apply CHG directly to the skin and wash gently with a scrungie or a clean washcloth.   5. Apply the CHG Soap to your body ONLY FROM THE NECK DOWN.  Do not use on open wounds or open sores. Avoid contact with your eyes, ears, mouth and genitals (private parts). Wash genitals (private parts) with your normal soap.  6. Wash thoroughly, paying special attention to the area where your surgery will be performed.  7. Thoroughly rinse your body with warm water from the neck down.  8. DO NOT shower/wash with your normal soap after using and rinsing off the CHG Soap.  9. Pat yourself dry with a CLEAN TOWEL.   10. Wear CLEAN PAJAMAS   11. Place CLEAN SHEETS on your bed the night of your first shower and DO NOT SLEEP WITH PETS.    Day of Surgery: Do not apply any deodorants/lotions. Please wear clean clothes to the hospital/surgery  center.      Please read over the following fact sheets that you were given. Pain Booklet, Coughing and Deep Breathing, MRSA Information and Surgical Site Infection Prevention

## 2016-07-05 ENCOUNTER — Encounter (HOSPITAL_COMMUNITY)
Admission: RE | Admit: 2016-07-05 | Discharge: 2016-07-05 | Disposition: A | Discharge status: Home / Self Care | Payer: BLUE CROSS/BLUE SHIELD | Source: Ambulatory Visit | Attending: Neurosurgery | Admitting: Neurosurgery

## 2016-07-05 ENCOUNTER — Encounter (HOSPITAL_COMMUNITY): Payer: Self-pay

## 2016-07-05 DIAGNOSIS — Z01812 Encounter for preprocedural laboratory examination: Secondary | ICD-10-CM | POA: Insufficient documentation

## 2016-07-05 HISTORY — DX: Dyspnea, unspecified: R06.00

## 2016-07-05 HISTORY — DX: Chronic obstructive pulmonary disease, unspecified: J44.9

## 2016-07-05 LAB — CBC
HEMATOCRIT: 41.1 % (ref 36.0–46.0)
HEMOGLOBIN: 13.7 g/dL (ref 12.0–15.0)
MCH: 30.9 pg (ref 26.0–34.0)
MCHC: 33.3 g/dL (ref 30.0–36.0)
MCV: 92.6 fL (ref 78.0–100.0)
Platelets: 300 10*3/uL (ref 150–400)
RBC: 4.44 MIL/uL (ref 3.87–5.11)
RDW: 12.7 % (ref 11.5–15.5)
WBC: 6.8 10*3/uL (ref 4.0–10.5)

## 2016-07-05 LAB — BASIC METABOLIC PANEL
ANION GAP: 8 (ref 5–15)
BUN: 9 mg/dL (ref 6–20)
CHLORIDE: 105 mmol/L (ref 101–111)
CO2: 26 mmol/L (ref 22–32)
Calcium: 8.8 mg/dL — ABNORMAL LOW (ref 8.9–10.3)
Creatinine, Ser: 0.68 mg/dL (ref 0.44–1.00)
GFR calc Af Amer: >60 mL/min (ref >60)
GFR calc non Af Amer: >60 mL/min (ref >60)
GLUCOSE: 73 mg/dL (ref 65–99)
POTASSIUM: 3.9 mmol/L (ref 3.5–5.1)
Sodium: 139 mmol/L (ref 135–145)

## 2016-07-05 LAB — SURGICAL PCR SCREEN
MRSA, PCR: NEGATIVE
Staphylococcus aureus: NEGATIVE

## 2016-07-05 NOTE — Progress Notes (Signed)
Pt. Was unaware of MRSA history in the past.  She reports that she had a bug bite on her cheek & she went for treatment /w IV antibiotics but didn't know it was MRSA. Pt. Reports that PCP is FUSCO, states the stress test that was done 2014 was normal & dr. Domenic Polite didn't require for her to be seen again.  Pt. Denies chest concerns, flu symptoms, coughing; reports that she is well at the present.

## 2016-07-12 ENCOUNTER — Encounter (HOSPITAL_COMMUNITY): Payer: Self-pay | Admitting: Anesthesiology

## 2016-07-12 NOTE — Anesthesia Preprocedure Evaluation (Addendum)
Anesthesia Evaluation  Patient identified by MRN, date of birth, ID band Patient awake    Reviewed: Allergy & Precautions, NPO status , Patient's Chart, lab work & pertinent test results  History of Anesthesia Complications (+) POST - OP SPINAL HEADACHE and history of anesthetic complications  Airway Mallampati: III  TM Distance: >3 FB Neck ROM: Limited  Mouth opening: Limited Mouth Opening  Dental  (+) Partial Upper, Teeth Intact, Dental Advisory Given   Pulmonary shortness of breath and with exertion, asthma , COPD,  COPD inhaler, former smoker,    Pulmonary exam normal breath sounds clear to auscultation       Cardiovascular negative cardio ROS Normal cardiovascular exam+ Valvular Problems/Murmurs  Rhythm:Regular Rate:Normal     Neuro/Psych  Headaches, Anxiety Chronic pain syndrome    GI/Hepatic Neg liver ROS, GERD  ,IBS Diverticulitis   Endo/Other  negative endocrine ROS  Renal/GU negative Renal ROS  negative genitourinary   Musculoskeletal  (+) Arthritis , Osteoarthritis,  Lumbar radiculopathy Left>Right   Abdominal   Peds  Hematology negative hematology ROS (+)   Anesthesia Other Findings   Reproductive/Obstetrics negative OB ROS                          Lab Results  Component Value Date   WBC 6.8 07/05/2016   HGB 13.7 07/05/2016   HCT 41.1 07/05/2016   MCV 92.6 07/05/2016   PLT 300 07/05/2016     Chemistry      Component Value Date/Time   NA 139 07/05/2016 1026   K 3.9 07/05/2016 1026   CL 105 07/05/2016 1026   CO2 26 07/05/2016 1026   BUN 9 07/05/2016 1026   CREATININE 0.68 07/05/2016 1026      Component Value Date/Time   CALCIUM 8.8 (L) 07/05/2016 1026   ALKPHOS 53 04/04/2013 1525   AST 18 04/04/2013 1525   ALT 18 04/04/2013 1525   BILITOT 0.2 (L) 04/04/2013 1525     EKG 2015: normal EKG, normal sinus rhythm, low voltage. Echo 08/2014:Left ventricle: The cavity  size was normal. Wall thickness was increased in a pattern of mild LVH. Systolic function was normal. The estimated ejection fraction was in the range of 60% to 65%. Wall motion was normal; there were no regional wall motion abnormalities. Findings consistent with left ventricular diastolic dysfunction. There was no evidence of elevated ventricular filling pressure by Doppler parameters.  Anesthesia Physical Anesthesia Plan  ASA: II  Anesthesia Plan: General   Post-op Pain Management:    Induction: Intravenous  Airway Management Planned: Oral ETT and Video Laryngoscope Planned  Additional Equipment:   Intra-op Plan:   Post-operative Plan: Extubation in OR  Informed Consent: I have reviewed the patients History and Physical, chart, labs and discussed the procedure including the risks, benefits and alternatives for the proposed anesthesia with the patient or authorized representative who has indicated his/her understanding and acceptance.   Dental advisory given  Plan Discussed with: Anesthesiologist, CRNA and Surgeon  Anesthesia Plan Comments: (Use Glidescope for intubation as she has limited Cervical ROM)       Anesthesia Quick Evaluation

## 2016-07-13 ENCOUNTER — Ambulatory Visit (HOSPITAL_COMMUNITY)
Admission: RE | Admit: 2016-07-13 | Discharge: 2016-07-14 | Disposition: A | Discharge status: Home / Self Care | Payer: BLUE CROSS/BLUE SHIELD | Source: Ambulatory Visit | Attending: Neurosurgery | Admitting: Neurosurgery

## 2016-07-13 ENCOUNTER — Ambulatory Visit (HOSPITAL_COMMUNITY): Payer: BLUE CROSS/BLUE SHIELD | Admitting: Anesthesiology

## 2016-07-13 ENCOUNTER — Encounter (HOSPITAL_COMMUNITY): Payer: Self-pay | Admitting: Anesthesiology

## 2016-07-13 ENCOUNTER — Ambulatory Visit (HOSPITAL_COMMUNITY): Payer: BLUE CROSS/BLUE SHIELD

## 2016-07-13 ENCOUNTER — Encounter (HOSPITAL_COMMUNITY)
Admission: RE | Disposition: A | Discharge status: Home / Self Care | Payer: Self-pay | Source: Ambulatory Visit | Attending: Neurosurgery

## 2016-07-13 DIAGNOSIS — Z90710 Acquired absence of both cervix and uterus: Secondary | ICD-10-CM | POA: Diagnosis not present

## 2016-07-13 DIAGNOSIS — K589 Irritable bowel syndrome without diarrhea: Secondary | ICD-10-CM | POA: Diagnosis not present

## 2016-07-13 DIAGNOSIS — Z8249 Family history of ischemic heart disease and other diseases of the circulatory system: Secondary | ICD-10-CM | POA: Diagnosis not present

## 2016-07-13 DIAGNOSIS — Z809 Family history of malignant neoplasm, unspecified: Secondary | ICD-10-CM | POA: Diagnosis not present

## 2016-07-13 DIAGNOSIS — J449 Chronic obstructive pulmonary disease, unspecified: Secondary | ICD-10-CM | POA: Insufficient documentation

## 2016-07-13 DIAGNOSIS — M5126 Other intervertebral disc displacement, lumbar region: Secondary | ICD-10-CM | POA: Diagnosis present

## 2016-07-13 DIAGNOSIS — Z881 Allergy status to other antibiotic agents status: Secondary | ICD-10-CM | POA: Diagnosis not present

## 2016-07-13 DIAGNOSIS — Z87891 Personal history of nicotine dependence: Secondary | ICD-10-CM | POA: Diagnosis not present

## 2016-07-13 DIAGNOSIS — G894 Chronic pain syndrome: Secondary | ICD-10-CM | POA: Insufficient documentation

## 2016-07-13 DIAGNOSIS — M1990 Unspecified osteoarthritis, unspecified site: Secondary | ICD-10-CM | POA: Diagnosis not present

## 2016-07-13 DIAGNOSIS — K219 Gastro-esophageal reflux disease without esophagitis: Secondary | ICD-10-CM | POA: Diagnosis not present

## 2016-07-13 DIAGNOSIS — M5116 Intervertebral disc disorders with radiculopathy, lumbar region: Secondary | ICD-10-CM | POA: Insufficient documentation

## 2016-07-13 DIAGNOSIS — F419 Anxiety disorder, unspecified: Secondary | ICD-10-CM | POA: Insufficient documentation

## 2016-07-13 DIAGNOSIS — Z885 Allergy status to narcotic agent status: Secondary | ICD-10-CM | POA: Diagnosis not present

## 2016-07-13 DIAGNOSIS — Z419 Encounter for procedure for purposes other than remedying health state, unspecified: Secondary | ICD-10-CM

## 2016-07-13 HISTORY — PX: LUMBAR LAMINECTOMY/DECOMPRESSION MICRODISCECTOMY: SHX5026

## 2016-07-13 SURGERY — LUMBAR LAMINECTOMY/DECOMPRESSION MICRODISCECTOMY 1 LEVEL
Anesthesia: General

## 2016-07-13 MED ORDER — PROPOFOL 10 MG/ML IV BOLUS
INTRAVENOUS | Status: AC
  Filled 2016-07-13: qty 20

## 2016-07-13 MED ORDER — METOCLOPRAMIDE HCL 5 MG/ML IJ SOLN
INTRAMUSCULAR | Status: DC | PRN
  Administered 2016-07-13: 5 mg via INTRAVENOUS

## 2016-07-13 MED ORDER — HYDROMORPHONE HCL 1 MG/ML IJ SOLN: 0.2500 mg | INTRAMUSCULAR | Status: DC | PRN

## 2016-07-13 MED ORDER — SODIUM CHLORIDE 0.9 % IR SOLN
Status: DC | PRN
  Administered 2016-07-13: 07:00:00

## 2016-07-13 MED ORDER — ALPRAZOLAM 0.5 MG PO TABS
1.0000 mg | ORAL_TABLET | Freq: Four times a day (QID) | ORAL | Status: DC | PRN
Start: 2016-07-13 — End: 2016-07-14
  Filled 2016-07-13: qty 2

## 2016-07-13 MED ORDER — CYCLOBENZAPRINE HCL 10 MG PO TABS
10.0000 mg | ORAL_TABLET | Freq: Three times a day (TID) | ORAL | Status: DC | PRN
  Administered 2016-07-13: 10 mg via ORAL
  Filled 2016-07-13: qty 1

## 2016-07-13 MED ORDER — LACTATED RINGERS IV SOLN: INTRAVENOUS | Status: DC

## 2016-07-13 MED ORDER — DEXAMETHASONE SODIUM PHOSPHATE 10 MG/ML IJ SOLN
INTRAMUSCULAR | Status: DC | PRN
  Administered 2016-07-13: 10 mg via INTRAVENOUS

## 2016-07-13 MED ORDER — LINACLOTIDE 290 MCG PO CAPS
290.0000 ug | ORAL_CAPSULE | Freq: Every day | ORAL | Status: DC
  Administered 2016-07-14: 290 ug via ORAL
  Filled 2016-07-13: qty 1

## 2016-07-13 MED ORDER — ROCURONIUM BROMIDE 100 MG/10ML IV SOLN
INTRAVENOUS | Status: DC | PRN
  Administered 2016-07-13: 10 mg via INTRAVENOUS
  Administered 2016-07-13: 30 mg via INTRAVENOUS

## 2016-07-13 MED ORDER — FENTANYL CITRATE (PF) 100 MCG/2ML IJ SOLN
INTRAMUSCULAR | Status: AC
  Filled 2016-07-13: qty 4

## 2016-07-13 MED ORDER — CHLORHEXIDINE GLUCONATE CLOTH 2 % EX PADS: 6.0000 | MEDICATED_PAD | Freq: Once | CUTANEOUS | Status: DC

## 2016-07-13 MED ORDER — BUPIVACAINE-EPINEPHRINE (PF) 0.5% -1:200000 IJ SOLN
INTRAMUSCULAR | Status: DC | PRN
  Administered 2016-07-13: 10 mL via PERINEURAL

## 2016-07-13 MED ORDER — CEFAZOLIN SODIUM-DEXTROSE 2-4 GM/100ML-% IV SOLN
INTRAVENOUS | Status: AC
  Filled 2016-07-13: qty 100

## 2016-07-13 MED ORDER — ALBUTEROL SULFATE (2.5 MG/3ML) 0.083% IN NEBU: 2.5000 mg | INHALATION_SOLUTION | Freq: Four times a day (QID) | RESPIRATORY_TRACT | Status: DC | PRN

## 2016-07-13 MED ORDER — HYDROCODONE-ACETAMINOPHEN 7.5-325 MG PO TABS: 1.0000 | ORAL_TABLET | Freq: Once | ORAL | Status: DC | PRN

## 2016-07-13 MED ORDER — PROMETHAZINE HCL 25 MG PO TABS: 12.5000 mg | ORAL_TABLET | Freq: Four times a day (QID) | ORAL | Status: DC | PRN

## 2016-07-13 MED ORDER — SCOPOLAMINE 1 MG/3DAYS TD PT72
1.0000 | MEDICATED_PATCH | TRANSDERMAL | Status: DC
  Administered 2016-07-13: 1 via TRANSDERMAL
  Filled 2016-07-13: qty 1

## 2016-07-13 MED ORDER — MIDAZOLAM HCL 5 MG/5ML IJ SOLN
INTRAMUSCULAR | Status: DC | PRN
  Administered 2016-07-13: 2 mg via INTRAVENOUS

## 2016-07-13 MED ORDER — OXYCODONE-ACETAMINOPHEN 10-325 MG PO TABS
1.0000 | ORAL_TABLET | ORAL | Status: DC | PRN
Start: 2016-07-13 — End: 2016-07-13

## 2016-07-13 MED ORDER — SUGAMMADEX SODIUM 200 MG/2ML IV SOLN
INTRAVENOUS | Status: AC
  Filled 2016-07-13: qty 2

## 2016-07-13 MED ORDER — THROMBIN 5000 UNITS EX SOLR
CUTANEOUS | Status: AC
  Filled 2016-07-13: qty 10000

## 2016-07-13 MED ORDER — HEMOSTATIC AGENTS (NO CHARGE) OPTIME
TOPICAL | Status: DC | PRN
  Administered 2016-07-13: 1 via TOPICAL

## 2016-07-13 MED ORDER — ONDANSETRON HCL 4 MG/2ML IJ SOLN
INTRAMUSCULAR | Status: AC
  Filled 2016-07-13: qty 2

## 2016-07-13 MED ORDER — MORPHINE SULFATE (PF) 2 MG/ML IV SOLN
1.0000 mg | INTRAVENOUS | Status: DC | PRN
Start: 2016-07-13 — End: 2016-07-14

## 2016-07-13 MED ORDER — MIDAZOLAM HCL 2 MG/2ML IJ SOLN
INTRAMUSCULAR | Status: AC
  Filled 2016-07-13: qty 2

## 2016-07-13 MED ORDER — BUPIVACAINE-EPINEPHRINE (PF) 0.5% -1:200000 IJ SOLN
INTRAMUSCULAR | Status: AC
  Filled 2016-07-13: qty 30

## 2016-07-13 MED ORDER — ROCURONIUM BROMIDE 10 MG/ML (PF) SYRINGE
PREFILLED_SYRINGE | INTRAVENOUS | Status: AC
  Filled 2016-07-13: qty 10

## 2016-07-13 MED ORDER — DEXAMETHASONE SODIUM PHOSPHATE 10 MG/ML IJ SOLN
INTRAMUSCULAR | Status: AC
  Filled 2016-07-13: qty 1

## 2016-07-13 MED ORDER — SUGAMMADEX SODIUM 200 MG/2ML IV SOLN
INTRAVENOUS | Status: DC | PRN
  Administered 2016-07-13: 150 mg via INTRAVENOUS

## 2016-07-13 MED ORDER — CEFAZOLIN SODIUM-DEXTROSE 2-4 GM/100ML-% IV SOLN
2.0000 g | Freq: Three times a day (TID) | INTRAVENOUS | Status: AC
  Administered 2016-07-13 (×2): 2 g via INTRAVENOUS
  Filled 2016-07-13 (×2): qty 100

## 2016-07-13 MED ORDER — ONDANSETRON HCL 4 MG/2ML IJ SOLN
INTRAMUSCULAR | Status: DC | PRN
  Administered 2016-07-13: 100 mg via INTRAVENOUS

## 2016-07-13 MED ORDER — CEFAZOLIN SODIUM-DEXTROSE 2-3 GM-% IV SOLR
2.0000 g | Freq: Once | INTRAVENOUS | Status: AC
  Administered 2016-07-13: 2 g via INTRAVENOUS

## 2016-07-13 MED ORDER — HYDROMORPHONE HCL 1 MG/ML IJ SOLN
0.2500 mg | INTRAMUSCULAR | Status: DC | PRN
  Filled 2016-07-13: qty 0.5

## 2016-07-13 MED ORDER — FUROSEMIDE 20 MG PO TABS
20.0000 mg | ORAL_TABLET | Freq: Every day | ORAL | Status: DC
  Filled 2016-07-13 (×2): qty 1

## 2016-07-13 MED ORDER — BACITRACIN ZINC 500 UNIT/GM EX OINT
TOPICAL_OINTMENT | CUTANEOUS | Status: DC | PRN
  Administered 2016-07-13: 1 via TOPICAL

## 2016-07-13 MED ORDER — ESTROGENS CONJUGATED 0.625 MG PO TABS
0.6250 mg | ORAL_TABLET | Freq: Every day | ORAL | Status: DC
  Administered 2016-07-14: 0.625 mg via ORAL
  Filled 2016-07-13: qty 1

## 2016-07-13 MED ORDER — ALUM & MAG HYDROXIDE-SIMETH 200-200-20 MG/5ML PO SUSP: 30.0000 mL | Freq: Four times a day (QID) | ORAL | Status: DC | PRN

## 2016-07-13 MED ORDER — BACITRACIN ZINC 500 UNIT/GM EX OINT
TOPICAL_OINTMENT | CUTANEOUS | Status: AC
  Filled 2016-07-13: qty 28.35

## 2016-07-13 MED ORDER — LIDOCAINE 2% (20 MG/ML) 5 ML SYRINGE
INTRAMUSCULAR | Status: AC
  Filled 2016-07-13: qty 5

## 2016-07-13 MED ORDER — HYDROCODONE-ACETAMINOPHEN 5-325 MG PO TABS
1.0000 | ORAL_TABLET | ORAL | Status: DC | PRN
  Administered 2016-07-14: 1 via ORAL
  Filled 2016-07-13: qty 2

## 2016-07-13 MED ORDER — HYDROMORPHONE HCL 1 MG/ML IJ SOLN
0.2500 mg | INTRAMUSCULAR | Status: DC | PRN
  Administered 2016-07-13: 0.5 mg via INTRAVENOUS

## 2016-07-13 MED ORDER — FENTANYL CITRATE (PF) 100 MCG/2ML IJ SOLN
INTRAMUSCULAR | Status: DC | PRN
  Administered 2016-07-13: 100 ug via INTRAVENOUS
  Administered 2016-07-13 (×2): 50 ug via INTRAVENOUS

## 2016-07-13 MED ORDER — ACETAMINOPHEN 650 MG RE SUPP: 650.0000 mg | RECTAL | Status: DC | PRN

## 2016-07-13 MED ORDER — METOCLOPRAMIDE HCL 5 MG/ML IJ SOLN
INTRAMUSCULAR | Status: AC
  Filled 2016-07-13: qty 2

## 2016-07-13 MED ORDER — MEPERIDINE HCL 25 MG/ML IJ SOLN: 6.2500 mg | INTRAMUSCULAR | Status: DC | PRN

## 2016-07-13 MED ORDER — OXYCODONE-ACETAMINOPHEN 5-325 MG PO TABS
1.0000 | ORAL_TABLET | ORAL | Status: DC | PRN
  Administered 2016-07-13: 2 via ORAL
  Administered 2016-07-13: 1 via ORAL
  Administered 2016-07-13: 2 via ORAL
  Administered 2016-07-14 (×2): 1 via ORAL
  Filled 2016-07-13 (×2): qty 2
  Filled 2016-07-13: qty 1
  Filled 2016-07-13: qty 2

## 2016-07-13 MED ORDER — BUPIVACAINE HCL (PF) 0.5 % IJ SOLN
INTRAMUSCULAR | Status: AC
  Filled 2016-07-13: qty 30

## 2016-07-13 MED ORDER — LACTATED RINGERS IV SOLN
INTRAVENOUS | Status: DC | PRN
  Administered 2016-07-13: 07:00:00 via INTRAVENOUS

## 2016-07-13 MED ORDER — SUCCINYLCHOLINE CHLORIDE 20 MG/ML IJ SOLN
INTRAMUSCULAR | Status: DC | PRN
  Administered 2016-07-13: 100 mg via INTRAVENOUS

## 2016-07-13 MED ORDER — CEFAZOLIN SODIUM-DEXTROSE 2-4 GM/100ML-% IV SOLN: 2.0000 g | INTRAVENOUS | Status: DC

## 2016-07-13 MED ORDER — THROMBIN 5000 UNITS EX SOLR
CUTANEOUS | Status: DC | PRN
  Administered 2016-07-13 (×2): 5000 [IU] via TOPICAL

## 2016-07-13 MED ORDER — 0.9 % SODIUM CHLORIDE (POUR BTL) OPTIME
TOPICAL | Status: DC | PRN
  Administered 2016-07-13: 1000 mL

## 2016-07-13 MED ORDER — PROPOFOL 10 MG/ML IV BOLUS
INTRAVENOUS | Status: DC | PRN
  Administered 2016-07-13: 50 mg via INTRAVENOUS
  Administered 2016-07-13: 150 mg via INTRAVENOUS

## 2016-07-13 MED ORDER — SUCCINYLCHOLINE CHLORIDE 200 MG/10ML IV SOSY
PREFILLED_SYRINGE | INTRAVENOUS | Status: AC
  Filled 2016-07-13: qty 10

## 2016-07-13 MED ORDER — ONDANSETRON HCL 4 MG/2ML IJ SOLN: 4.0000 mg | INTRAMUSCULAR | Status: DC | PRN

## 2016-07-13 MED ORDER — LUBIPROSTONE 24 MCG PO CAPS
24.0000 ug | ORAL_CAPSULE | Freq: Two times a day (BID) | ORAL | Status: DC
  Administered 2016-07-13 – 2016-07-14 (×2): 24 ug via ORAL
  Filled 2016-07-13 (×2): qty 1

## 2016-07-13 MED ORDER — LIDOCAINE HCL (CARDIAC) 20 MG/ML IV SOLN
INTRAVENOUS | Status: DC | PRN
  Administered 2016-07-13: 60 mg via INTRAVENOUS

## 2016-07-13 MED ORDER — MENTHOL 3 MG MT LOZG: 1.0000 | LOZENGE | OROMUCOSAL | Status: DC | PRN

## 2016-07-13 MED ORDER — HYDROMORPHONE HCL 2 MG/ML IJ SOLN: 0.2500 mg | INTRAMUSCULAR | Status: DC | PRN

## 2016-07-13 MED ORDER — ACETAMINOPHEN 325 MG PO TABS: 650.0000 mg | ORAL_TABLET | ORAL | Status: DC | PRN

## 2016-07-13 MED ORDER — HYDROMORPHONE HCL 2 MG/ML IJ SOLN
INTRAMUSCULAR | Status: AC
  Administered 2016-07-13: 0.5 mg
  Filled 2016-07-13: qty 1

## 2016-07-13 MED ORDER — METOCLOPRAMIDE HCL 5 MG/ML IJ SOLN
10.0000 mg | Freq: Once | INTRAMUSCULAR | Status: AC | PRN
  Administered 2016-07-13: 10 mg via INTRAVENOUS

## 2016-07-13 MED ORDER — PHENOL 1.4 % MT LIQD: 1.0000 | OROMUCOSAL | Status: DC | PRN

## 2016-07-13 SURGICAL SUPPLY — 47 items
APL SKNCLS STERI-STRIP NONHPOA (GAUZE/BANDAGES/DRESSINGS) ×1
BAG DECANTER FOR FLEXI CONT (MISCELLANEOUS) ×3 IMPLANT
BENZOIN TINCTURE PRP APPL 2/3 (GAUZE/BANDAGES/DRESSINGS) ×3 IMPLANT
BLADE CLIPPER SURG (BLADE) IMPLANT
BUR MATCHSTICK NEURO 3.0 LAGG (BURR) ×3 IMPLANT
BUR PRECISION FLUTE 6.0 (BURR) ×3 IMPLANT
CANISTER SUCT 3000ML PPV (MISCELLANEOUS) ×3 IMPLANT
CLOSURE WOUND 1/2 X4 (GAUZE/BANDAGES/DRESSINGS) ×1
DRAPE LAPAROTOMY 100X72X124 (DRAPES) ×3 IMPLANT
DRAPE MICROSCOPE LEICA (MISCELLANEOUS) ×3 IMPLANT
DRAPE POUCH INSTRU U-SHP 10X18 (DRAPES) ×3 IMPLANT
DRAPE SURG 17X23 STRL (DRAPES) ×12 IMPLANT
ELECT BLADE 4.0 EZ CLEAN MEGAD (MISCELLANEOUS) ×3
ELECT REM PT RETURN 9FT ADLT (ELECTROSURGICAL) ×3
ELECTRODE BLDE 4.0 EZ CLN MEGD (MISCELLANEOUS) ×1 IMPLANT
ELECTRODE REM PT RTRN 9FT ADLT (ELECTROSURGICAL) ×1 IMPLANT
GAUZE SPONGE 4X4 12PLY STRL (GAUZE/BANDAGES/DRESSINGS) ×3 IMPLANT
GAUZE SPONGE 4X4 16PLY XRAY LF (GAUZE/BANDAGES/DRESSINGS) IMPLANT
GLOVE BIO SURGEON STRL SZ8 (GLOVE) ×3 IMPLANT
GLOVE BIO SURGEON STRL SZ8.5 (GLOVE) ×3 IMPLANT
GLOVE EXAM NITRILE LRG STRL (GLOVE) IMPLANT
GLOVE EXAM NITRILE XL STR (GLOVE) IMPLANT
GLOVE EXAM NITRILE XS STR PU (GLOVE) IMPLANT
GOWN STRL REUS W/ TWL LRG LVL3 (GOWN DISPOSABLE) IMPLANT
GOWN STRL REUS W/ TWL XL LVL3 (GOWN DISPOSABLE) ×1 IMPLANT
GOWN STRL REUS W/TWL 2XL LVL3 (GOWN DISPOSABLE) IMPLANT
GOWN STRL REUS W/TWL LRG LVL3 (GOWN DISPOSABLE)
GOWN STRL REUS W/TWL XL LVL3 (GOWN DISPOSABLE) ×3
KIT BASIN OR (CUSTOM PROCEDURE TRAY) ×3 IMPLANT
KIT ROOM TURNOVER OR (KITS) ×3 IMPLANT
NDL HYPO 21X1.5 SAFETY (NEEDLE) IMPLANT
NEEDLE HYPO 21X1.5 SAFETY (NEEDLE) IMPLANT
NEEDLE HYPO 22GX1.5 SAFETY (NEEDLE) ×3 IMPLANT
NS IRRIG 1000ML POUR BTL (IV SOLUTION) ×3 IMPLANT
PACK LAMINECTOMY NEURO (CUSTOM PROCEDURE TRAY) ×3 IMPLANT
PAD ARMBOARD 7.5X6 YLW CONV (MISCELLANEOUS) ×9 IMPLANT
PATTIES SURGICAL .5 X1 (DISPOSABLE) IMPLANT
RUBBERBAND STERILE (MISCELLANEOUS) ×6 IMPLANT
SPONGE SURGIFOAM ABS GEL SZ50 (HEMOSTASIS) ×3 IMPLANT
STRIP CLOSURE SKIN 1/2X4 (GAUZE/BANDAGES/DRESSINGS) ×2 IMPLANT
SUT VIC AB 1 CT1 18XBRD ANBCTR (SUTURE) ×1 IMPLANT
SUT VIC AB 1 CT1 8-18 (SUTURE) ×3
SUT VIC AB 2-0 CP2 18 (SUTURE) ×3 IMPLANT
TAPE CLOTH SURG 4X10 WHT LF (GAUZE/BANDAGES/DRESSINGS) ×3 IMPLANT
TOWEL OR 17X24 6PK STRL BLUE (TOWEL DISPOSABLE) ×3 IMPLANT
TOWEL OR 17X26 10 PK STRL BLUE (TOWEL DISPOSABLE) ×3 IMPLANT
WATER STERILE IRR 1000ML POUR (IV SOLUTION) ×3 IMPLANT

## 2016-07-13 NOTE — H&P (Signed)
Subjective: The patient is a 60 year old white female who has complained of back, left buttock and leg pain. She has failed medical management and was worked up with a lumbar MRI. This demonstrated a herniated disc at L4-5. I discussed the various treatment options. She has decided proceed with surgery.   Past Medical History:  Diagnosis Date  . Anxiety   . Arthritis   . Asthma   . Chronic pain syndrome   . COPD (chronic obstructive pulmonary disease) ([place])   . Diverticulitis   . Dyspnea   . GERD (gastroesophageal reflux disease)   . Heart murmur    As child  . IBS (irritable bowel syndrome)   . Spinal headache     Past Surgical History:  Procedure Laterality Date  . ABDOMINAL HYSTERECTOMY    . BACK SURGERY     x3 previous lumbar, x3 cervical surgeries  . CHOLECYSTECTOMY    . COLONOSCOPY  05/20/10   [id number] hemorrhoids otherwise normal/EGD:distal esophageal erosions otherwise normal  . CYST EXCISION Right    Eye cyst  . EYE SURGERY Right    removed a cyst  . menicus repair  2007   L knee   . POSTERIOR CERVICAL FUSION/FORAMINOTOMY N/A 04/10/2013   Procedure: POSTERIOR CERVICAL FUSION/FORAMINOTOMY LEVEL 2;  Surgeon: Sinclair Ship, MD;  Location: [facility];  Service: Orthopedics;  Laterality: N/A;  Posterior cervical decompression fusion, cervical 5-6, cervical 6-7 with instrumentation and allograft.  . TUBAL LIGATION      Allergies  Allergen Reactions  . Clindamycin Hcl Anaphylaxis  . Codeine Nausea Only    Social History  Substance Use Topics  . Smoking status: Former Smoker    Types: Cigarettes    Quit date: 08/25/2007  . Smokeless tobacco: Never Used  . Alcohol use No    Family History  Problem Relation Age of Onset  . Hypertension Mother   . Emphysema Mother   . Cancer Father   . Hypertension Other   . Diabetes Other   . Colon cancer Neg Hx   . Colon polyps Neg Hx    Prior to Admission medications   Medication Sig Start Date End Date Taking?  Authorizing Provider  albuterol (PROVENTIL HFA;VENTOLIN HFA) 108 (90 Base) MCG/ACT inhaler Inhale into the lungs every 6 (six) hours as needed for wheezing or shortness of breath.   Yes Historical Provider, MD  ALPRAZolam Duanne Moron) 1 MG tablet Take 1 mg by mouth 4 (four) times daily as needed for anxiety.    Yes Historical Provider, MD  cyclobenzaprine (FLEXERIL) 10 MG tablet Take 10 mg by mouth 3 (three) times daily as needed for muscle spasms.  06/09/16  Yes Historical Provider, MD  estrogens, conjugated, (PREMARIN) 0.625 MG tablet Take 0.625 mg by mouth daily with breakfast.    Yes Historical Provider, MD  furosemide (LASIX) 20 MG tablet Take 20 mg by mouth daily.   Yes Historical Provider, MD  LINZESS 290 MCG CAPS capsule Take 290 mcg by mouth daily. 06/04/14  Yes Historical Provider, MD  oxyCODONE-acetaminophen (PERCOCET) 10-325 MG per tablet Take 1 tablet by mouth every 4 (four) hours as needed for pain.  05/27/14  Yes Historical Provider, MD  lubiprostone (AMITIZA) 24 MCG capsule Take 1 capsule (24 mcg total) by mouth 2 (two) times daily with a meal. 02/02/15   Carlis Stable, NP  promethazine (PHENERGAN) 12.5 MG tablet Take 1 tablet (12.5 mg total) by mouth every 6 (six) hours as needed for nausea. 04/11/13   Lennie Muckle  McKenzie, PA-C     Review of Systems  Positive ROS: As above  All other systems have been reviewed and were otherwise negative with the exception of those mentioned in the HPI and as above.  Objective: Vital signs in last 24 hours: Temp:  [97.8 F (36.6 C)] 97.8 F (36.6 C) (11/02 0559) Pulse Rate:  [60] 60 (11/02 0559) Resp:  [20] 20 (11/02 0559) BP: (121)/(74) 121/74 (11/02 0559) SpO2:  [99 %] 99 % (11/02 0559) Weight:  [66.7 kg (147 lb)] 66.7 kg (147 lb) (11/02 0559)  General Appearance: Alert, cooperative, no distress, Head: Normocephalic, without obvious abnormality, atraumatic Eyes: PERRL, conjunctiva/corneas clear, EOM's intact,    Ears: Normal  Throat: Normal   Neck: Supple, symmetrical, trachea midline, no adenopathy; thyroid: No enlargement/tenderness/nodules; no carotid bruit or JVD Back: Symmetric, no curvature, ROM normal, no CVA tenderness Lungs: Clear to auscultation bilaterally, respirations unlabored Heart: Regular rate and rhythm, no murmur, rub or gallop Abdomen: Soft, non-tender,, no masses, no organomegaly Extremities: Extremities normal, atraumatic, no cyanosis or edema Pulses: 2+ and symmetric all extremities Skin: Skin color, texture, turgor normal, no rashes or lesions  NEUROLOGIC:   Mental status: alert and oriented, no aphasia, good attention span, Fund of knowledge/ memory ok Motor Exam - grossly normal Sensory Exam - grossly normal Reflexes:  Coordination - grossly normal Gait - grossly normal Balance - grossly normal Cranial Nerves: I: smell Not tested  II: visual acuity  OS: Normal  OD: Normal   II: visual fields Full to confrontation  II: pupils Equal, round, reactive to light  III,VII: ptosis None  III,IV,VI: extraocular muscles  Full ROM  V: mastication Normal  V: facial light touch sensation  Normal  V,VII: corneal reflex  Present  VII: facial muscle function - upper  Normal  VII: facial muscle function - lower Normal  VIII: hearing Not tested  IX: soft palate elevation  Normal  IX,X: gag reflex Present  XI: trapezius strength  5/5  XI: sternocleidomastoid strength 5/5  XI: neck flexion strength  5/5  XII: tongue strength  Normal    Data Review Lab Results  Component Value Date   WBC 6.8 07/05/2016   HGB 13.7 07/05/2016   HCT 41.1 07/05/2016   MCV 92.6 07/05/2016   PLT 300 07/05/2016   Lab Results  Component Value Date   NA 139 07/05/2016   K 3.9 07/05/2016   CL 105 07/05/2016   CO2 26 07/05/2016   BUN 9 07/05/2016   CREATININE 0.68 07/05/2016   GLUCOSE 73 07/05/2016   Lab Results  Component Value Date   INR 0.95 04/04/2013    Assessment/Plan: Left L4-5 herniated disc, lumbago,  lumbar radiculopathy: I have discussed the situation with the patient. I have reviewed her imaging studies with her. We have discussed the various treatment options including surgery. I have described the surgical treatment option of a left L4-5 discectomy. I have shown her surgical models. We have discussed the risks, benefits, alternatives, expected postoperative course, and likelihood of achieving her goals with surgery. I have answered all patient's questions. She has decided to proceed with surgery.   Bay Wayson D 07/13/2016 7:25 AM

## 2016-07-13 NOTE — Transfer of Care (Signed)
Immediate Anesthesia Transfer of Care Note  Patient: Gina Wolfe  Procedure(s) Performed: Procedure(s): DISCECTOMY Lumbar four-five (N/A)  Patient Location: PACU  Anesthesia Type:General  Level of Consciousness: awake, alert , oriented and patient cooperative  Airway & Oxygen Therapy: Patient Spontanous Breathing and Patient connected to nasal cannula oxygen  Post-op Assessment: Report given to RN and Post -op Vital signs reviewed and stable  Post vital signs: Reviewed  Last Vitals:  Vitals:   07/13/16 0559  BP: 121/74  Pulse: 60  Resp: 20  Temp: 36.6 C    Last Pain:  Vitals:   07/13/16 0623  TempSrc:   PainSc: 7          Complications: No apparent anesthesia complications

## 2016-07-13 NOTE — Evaluation (Signed)
Physical Therapy Evaluation Patient Details Name: Gina Wolfe MRN: [id number] DOB: [birth year] Today's Date: 07/13/2016   History of Present Illness  Patient is a 60 y/o female with hx of COPD, anxiety, chronic pain syndrome and multiple back surgeries presents s/p L4-5 DISCECTOMY.  Clinical Impression  Patient presents with lethargy, pain and post surgical deficits s/p above surgery. Tolerated gait training with Min A for balance/safety. Pt with + lightheadedness initially with mobility. Pt has support from spouse at home. Education re: back precautions, positioning, body mechanics etc. Will follow acutely to maximize independence and mobility prior to return home. Will plan for stair training tomorrow as tolerated.     Follow Up Recommendations No PT follow up;Supervision for mobility/OOB    Equipment Recommendations  None recommended by PT    Recommendations for Other Services       Precautions / Restrictions Precautions Precautions: Back Precaution Booklet Issued: No Precaution Comments: Reviewed back precautions Restrictions Weight Bearing Restrictions: No      Mobility  Bed Mobility Overal bed mobility: Needs Assistance Bed Mobility: Rolling;Sidelying to Sit;Sit to Sidelying Rolling: Modified independent (Device/Increase time) Sidelying to sit: Modified independent (Device/Increase time)     Sit to sidelying: Modified independent (Device/Increase time) General bed mobility comments: HOB flat, no use of rails to simulate home. Cues for log roll technique.  Transfers Overall transfer level: Needs assistance Equipment used: None Transfers: Sit to/from Stand Sit to Stand: Supervision         General transfer comment: Supervision for safety. Soft BP 97/58.  Ambulation/Gait Ambulation/Gait assistance: Min assist Ambulation Distance (Feet): 120 Feet Assistive device: 1 person hand held assist Gait Pattern/deviations: Step-through pattern;Decreased stride  length Gait velocity: decreased   General Gait Details: Slow, mildly unsteady gait due to lethargy and pain.   Stairs            Wheelchair Mobility    Modified Rankin (Stroke Patients Only)       Balance Overall balance assessment: Needs assistance Sitting-balance support: Feet supported;No upper extremity supported Sitting balance-Leahy Scale: Good     Standing balance support: During functional activity Standing balance-Leahy Scale: Fair                               Pertinent Vitals/Pain Pain Assessment: Faces Faces Pain Scale: Hurts little more Pain Location: back Pain Descriptors / Indicators: Operative site guarding;Sore Pain Intervention(s): Monitored during session;Repositioned;Premedicated before session    [place] expects to be discharged to:: Private residence Living Arrangements: Spouse/significant other Available Help at Discharge: Family Type of Home: House Home Access: Stairs to enter Entrance Stairs-Rails: Can reach both Entrance Stairs-Number of Steps: 4 Home Layout: One level Home Equipment: None      Prior Function Level of Independence: Needs assistance      ADL's / Homemaking Assistance Needed: Assist with ADLs from her spouse        Hand Dominance   Dominant Hand: Right    Extremity/Trunk Assessment   Upper Extremity Assessment: Defer to OT evaluation           Lower Extremity Assessment: Generalized weakness         Communication      Cognition Arousal/Alertness: Lethargic;Suspect due to medications Behavior During Therapy: [facility] for tasks assessed/performed Overall Cognitive Status: Within Functional Limits for tasks assessed  General Comments      Exercises     Assessment/Plan    PT Assessment Patient needs continued PT services  PT Problem List Decreased strength;Decreased mobility;Decreased balance;Decreased cognition;Pain          PT  Treatment Interventions DME instruction;Therapeutic activities;Gait training;Therapeutic exercise;Patient/family education;Balance training;Stair training;Functional mobility training    PT Goals (Current goals can be found in the Care Plan section)  Acute Rehab PT Goals Patient Stated Goal: to be able to chase my 60 y/o around PT Goal Formulation: With patient Time For Goal Achievement: 07/27/16 Potential to Achieve Goals: Good    Frequency Min 5X/week   Barriers to discharge Inaccessible home environment stairs to enter home    Co-evaluation               End of Session Equipment Utilized During Treatment: Gait belt Activity Tolerance: Patient tolerated treatment well Patient left: in bed;with call bell/phone within reach;with family/visitor present Nurse Communication: Mobility status    Functional Assessment Tool Used: clinical judgment Functional Limitation: Mobility: Walking and moving around Mobility: Walking and Moving Around Current Status [phone]): At least 20 percent but less than 40 percent impaired, limited or restricted Mobility: Walking and Moving Around Goal Status [phone]): At least 1 percent but less than 20 percent impaired, limited or restricted    Time: 1550-1605 PT Time Calculation (min) (ACUTE ONLY): 15 min   Charges:   PT Evaluation $PT Eval Low Complexity: 1 Procedure     PT G Codes:   PT G-Codes **NOT FOR INPATIENT CLASS** Functional Assessment Tool Used: clinical judgment Functional Limitation: Mobility: Walking and moving around Mobility: Walking and Moving Around Current Status [id number]): At least 20 percent but less than 40 percent impaired, limited or restricted Mobility: Walking and Moving Around Goal Status [phone]): At least 1 percent but less than 20 percent impaired, limited or restricted    [place] 07/13/2016, 4:11 PM  Wray Kearns, [place], DPT [phone]

## 2016-07-13 NOTE — Anesthesia Postprocedure Evaluation (Signed)
Anesthesia Post Note  Patient: Ashlynn Flanery Blacklock  Procedure(s) Performed: Procedure(s) (LRB): DISCECTOMY Lumbar four-five (N/A)  Patient location during evaluation: PACU Anesthesia Type: General Level of consciousness: awake and alert and oriented Pain management: pain level controlled Vital Signs Assessment: post-procedure vital signs reviewed and stable Respiratory status: spontaneous breathing, nonlabored ventilation and respiratory function stable Cardiovascular status: blood pressure returned to baseline and stable Postop Assessment: no signs of nausea or vomiting Anesthetic complications: no    Last Vitals:  Vitals:   07/13/16 1000 07/13/16 1015  BP: 103/82 118/72  Pulse: 66 69  Resp: 14 (!) 9  Temp:      Last Pain:  Vitals:   07/13/16 1015  TempSrc:   PainSc: 4                  Addilynn Mowrer A.

## 2016-07-13 NOTE — Progress Notes (Signed)
Patient ID: Gina Wolfe, female   DOB: 1955/12/01, 60 y.o.   MRN: [id number] Subjective:  The patient is alert and pleasant. She looks well. She is pleased that her leg pain has improved with surgery. She is having some nausea. She wants to stay until tomorrow.  Objective: Vital signs in last 24 hours: Temp:  [97.5 F (36.4 C)-97.9 F (36.6 C)] 97.8 F (36.6 C) (11/02 1500) Pulse Rate:  [60-95] 78 (11/02 1500) Resp:  [9-20] 18 (11/02 1500) BP: (97-138)/(57-85) 97/58 (11/02 1500) SpO2:  [90 %-100 %] 98 % (11/02 1500) Weight:  [66.7 kg (147 lb)] 66.7 kg (147 lb) (11/02 0559)  Intake/Output from previous day: No intake/output data recorded. Intake/Output this shift: Total I/O In: 1360 [P.O.:460; I.V.:800; IV Piggyback:100] Out: 25 [Blood:25]  Physical exam the patient is alert and pleasant. She is moving her lower extremities well.  Lab Results: No results for input(s): WBC, HGB, HCT, PLT in the last 72 hours. BMET No results for input(s): NA, K, CL, CO2, GLUCOSE, BUN, CREATININE, CALCIUM in the last 72 hours.  Studies/Results: Dg Lumbar Spine 1 View  Result Date: 07/13/2016 CLINICAL DATA:  Localization pain for discectomy EXAM: LUMBAR SPINE - 1 VIEW COMPARISON:  Lumbar spine MRI 12/10/2015 FINDINGS: Single lateral view of the lumbar spine submitted. There is disc space flattening at L5-S1 level. There is a posterior metallic localization instrument at the level of L4-L5 disc space. IMPRESSION: Posterior localization metallic instrument at [id number] disc space level. Electronically Signed   By: Lahoma Crocker M.D.   On: 07/13/2016 10:01    Assessment/Plan: The patient is doing well. She will likely go home tomorrow.  LOS: 0 days     Win Guajardo D 07/13/2016, 5:45 PM

## 2016-07-13 NOTE — Op Note (Signed)
Brief history: The patient is a 60 year old white female who has had chronic left leg pain. She had a discectomy many years ago. More recently she's developed right leg pain. She was worked up with a lumbar MRI which demonstrated a herniated disc at L4-5. I discussed the various treatment options. The patient has decided to proceed with surgery after weighing the risks, benefits, and alternatives to surgery.  Preoperative diagnosis: Right L4-5 herniated disks, lumbago, lumbar radiculopathy  Postoperative diagnosis: The same  Procedure: Right L4-5 Intervertebral discectomy using micro-dissection  Surgeon: Dr. Earle Gell  Asst.: None  Anesthesia: Gen. endotracheal  Estimated blood loss: Minimal  Drains: None  Complications: None  Description of procedure: The patient was brought to the operating room by the anesthesia team. General endotracheal anesthesia was induced. The patient was turned to the prone position on the Wilson frame. The patient's lumbosacral region was then prepared with Betadine scrub and Betadine solution. Sterile drapes were applied.  I then injected the area to be incised with Marcaine with epinephrine solution. I then used a scalpel to make a linear midline incision over the L4-5 intervertebral disc space. I then used electrocautery to perform a right sided subperiosteal dissection exposing the spinous process and lamina of L4 and L5. We obtained intraoperative radiograph to confirm our location. I then inserted the [facility] retractor for exposure.  We then brought the operative microscope into the field. Under its magnification and illumination we completed the microdissection. I used a high-speed drill to perform a laminotomy at L4-5 on the right. I then used a Kerrison punches to widen the laminotomy and removed the ligamentum flavum at L4-5 on the right. We then used microdissection to free up the thecal sac and the right L5 nerve root from the epidural tissue. I  then used a Kerrison punch to perform a foraminotomy at about the right L5 nerve root. We then using the nerve root retractor to gently retract the thecal sac and the L5 nerve root medially. This exposed the intervertebral disc. We identified the ruptured disc and remove it with the pituitary forceps. We performed a partial intervertebral discectomy with pituitary forceps.  I then palpated along the ventral surface of the thecal sac and along exit route of the right L5 nerve root and noted that the neural structures were well decompressed. This completed the decompression.  We then obtained hemostasis using bipolar electrocautery. We irrigated the wound out with bacitracin solution. We then removed the retractor. We then reapproximated the patient's thoracolumbar fascia with interrupted #1 Vicryl suture. We then reapproximated the patient's subcutaneous tissue with interrupted 2-0 Vicryl suture. We then reapproximated patient's skin with Steri-Strips and benzoin. The was then coated with bacitracin ointment. The drapes were removed. The patient was subsequently returned to the supine position where they were extubated by the anesthesia team. The patient was then transported to the postanesthesia care unit in stable condition. All sponge instrument and needle counts were reportedly correct at the end of this case.

## 2016-07-13 NOTE — Progress Notes (Signed)
Partial plate returned to pt

## 2016-07-13 NOTE — Anesthesia Procedure Notes (Signed)
Procedure Name: Intubation Date/Time: 07/13/2016 7:45 AM Performed by: Jenne Campus Pre-anesthesia Checklist: Patient identified, Emergency Drugs available, Suction available and Patient being monitored Patient Re-evaluated:Patient Re-evaluated prior to inductionOxygen Delivery Method: Circle System Utilized Preoxygenation: Pre-oxygenation with 100% oxygen Intubation Type: IV induction Ventilation: Mask ventilation without difficulty Grade View: Grade I Tube type: Oral Tube size: 7.0 mm Number of attempts: 1 Airway Equipment and Method: Oral airway,  Video-laryngoscopy and Rigid stylet Placement Confirmation: ETT inserted through vocal cords under direct vision,  positive ETCO2 and breath sounds checked- equal and bilateral Secured at: 22 cm Tube secured with: Tape Dental Injury: Teeth and Oropharynx as per pre-operative assessment

## 2016-07-13 NOTE — Progress Notes (Signed)
Report gibven to [employer] as caregiver

## 2016-07-14 ENCOUNTER — Encounter (HOSPITAL_COMMUNITY): Payer: Self-pay | Admitting: Neurosurgery

## 2016-07-14 DIAGNOSIS — M5116 Intervertebral disc disorders with radiculopathy, lumbar region: Secondary | ICD-10-CM | POA: Diagnosis not present

## 2016-07-14 MED ORDER — CYCLOBENZAPRINE HCL 10 MG PO TABS: 10.0000 mg | ORAL_TABLET | Freq: Three times a day (TID) | ORAL | Status: DC | PRN

## 2016-07-14 MED ORDER — OXYCODONE-ACETAMINOPHEN 10-325 MG PO TABS: 1.0000 | ORAL_TABLET | ORAL | 0 refills | Status: DC | PRN

## 2016-07-14 NOTE — Progress Notes (Signed)
Pt doing well. Pt and husband given D/C instructions with Rx's, verbal understanding was provided. Pt's incision is covered with gauze dressing and has no sign of infection. Pt's IV was removed prior to D/C. Pt D/C'd home via wheelchair @ 1035 per MD order. Pt is stable @ D/C and has no other needs at this time. Holli Humbles, RN

## 2016-07-14 NOTE — Progress Notes (Signed)
Physical Therapy Treatment Patient Details Name: Gina Wolfe MRN: [id number] DOB: Dec 13, 1955 Today's Date: 07/14/2016    History of Present Illness Patient is a 60 y/o female with hx of COPD, anxiety, chronic pain syndrome and multiple back surgeries presents s/p L4-5 DISCECTOMY.    PT Comments    Pt progressing towards physical therapy goals. Was able to perform transfers and ambulation with modified independence to supervision for safety. Pt anticipates d/c home today.   Follow Up Recommendations  No PT follow up;Supervision for mobility/OOB     Equipment Recommendations  None recommended by PT    Recommendations for Other Services       Precautions / Restrictions Precautions Precautions: Back Precaution Booklet Issued: No Precaution Comments: Reviewed back precautions Restrictions Weight Bearing Restrictions: No    Mobility  Bed Mobility               General bed mobility comments: Pt sitting upright in bed to eat breakfast. Pt was cued to sit EOB for better back positioning.   Transfers Overall transfer level: Needs assistance Equipment used: None Transfers: Sit to/from Stand Sit to Stand: Modified independent (Device/Increase time)         General transfer comment: Supervision for safety. Soft BP 97/58.  Ambulation/Gait Ambulation/Gait assistance: Supervision Ambulation Distance (Feet): 400 Feet Assistive device: None Gait Pattern/deviations: Step-through pattern;Decreased stride length Gait velocity: decreased       Stairs            Wheelchair Mobility    Modified Rankin (Stroke Patients Only)       Balance Overall balance assessment: Needs assistance Sitting-balance support: Feet supported;No upper extremity supported Sitting balance-Leahy Scale: Good     Standing balance support: No upper extremity supported;During functional activity Standing balance-Leahy Scale: Fair                      Cognition  Arousal/Alertness: Awake/alert Behavior During Therapy: WFL for tasks assessed/performed Overall Cognitive Status: Within Functional Limits for tasks assessed                      Exercises      General Comments        Pertinent Vitals/Pain Pain Assessment: Faces Faces Pain Scale: Hurts a little bit Pain Location: Incision site Pain Descriptors / Indicators: Operative site guarding;Aching Pain Intervention(s): Limited activity within patient's tolerance;Monitored during session;Repositioned    Home Living Family/patient expects to be discharged to:: Private residence Living Arrangements: Spouse/significant other Available Help at Discharge: Family Type of Home: House Home Access: Stairs to enter Entrance Stairs-Rails: Can reach both Home Layout: One level Home Equipment: None      Prior Function Level of Independence: Needs assistance    ADL's / Homemaking Assistance Needed: Assist with ADLs from her spouse     PT Goals (current goals can now be found in the care plan section) Acute Rehab PT Goals Patient Stated Goal: to be able to chase my 60 y/o around PT Goal Formulation: With patient Time For Goal Achievement: 07/27/16 Potential to Achieve Goals: Good Progress towards PT goals: Progressing toward goals    Frequency    Min 5X/week      PT Plan Current plan remains appropriate    Co-evaluation             End of Session Equipment Utilized During Treatment: Gait belt Activity Tolerance: Patient tolerated treatment well Patient left: in bed;with call bell/phone within reach;with family/visitor present  Time: 0920-0928 PT Time Calculation (min) (ACUTE ONLY): 8 min  Charges:  $Gait Training: 8-22 mins                    G Codes:      Rolinda Roan Jul 26, 2016, 2:01 PM   Rolinda Roan, PT, DPT Acute Rehabilitation Services Pager: [phone]

## 2016-07-14 NOTE — Discharge Summary (Signed)
Physician Discharge Summary  Patient ID: Gina Wolfe MRN: [id number] DOB/AGE: 11-21-1955 60 y.o.  Admit date: 07/13/2016 Discharge date: 07/14/2016  Admission Diagnoses:L4-5 herniated disc, lumbago, lumbar radiculopathy  Discharge Diagnoses: The same Active Problems:   Lumbar herniated disc   Discharged Condition: good  Hospital Course: I performed a right L4-5 discectomy on the patient on 11-17. The surgery went well.  The patient's postoperative course was unremarkable. Her leg pain had improved significantly after surgery. On postoperative day #1 she requested discharge to home. She was given written and oral discharge instructions. All her questions were answered.  Consults: Physical therapy Significant Diagnostic Studies: None Treatments: Right L4-5 discectomy using microdissection Discharge Exam: Blood pressure (!) 95/57, pulse 62, temperature 98.3 F (36.8 C), temperature source Oral, resp. rate 18, weight 66.7 kg (147 lb), SpO2 100 %. The patient is alert and pleasant. Her strength is grossly normal in her lower extremities. She looks well.  Disposition: Home  Discharge Instructions    Call MD for:  difficulty breathing, headache or visual disturbances    Complete by:  As directed    Call MD for:  extreme fatigue    Complete by:  As directed    Call MD for:  hives    Complete by:  As directed    Call MD for:  persistant dizziness or light-headedness    Complete by:  As directed    Call MD for:  persistant nausea and vomiting    Complete by:  As directed    Call MD for:  redness, tenderness, or signs of infection (pain, swelling, redness, odor or green/yellow discharge around incision site)    Complete by:  As directed    Call MD for:  severe uncontrolled pain    Complete by:  As directed    Call MD for:  temperature >100.4    Complete by:  As directed    Diet - low sodium heart healthy    Complete by:  As directed    Discharge instructions    Complete by:  As  directed    Call [phone] for a followup appointment. Take a stool softener while you are using pain medications.   Driving Restrictions    Complete by:  As directed    Do not drive for 2 weeks.   Increase activity slowly    Complete by:  As directed    Lifting restrictions    Complete by:  As directed    Do not lift more than 5 pounds. No excessive bending or twisting.   May shower / Bathe    Complete by:  As directed    He may shower after the pain she is removed 3 days after surgery. Leave the incision alone.   Remove dressing in 48 hours    Complete by:  As directed    Your stitches are under the scan and will dissolve by themselves. The Steri-Strips will fall off after you take a few showers. Do not rub back or pick at the wound, Leave the wound alone.        SignedOphelia Charter 07/14/2016, 7:47 AM

## 2016-07-14 NOTE — Discharge Instructions (Signed)

## 2017-01-18 DIAGNOSIS — M542 Cervicalgia: Secondary | ICD-10-CM | POA: Diagnosis not present

## 2017-01-18 DIAGNOSIS — M5442 Lumbago with sciatica, left side: Secondary | ICD-10-CM | POA: Diagnosis not present

## 2017-01-18 DIAGNOSIS — M47816 Spondylosis without myelopathy or radiculopathy, lumbar region: Secondary | ICD-10-CM | POA: Diagnosis not present

## 2017-03-05 DIAGNOSIS — Z1389 Encounter for screening for other disorder: Secondary | ICD-10-CM | POA: Diagnosis not present

## 2017-03-05 DIAGNOSIS — Z0001 Encounter for general adult medical examination with abnormal findings: Secondary | ICD-10-CM | POA: Diagnosis not present

## 2017-03-05 DIAGNOSIS — E538 Deficiency of other specified B group vitamins: Secondary | ICD-10-CM | POA: Diagnosis not present

## 2017-03-05 DIAGNOSIS — M62838 Other muscle spasm: Secondary | ICD-10-CM | POA: Diagnosis not present

## 2017-03-05 DIAGNOSIS — Z6825 Body mass index (BMI) 25.0-25.9, adult: Secondary | ICD-10-CM | POA: Diagnosis not present

## 2017-03-22 ENCOUNTER — Other Ambulatory Visit (HOSPITAL_COMMUNITY): Payer: Self-pay | Admitting: Internal Medicine

## 2017-03-22 ENCOUNTER — Ambulatory Visit (HOSPITAL_COMMUNITY)
Admission: RE | Admit: 2017-03-22 | Discharge: 2017-03-22 | Disposition: A | Discharge status: Home / Self Care | Payer: Medicare HMO | Source: Ambulatory Visit | Attending: Internal Medicine | Admitting: Internal Medicine

## 2017-03-22 DIAGNOSIS — E663 Overweight: Secondary | ICD-10-CM | POA: Diagnosis not present

## 2017-03-22 DIAGNOSIS — R221 Localized swelling, mass and lump, neck: Secondary | ICD-10-CM | POA: Diagnosis not present

## 2017-03-22 DIAGNOSIS — G5762 Lesion of plantar nerve, left lower limb: Secondary | ICD-10-CM | POA: Diagnosis not present

## 2017-03-22 DIAGNOSIS — S9032XA Contusion of left foot, initial encounter: Secondary | ICD-10-CM | POA: Diagnosis not present

## 2017-03-22 DIAGNOSIS — M79672 Pain in left foot: Secondary | ICD-10-CM | POA: Diagnosis not present

## 2017-03-22 DIAGNOSIS — F1120 Opioid dependence, uncomplicated: Secondary | ICD-10-CM | POA: Diagnosis not present

## 2017-03-22 DIAGNOSIS — Z6825 Body mass index (BMI) 25.0-25.9, adult: Secondary | ICD-10-CM | POA: Diagnosis not present

## 2017-05-09 DIAGNOSIS — F419 Anxiety disorder, unspecified: Secondary | ICD-10-CM | POA: Diagnosis not present

## 2017-05-09 DIAGNOSIS — Z1389 Encounter for screening for other disorder: Secondary | ICD-10-CM | POA: Diagnosis not present

## 2017-05-09 DIAGNOSIS — M1991 Primary osteoarthritis, unspecified site: Secondary | ICD-10-CM | POA: Diagnosis not present

## 2017-05-09 DIAGNOSIS — M7121 Synovial cyst of popliteal space [Baker], right knee: Secondary | ICD-10-CM | POA: Diagnosis not present

## 2017-05-09 DIAGNOSIS — G894 Chronic pain syndrome: Secondary | ICD-10-CM | POA: Diagnosis not present

## 2017-05-09 DIAGNOSIS — E663 Overweight: Secondary | ICD-10-CM | POA: Diagnosis not present

## 2017-05-09 DIAGNOSIS — Z0001 Encounter for general adult medical examination with abnormal findings: Secondary | ICD-10-CM | POA: Diagnosis not present

## 2017-05-09 DIAGNOSIS — Z79891 Long term (current) use of opiate analgesic: Secondary | ICD-10-CM | POA: Diagnosis not present

## 2017-05-09 DIAGNOSIS — Z6825 Body mass index (BMI) 25.0-25.9, adult: Secondary | ICD-10-CM | POA: Diagnosis not present

## 2017-05-09 DIAGNOSIS — R58 Hemorrhage, not elsewhere classified: Secondary | ICD-10-CM | POA: Diagnosis not present

## 2017-05-24 DIAGNOSIS — M25561 Pain in right knee: Secondary | ICD-10-CM | POA: Diagnosis not present

## 2017-05-24 DIAGNOSIS — G8929 Other chronic pain: Secondary | ICD-10-CM | POA: Diagnosis not present

## 2017-08-21 DIAGNOSIS — Z1389 Encounter for screening for other disorder: Secondary | ICD-10-CM | POA: Diagnosis not present

## 2017-08-21 DIAGNOSIS — M545 Low back pain: Secondary | ICD-10-CM | POA: Diagnosis not present

## 2017-08-21 DIAGNOSIS — Z6824 Body mass index (BMI) 24.0-24.9, adult: Secondary | ICD-10-CM | POA: Diagnosis not present

## 2017-08-21 DIAGNOSIS — G64 Other disorders of peripheral nervous system: Secondary | ICD-10-CM | POA: Diagnosis not present

## 2017-08-21 DIAGNOSIS — G894 Chronic pain syndrome: Secondary | ICD-10-CM | POA: Diagnosis not present

## 2017-08-21 DIAGNOSIS — M7122 Synovial cyst of popliteal space [Baker], left knee: Secondary | ICD-10-CM | POA: Diagnosis not present

## 2017-10-04 DIAGNOSIS — M5412 Radiculopathy, cervical region: Secondary | ICD-10-CM | POA: Diagnosis not present

## 2017-10-04 DIAGNOSIS — F1120 Opioid dependence, uncomplicated: Secondary | ICD-10-CM | POA: Diagnosis not present

## 2017-10-04 DIAGNOSIS — Z6825 Body mass index (BMI) 25.0-25.9, adult: Secondary | ICD-10-CM | POA: Diagnosis not present

## 2017-10-04 DIAGNOSIS — M47812 Spondylosis without myelopathy or radiculopathy, cervical region: Secondary | ICD-10-CM | POA: Diagnosis not present

## 2017-10-05 DIAGNOSIS — M545 Low back pain: Secondary | ICD-10-CM | POA: Diagnosis not present

## 2017-10-05 DIAGNOSIS — M542 Cervicalgia: Secondary | ICD-10-CM | POA: Diagnosis not present

## 2017-10-05 DIAGNOSIS — M5412 Radiculopathy, cervical region: Secondary | ICD-10-CM | POA: Diagnosis not present

## 2017-10-05 DIAGNOSIS — Z6825 Body mass index (BMI) 25.0-25.9, adult: Secondary | ICD-10-CM | POA: Diagnosis not present

## 2017-10-11 ENCOUNTER — Other Ambulatory Visit: Payer: Self-pay | Admitting: Neurosurgery

## 2017-10-11 DIAGNOSIS — M5412 Radiculopathy, cervical region: Secondary | ICD-10-CM

## 2017-10-11 DIAGNOSIS — G8929 Other chronic pain: Secondary | ICD-10-CM

## 2017-10-11 DIAGNOSIS — M545 Low back pain: Secondary | ICD-10-CM

## 2017-10-25 ENCOUNTER — Ambulatory Visit
Admission: RE | Admit: 2017-10-25 | Discharge: 2017-10-25 | Disposition: A | Discharge status: Home / Self Care | Payer: Medicare HMO | Source: Ambulatory Visit | Attending: Neurosurgery | Admitting: Neurosurgery

## 2017-10-25 VITALS — BP 121/72 | HR 61

## 2017-10-25 DIAGNOSIS — M545 Low back pain: Secondary | ICD-10-CM

## 2017-10-25 DIAGNOSIS — M5412 Radiculopathy, cervical region: Secondary | ICD-10-CM

## 2017-10-25 DIAGNOSIS — M5126 Other intervertebral disc displacement, lumbar region: Secondary | ICD-10-CM | POA: Diagnosis not present

## 2017-10-25 DIAGNOSIS — G8929 Other chronic pain: Secondary | ICD-10-CM

## 2017-10-25 DIAGNOSIS — M50322 Other cervical disc degeneration at C5-C6 level: Secondary | ICD-10-CM | POA: Diagnosis not present

## 2017-10-25 MED ORDER — DIAZEPAM 5 MG PO TABS
10.0000 mg | ORAL_TABLET | Freq: Once | ORAL | Status: AC
  Administered 2017-10-25: 10 mg via ORAL

## 2017-10-25 MED ORDER — IOPAMIDOL (ISOVUE-M 300) INJECTION 61%
10.0000 mL | Freq: Once | INTRAMUSCULAR | Status: AC | PRN
  Administered 2017-10-25: 10 mL via INTRATHECAL

## 2017-10-25 MED ORDER — MEPERIDINE HCL 100 MG/ML IJ SOLN
100.0000 mg | Freq: Once | INTRAMUSCULAR | Status: AC
  Administered 2017-10-25: 100 mg via INTRAMUSCULAR

## 2017-10-25 MED ORDER — HYDROXYZINE HCL 50 MG/ML IM SOLN
25.0000 mg | Freq: Once | INTRAMUSCULAR | Status: AC
  Administered 2017-10-25: 25 mg via INTRAMUSCULAR

## 2017-10-25 NOTE — Progress Notes (Signed)
Patient states she has been off Phenergan for at least the past two days.  Gypsy Lore, RN

## 2017-11-05 DIAGNOSIS — G5622 Lesion of ulnar nerve, left upper limb: Secondary | ICD-10-CM | POA: Diagnosis not present

## 2017-11-05 DIAGNOSIS — M542 Cervicalgia: Secondary | ICD-10-CM | POA: Diagnosis not present

## 2017-11-05 DIAGNOSIS — G8929 Other chronic pain: Secondary | ICD-10-CM | POA: Diagnosis not present

## 2017-12-05 DIAGNOSIS — M7542 Impingement syndrome of left shoulder: Secondary | ICD-10-CM | POA: Diagnosis not present

## 2017-12-05 DIAGNOSIS — M13812 Other specified arthritis, left shoulder: Secondary | ICD-10-CM | POA: Diagnosis not present

## 2017-12-06 DIAGNOSIS — R1032 Left lower quadrant pain: Secondary | ICD-10-CM | POA: Diagnosis not present

## 2017-12-06 DIAGNOSIS — Z1389 Encounter for screening for other disorder: Secondary | ICD-10-CM | POA: Diagnosis not present

## 2017-12-06 DIAGNOSIS — K5732 Diverticulitis of large intestine without perforation or abscess without bleeding: Secondary | ICD-10-CM | POA: Diagnosis not present

## 2017-12-06 DIAGNOSIS — M1991 Primary osteoarthritis, unspecified site: Secondary | ICD-10-CM | POA: Diagnosis not present

## 2017-12-06 DIAGNOSIS — Z6826 Body mass index (BMI) 26.0-26.9, adult: Secondary | ICD-10-CM | POA: Diagnosis not present

## 2017-12-06 DIAGNOSIS — E663 Overweight: Secondary | ICD-10-CM | POA: Diagnosis not present

## 2017-12-06 DIAGNOSIS — G894 Chronic pain syndrome: Secondary | ICD-10-CM | POA: Diagnosis not present

## 2017-12-17 ENCOUNTER — Other Ambulatory Visit (HOSPITAL_COMMUNITY): Payer: Self-pay | Admitting: Internal Medicine

## 2017-12-17 ENCOUNTER — Ambulatory Visit (HOSPITAL_COMMUNITY)
Admission: RE | Admit: 2017-12-17 | Discharge: 2017-12-17 | Disposition: A | Discharge status: Home / Self Care | Payer: Medicare HMO | Source: Ambulatory Visit | Attending: Internal Medicine | Admitting: Internal Medicine

## 2017-12-17 DIAGNOSIS — R109 Unspecified abdominal pain: Secondary | ICD-10-CM

## 2017-12-17 DIAGNOSIS — Z1231 Encounter for screening mammogram for malignant neoplasm of breast: Secondary | ICD-10-CM | POA: Insufficient documentation

## 2017-12-17 DIAGNOSIS — K5792 Diverticulitis of intestine, part unspecified, without perforation or abscess without bleeding: Secondary | ICD-10-CM

## 2017-12-17 DIAGNOSIS — F419 Anxiety disorder, unspecified: Secondary | ICD-10-CM | POA: Diagnosis not present

## 2017-12-17 DIAGNOSIS — K5730 Diverticulosis of large intestine without perforation or abscess without bleeding: Secondary | ICD-10-CM | POA: Diagnosis not present

## 2017-12-17 DIAGNOSIS — F1120 Opioid dependence, uncomplicated: Secondary | ICD-10-CM | POA: Diagnosis not present

## 2017-12-17 DIAGNOSIS — I700 Atherosclerosis of aorta: Secondary | ICD-10-CM | POA: Insufficient documentation

## 2017-12-17 DIAGNOSIS — E663 Overweight: Secondary | ICD-10-CM | POA: Diagnosis not present

## 2017-12-17 DIAGNOSIS — Z6826 Body mass index (BMI) 26.0-26.9, adult: Secondary | ICD-10-CM | POA: Diagnosis not present

## 2017-12-17 DIAGNOSIS — G894 Chronic pain syndrome: Secondary | ICD-10-CM | POA: Diagnosis not present

## 2017-12-17 MED ORDER — IOPAMIDOL (ISOVUE-300) INJECTION 61%
100.0000 mL | Freq: Once | INTRAVENOUS | Status: AC | PRN
  Administered 2017-12-17: 100 mL via INTRAVENOUS

## 2017-12-25 ENCOUNTER — Inpatient Hospital Stay (HOSPITAL_COMMUNITY): Admission: RE | Admit: 2017-12-25 | Payer: Medicare HMO | Source: Ambulatory Visit

## 2018-01-07 NOTE — Pre-Procedure Instructions (Signed)
Gina Wolfe  01/07/2018      [id number] - [place], [place] [address] [place] [address] Phone: [phone] Fax: [phone]    Your procedure is scheduled on May 9  Report to [place] at 1100 A.M.  Call this number if you have problems the morning of surgery:  [phone]   Remember:  Do not eat food or drink liquids after midnight.  Continue all medications as directed by your physician except follow these medication instructions before surgery below   Take these medicines the morning of surgery with A SIP OF WATER  albuterol (PROVENTIL HFA;VENTOLIN HFA) ALPRAZolam (XANAX) cyclobenzaprine (FLEXERIL) estrogens, conjugated, (PREMARIN) oxyCODONE-acetaminophen (PERCOCET)  7 days prior to surgery STOP taking any Aspirin(unless otherwise instructed by your surgeon), Aleve, Naproxen, Ibuprofen, Motrin, Advil, Goody's, BC's, all herbal medications, fish oil, and all vitamins meloxicam (MOBIC)    Do not wear jewelry, make-up or nail polish.  Do not wear lotions, powders, or perfumes, or deodorant.  Do not shave 48 hours prior to surgery.   Do not bring valuables to the hospital.  [facility] is not responsible for any belongings or valuables.  Contacts, dentures or bridgework may not be worn into surgery.  Leave your suitcase in the car.  After surgery it may be brought to your room.  For patients admitted to the hospital, discharge time will be determined by your treatment team.  Patients discharged the day of surgery will not be allowed to drive home.    Special instructions:   [facility]- Preparing For Surgery  Before surgery, you can play an important role. Because skin is not sterile, your skin needs to be as free of germs as possible. You can reduce the number of germs on your skin by washing with CHG (chlorahexidine gluconate) Soap before surgery.  CHG is an antiseptic cleaner which kills  germs and bonds with the skin to continue killing germs even after washing.  Please do not use if you have an allergy to CHG or antibacterial soaps. If your skin becomes reddened/irritated stop using the CHG.  Do not shave (including legs and underarms) for at least 48 hours prior to first CHG shower. It is OK to shave your face.  Please follow these instructions carefully.   1. Shower the NIGHT BEFORE SURGERY and the MORNING OF SURGERY with CHG.   2. If you chose to wash your hair, wash your hair first as usual with your normal shampoo.  3. After you shampoo, rinse your hair and body thoroughly to remove the shampoo.  4. Use CHG as you would any other liquid soap. You can apply CHG directly to the skin and wash gently with a scrungie or a clean washcloth.   5. Apply the CHG Soap to your body ONLY FROM THE NECK DOWN.  Do not use on open wounds or open sores. Avoid contact with your eyes, ears, mouth and genitals (private parts). Wash Face and genitals (private parts)  with your normal soap.  6. Wash thoroughly, paying special attention to the area where your surgery will be performed.  7. Thoroughly rinse your body with warm water from the neck down.  8. DO NOT shower/wash with your normal soap after using and rinsing off the CHG Soap.  9. Pat yourself dry with a CLEAN TOWEL.  10. Wear CLEAN PAJAMAS to bed the night before surgery, wear comfortable clothes the morning of surgery  11. Place CLEAN SHEETS on your bed the night of your first shower and DO NOT SLEEP WITH PETS.    Day of Surgery: Do not apply any deodorants/lotions. Please wear clean clothes to the hospital/surgery center.      Please read over the following fact sheets that you were given.

## 2018-01-08 ENCOUNTER — Other Ambulatory Visit: Payer: Self-pay

## 2018-01-08 ENCOUNTER — Encounter (HOSPITAL_COMMUNITY): Payer: Self-pay

## 2018-01-08 ENCOUNTER — Encounter (HOSPITAL_COMMUNITY)
Admission: RE | Admit: 2018-01-08 | Discharge: 2018-01-08 | Disposition: A | Discharge status: Home / Self Care | Payer: Medicare HMO | Source: Ambulatory Visit | Attending: Orthopedic Surgery | Admitting: Orthopedic Surgery

## 2018-01-08 DIAGNOSIS — Z01812 Encounter for preprocedural laboratory examination: Secondary | ICD-10-CM | POA: Insufficient documentation

## 2018-01-08 LAB — CBC
HEMATOCRIT: 42.2 % (ref 36.0–46.0)
Hemoglobin: 13.6 g/dL (ref 12.0–15.0)
MCH: 30.6 pg (ref 26.0–34.0)
MCHC: 32.2 g/dL (ref 30.0–36.0)
MCV: 94.8 fL (ref 78.0–100.0)
Platelets: 291 10*3/uL (ref 150–400)
RBC: 4.45 MIL/uL (ref 3.87–5.11)
RDW: 12.6 % (ref 11.5–15.5)
WBC: 6.4 10*3/uL (ref 4.0–10.5)

## 2018-01-08 LAB — SURGICAL PCR SCREEN
MRSA, PCR: NEGATIVE
STAPHYLOCOCCUS AUREUS: POSITIVE — AB

## 2018-01-08 LAB — BASIC METABOLIC PANEL
Anion gap: 8 (ref 5–15)
BUN: 9 mg/dL (ref 6–20)
CALCIUM: 8.9 mg/dL (ref 8.9–10.3)
CHLORIDE: 103 mmol/L (ref 101–111)
CO2: 27 mmol/L (ref 22–32)
CREATININE: 0.61 mg/dL (ref 0.44–1.00)
GFR calc Af Amer: >60 mL/min (ref >60)
GFR calc non Af Amer: >60 mL/min (ref >60)
Glucose, Bld: 64 mg/dL — ABNORMAL LOW (ref 65–99)
POTASSIUM: 3.6 mmol/L (ref 3.5–5.1)
Sodium: 138 mmol/L (ref 135–145)

## 2018-01-17 ENCOUNTER — Ambulatory Visit (HOSPITAL_COMMUNITY)
Admission: RE | Admit: 2018-01-17 | Discharge: 2018-01-17 | Disposition: A | Discharge status: Home / Self Care | Payer: Medicare HMO | Source: Ambulatory Visit | Attending: Orthopedic Surgery | Admitting: Orthopedic Surgery

## 2018-01-17 ENCOUNTER — Ambulatory Visit (HOSPITAL_COMMUNITY): Payer: Medicare HMO | Admitting: Certified Registered"

## 2018-01-17 ENCOUNTER — Encounter (HOSPITAL_COMMUNITY)
Admission: RE | Disposition: A | Discharge status: Home / Self Care | Payer: Self-pay | Source: Ambulatory Visit | Attending: Orthopedic Surgery

## 2018-01-17 DIAGNOSIS — M19012 Primary osteoarthritis, left shoulder: Secondary | ICD-10-CM | POA: Diagnosis not present

## 2018-01-17 DIAGNOSIS — X58XXXA Exposure to other specified factors, initial encounter: Secondary | ICD-10-CM | POA: Insufficient documentation

## 2018-01-17 DIAGNOSIS — Z87891 Personal history of nicotine dependence: Secondary | ICD-10-CM | POA: Diagnosis not present

## 2018-01-17 DIAGNOSIS — F419 Anxiety disorder, unspecified: Secondary | ICD-10-CM | POA: Insufficient documentation

## 2018-01-17 DIAGNOSIS — Y929 Unspecified place or not applicable: Secondary | ICD-10-CM | POA: Diagnosis not present

## 2018-01-17 DIAGNOSIS — S43432A Superior glenoid labrum lesion of left shoulder, initial encounter: Secondary | ICD-10-CM | POA: Diagnosis not present

## 2018-01-17 DIAGNOSIS — G894 Chronic pain syndrome: Secondary | ICD-10-CM | POA: Insufficient documentation

## 2018-01-17 DIAGNOSIS — Z79899 Other long term (current) drug therapy: Secondary | ICD-10-CM | POA: Insufficient documentation

## 2018-01-17 DIAGNOSIS — J449 Chronic obstructive pulmonary disease, unspecified: Secondary | ICD-10-CM | POA: Insufficient documentation

## 2018-01-17 DIAGNOSIS — M7542 Impingement syndrome of left shoulder: Secondary | ICD-10-CM | POA: Insufficient documentation

## 2018-01-17 DIAGNOSIS — J45909 Unspecified asthma, uncomplicated: Secondary | ICD-10-CM | POA: Diagnosis not present

## 2018-01-17 DIAGNOSIS — M75112 Incomplete rotator cuff tear or rupture of left shoulder, not specified as traumatic: Secondary | ICD-10-CM | POA: Insufficient documentation

## 2018-01-17 DIAGNOSIS — K219 Gastro-esophageal reflux disease without esophagitis: Secondary | ICD-10-CM | POA: Diagnosis not present

## 2018-01-17 SURGERY — SHOULDER ARTHROSCOPY WITH SUBACROMIAL DECOMPRESSION AND DISTAL CLAVICLE EXCISION
Anesthesia: General | Site: Shoulder | Laterality: Left

## 2018-01-17 MED ORDER — PROMETHAZINE HCL 25 MG/ML IJ SOLN
6.2500 mg | INTRAMUSCULAR | Status: DC | PRN
  Administered 2018-01-17: 6.25 mg via INTRAVENOUS

## 2018-01-17 MED ORDER — ROCURONIUM BROMIDE 10 MG/ML (PF) SYRINGE
PREFILLED_SYRINGE | INTRAVENOUS | Status: DC | PRN
  Administered 2018-01-17: 30 mg via INTRAVENOUS

## 2018-01-17 MED ORDER — FENTANYL CITRATE (PF) 250 MCG/5ML IJ SOLN
INTRAMUSCULAR | Status: DC | PRN
  Administered 2018-01-17: 100 ug via INTRAVENOUS

## 2018-01-17 MED ORDER — PROPOFOL 10 MG/ML IV BOLUS
INTRAVENOUS | Status: AC
  Filled 2018-01-17: qty 20

## 2018-01-17 MED ORDER — SUGAMMADEX SODIUM 200 MG/2ML IV SOLN
INTRAVENOUS | Status: DC | PRN
  Administered 2018-01-17: 128.6 mg via INTRAVENOUS

## 2018-01-17 MED ORDER — KETOROLAC TROMETHAMINE 30 MG/ML IJ SOLN
INTRAMUSCULAR | Status: AC
  Filled 2018-01-17: qty 1

## 2018-01-17 MED ORDER — ROCURONIUM BROMIDE 50 MG/5ML IV SOLN
INTRAVENOUS | Status: AC
  Filled 2018-01-17: qty 1

## 2018-01-17 MED ORDER — SODIUM CHLORIDE 0.9 % IR SOLN
Status: DC | PRN
  Administered 2018-01-17 (×2): 3000 mL

## 2018-01-17 MED ORDER — LIDOCAINE 2% (20 MG/ML) 5 ML SYRINGE
INTRAMUSCULAR | Status: AC
  Filled 2018-01-17: qty 5

## 2018-01-17 MED ORDER — HYDROMORPHONE HCL 2 MG/ML IJ SOLN
0.2500 mg | INTRAMUSCULAR | Status: DC | PRN
  Administered 2018-01-17: 0.5 mg via INTRAVENOUS

## 2018-01-17 MED ORDER — MIDAZOLAM HCL 2 MG/2ML IJ SOLN
INTRAMUSCULAR | Status: AC
  Administered 2018-01-17: 2 mg
  Filled 2018-01-17: qty 2

## 2018-01-17 MED ORDER — PROMETHAZINE HCL 25 MG PO TABS: 12.5000 mg | ORAL_TABLET | Freq: Four times a day (QID) | ORAL | 1 refills | Status: DC | PRN

## 2018-01-17 MED ORDER — DEXAMETHASONE SODIUM PHOSPHATE 10 MG/ML IJ SOLN
INTRAMUSCULAR | Status: DC | PRN
  Administered 2018-01-17: 5 mg via INTRAVENOUS

## 2018-01-17 MED ORDER — CEFAZOLIN SODIUM-DEXTROSE 2-3 GM-%(50ML) IV SOLR
INTRAVENOUS | Status: DC | PRN
  Administered 2018-01-17: 2 g via INTRAVENOUS

## 2018-01-17 MED ORDER — FENTANYL CITRATE (PF) 100 MCG/2ML IJ SOLN
100.0000 ug | Freq: Once | INTRAMUSCULAR | Status: AC
  Administered 2018-01-17: 100 ug via INTRAVENOUS

## 2018-01-17 MED ORDER — MIDAZOLAM HCL 2 MG/2ML IJ SOLN: 2.0000 mg | Freq: Once | INTRAMUSCULAR | Status: DC

## 2018-01-17 MED ORDER — ONDANSETRON HCL 4 MG/2ML IJ SOLN
INTRAMUSCULAR | Status: DC | PRN
  Administered 2018-01-17: 4 mg via INTRAVENOUS

## 2018-01-17 MED ORDER — MIDAZOLAM HCL 2 MG/2ML IJ SOLN
INTRAMUSCULAR | Status: AC
  Filled 2018-01-17: qty 2

## 2018-01-17 MED ORDER — PROPOFOL 10 MG/ML IV BOLUS
INTRAVENOUS | Status: DC | PRN
  Administered 2018-01-17: 150 mg via INTRAVENOUS

## 2018-01-17 MED ORDER — MIDAZOLAM HCL 5 MG/5ML IJ SOLN
INTRAMUSCULAR | Status: DC | PRN
  Administered 2018-01-17: 2 mg via INTRAVENOUS

## 2018-01-17 MED ORDER — LIDOCAINE 2% (20 MG/ML) 5 ML SYRINGE
INTRAMUSCULAR | Status: DC | PRN
  Administered 2018-01-17: 80 mg via INTRAVENOUS

## 2018-01-17 MED ORDER — FENTANYL CITRATE (PF) 250 MCG/5ML IJ SOLN
INTRAMUSCULAR | Status: AC
  Filled 2018-01-17: qty 5

## 2018-01-17 MED ORDER — LACTATED RINGERS IV SOLN
INTRAVENOUS | Status: DC
  Administered 2018-01-17 (×2): via INTRAVENOUS

## 2018-01-17 MED ORDER — PROMETHAZINE HCL 25 MG/ML IJ SOLN
INTRAMUSCULAR | Status: AC
  Filled 2018-01-17: qty 1

## 2018-01-17 MED ORDER — KETOROLAC TROMETHAMINE 30 MG/ML IJ SOLN
30.0000 mg | Freq: Once | INTRAMUSCULAR | Status: AC | PRN
  Administered 2018-01-17: 30 mg via INTRAVENOUS

## 2018-01-17 MED ORDER — HYDROMORPHONE HCL 2 MG PO TABS: 2.0000 mg | ORAL_TABLET | ORAL | 0 refills | Status: DC | PRN

## 2018-01-17 MED ORDER — FENTANYL CITRATE (PF) 100 MCG/2ML IJ SOLN
INTRAMUSCULAR | Status: AC
  Filled 2018-01-17: qty 2

## 2018-01-17 MED ORDER — HYDROMORPHONE HCL 2 MG/ML IJ SOLN
INTRAMUSCULAR | Status: AC
  Filled 2018-01-17: qty 1

## 2018-01-17 MED ORDER — SUGAMMADEX SODIUM 200 MG/2ML IV SOLN
INTRAVENOUS | Status: AC
  Filled 2018-01-17: qty 2

## 2018-01-17 MED ORDER — MEPERIDINE HCL 50 MG/ML IJ SOLN: 6.2500 mg | INTRAMUSCULAR | Status: DC | PRN

## 2018-01-17 SURGICAL SUPPLY — 57 items
BLADE CUTTER GATOR 3.5 (BLADE) IMPLANT
BLADE EXCALIBUR 4.0X13 (MISCELLANEOUS) ×2 IMPLANT
BLADE GREAT WHITE 4.2 (BLADE) ×1 IMPLANT
BLADE SURG 11 STRL SS (BLADE) ×2 IMPLANT
BOOTCOVER CLEANROOM LRG (PROTECTIVE WEAR) ×4 IMPLANT
BUR OVAL 4.0 (BURR) ×1 IMPLANT
CANISTER SUCT LVC 12 LTR MEDI- (MISCELLANEOUS) ×2 IMPLANT
CANNULA ACUFLEX KIT 5X76 (CANNULA) ×4 IMPLANT
CANNULA DRILOCK 5.0X75 (CANNULA) IMPLANT
CONNECTOR 5 IN 1 STRAIGHT STRL (MISCELLANEOUS) ×2 IMPLANT
DISSECTOR  3.8MM X 13CM (MISCELLANEOUS) ×1
DISSECTOR 3.8MM X 13CM (MISCELLANEOUS) IMPLANT
DRAPE INCISE 23X17 IOBAN STRL (DRAPES)
DRAPE INCISE 23X17 STRL (DRAPES) IMPLANT
DRAPE INCISE IOBAN 23X17 STRL (DRAPES) IMPLANT
DRAPE INCISE IOBAN 66X45 STRL (DRAPES) ×2 IMPLANT
DRAPE ORTHO SPLIT 77X108 STRL (DRAPES) ×4
DRAPE STERI 35X30 U-POUCH (DRAPES) IMPLANT
DRAPE SURG 17X11 SM STRL (DRAPES) ×2 IMPLANT
DRAPE SURG ORHT 6 SPLT 77X108 (DRAPES) ×2 IMPLANT
DRAPE U-SHAPE 47X51 STRL (DRAPES) IMPLANT
DRSG ADAPTIC 3X8 NADH LF (GAUZE/BANDAGES/DRESSINGS) ×2 IMPLANT
DRSG PAD ABDOMINAL 8X10 ST (GAUZE/BANDAGES/DRESSINGS) ×4 IMPLANT
DURAPREP 26ML APPLICATOR (WOUND CARE) ×2 IMPLANT
GAUZE SPONGE 4X4 12PLY STRL (GAUZE/BANDAGES/DRESSINGS) ×2 IMPLANT
GLOVE BIO SURGEON STRL SZ7.5 (GLOVE) ×2 IMPLANT
GLOVE BIO SURGEON STRL SZ8 (GLOVE) ×2 IMPLANT
GLOVE EUDERMIC 7 POWDERFREE (GLOVE) ×2 IMPLANT
GLOVE SS BIOGEL STRL SZ 7.5 (GLOVE) ×1 IMPLANT
GLOVE SUPERSENSE BIOGEL SZ 7.5 (GLOVE) ×1
GOWN STRL REUS W/ TWL LRG LVL3 (GOWN DISPOSABLE) ×1 IMPLANT
GOWN STRL REUS W/ TWL XL LVL3 (GOWN DISPOSABLE) ×2 IMPLANT
GOWN STRL REUS W/TWL LRG LVL3 (GOWN DISPOSABLE) ×2
GOWN STRL REUS W/TWL XL LVL3 (GOWN DISPOSABLE) ×4
KIT BASIN OR (CUSTOM PROCEDURE TRAY) ×2 IMPLANT
KIT SHOULDER TRACTION (DRAPES) ×2 IMPLANT
KIT TURNOVER KIT B (KITS) ×2 IMPLANT
MANIFOLD NEPTUNE II (INSTRUMENTS) ×2 IMPLANT
NEEDLE SPNL 18GX3.5 QUINCKE PK (NEEDLE) ×2 IMPLANT
NS IRRIG 1000ML POUR BTL (IV SOLUTION) ×2 IMPLANT
PACK SHOULDER (CUSTOM PROCEDURE TRAY) ×2 IMPLANT
PAD ARMBOARD 7.5X6 YLW CONV (MISCELLANEOUS) ×4 IMPLANT
SET ARTHROSCOPY TUBING (MISCELLANEOUS) ×2
SET ARTHROSCOPY TUBING LN (MISCELLANEOUS) ×1 IMPLANT
SLING ARM FOAM STRAP LRG (SOFTGOODS) IMPLANT
SLING ARM FOAM STRAP MED (SOFTGOODS) ×2 IMPLANT
SPONGE LAP 4X18 X RAY DECT (DISPOSABLE) IMPLANT
STRIP CLOSURE SKIN 1/2X4 (GAUZE/BANDAGES/DRESSINGS) ×2 IMPLANT
SUT MNCRL AB 3-0 PS2 18 (SUTURE) ×2 IMPLANT
SUT PDS AB 0 CT 36 (SUTURE) IMPLANT
SYR 20CC LL (SYRINGE) IMPLANT
TAPE PAPER 3X10 WHT MICROPORE (GAUZE/BANDAGES/DRESSINGS) ×2 IMPLANT
TAPE PAPER MEDFIX 1IN X 10YD (GAUZE/BANDAGES/DRESSINGS) ×2 IMPLANT
TOWEL OR 17X24 6PK STRL BLUE (TOWEL DISPOSABLE) ×2 IMPLANT
TOWEL OR 17X26 10 PK STRL BLUE (TOWEL DISPOSABLE) ×2 IMPLANT
WAND SUCTION MAX 4MM 90S (SURGICAL WAND) ×2 IMPLANT
WATER STERILE IRR 1000ML POUR (IV SOLUTION) ×2 IMPLANT

## 2018-01-17 NOTE — Anesthesia Preprocedure Evaluation (Addendum)
Anesthesia Evaluation  Patient identified by MRN, date of birth, ID band Patient awake    Reviewed: Allergy & Precautions, NPO status , Patient's Chart, lab work & pertinent test results  History of Anesthesia Complications (+) PONV, POST - OP SPINAL HEADACHE and history of anesthetic complications  Airway Mallampati: I       Dental no notable dental hx. (+) Teeth Intact   Pulmonary former smoker,    Pulmonary exam normal breath sounds clear to auscultation       Cardiovascular negative cardio ROS Normal cardiovascular exam Rhythm:Regular Rate:Normal     Neuro/Psych PSYCHIATRIC DISORDERS Anxiety    GI/Hepatic Neg liver ROS,   Endo/Other  negative endocrine ROS  Renal/GU negative Renal ROS  negative genitourinary   Musculoskeletal  (+) Arthritis , Osteoarthritis,    Abdominal Normal abdominal exam  (+)   Peds  Hematology negative hematology ROS (+)   Anesthesia Other Findings   Reproductive/Obstetrics                            Anesthesia Physical Anesthesia Plan  ASA: II  Anesthesia Plan: General   Post-op Pain Management:  Regional for Post-op pain   Induction: Intravenous  PONV Risk Score and Plan: 4 or greater and Ondansetron, Dexamethasone, Midazolam and Scopolamine patch - Pre-op  Airway Management Planned: Oral ETT and Video Laryngoscope Planned  Additional Equipment:   Intra-op Plan:   Post-operative Plan: Extubation in OR  Informed Consent: I have reviewed the patients History and Physical, chart, labs and discussed the procedure including the risks, benefits and alternatives for the proposed anesthesia with the patient or authorized representative who has indicated his/her understanding and acceptance.   Dental advisory given  Plan Discussed with: CRNA and Surgeon  Anesthesia Plan Comments:        Anesthesia Quick Evaluation

## 2018-01-17 NOTE — Transfer of Care (Signed)
Immediate Anesthesia Transfer of Care Note  Patient: Gina Wolfe  Procedure(s) Performed: LEFT SHOULDER ARTHROSCOPY WITH SUBACROMIAL DECOMPRESSION LABRAL DEBRIDEMENT AND ROTATOR CUFF DEBRIDEMENT (Left Shoulder)  Patient Location: PACU  Anesthesia Type:GA combined with regional for post-op pain  Level of Consciousness: drowsy and patient cooperative  Airway & Oxygen Therapy: Patient Spontanous Breathing and Patient connected to face mask oxygen  Post-op Assessment: Report given to RN and Post -op Vital signs reviewed and stable  Post vital signs: Reviewed and stable  Last Vitals:  Vitals Value Taken Time  BP 93/73 01/17/2018  2:24 PM  Temp    Pulse 79 01/17/2018  2:29 PM  Resp 13 01/17/2018  2:29 PM  SpO2 97 % 01/17/2018  2:29 PM  Vitals shown include unvalidated device data.  Last Pain:  Vitals:   01/17/18 1225  TempSrc:   PainSc: 2       Patients Stated Pain Goal: 3 (11/09/29 4388)  Complications: No apparent anesthesia complications

## 2018-01-17 NOTE — Op Note (Signed)
01/17/2018  2:29 PM  PATIENT:   Gina Wolfe  62 y.o. 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  ASSISTANTS: Shuford pac   ANESTHESIA:   GET + ISB  EBL: min  SPECIMEN:  none  Drains: none   PATIENT DISPOSITION:  PACU - hemodynamically stable.  Patient is a 62 year old female with chronic left shoulder pain.  Her symptoms have been refractory to prolonged attempts at conservative management.  Due to ongoing pain and functional rotation she is brought to the operating December plan left shoulder arthroscopy as described below  Probably counseled [name] regarding treatment options, as well as the potential risks versus benefits thereof.  Possible surgical complications reviewed including potential bleeding, infection, neurovascular injury, persistent pain, loss of motion, anesthetic complication, and possible need for additional surgery.  She understands and accepts and agrees to the planned procedure.  Procedure detail:  Patient brought to the holding area where in an antibiotic was used prophylactically and an interscalene block was established in the left upper extremity.  Subsequent brought to the operating placed supine on the operating room underwent smooth induction of a general endotracheal anesthesia.  The right lateral decubitus position on a beanbag in a properly padded protected.  Left shoulder examination under anesthesia revealed full motion.  No stability patterns noted.  Left arm suspended at 70 degrees of abduction with 10 pounds traction left shoulder region was sterilely prepped and draped in standard fashion.  Timeout was called.  Posterior portal sepsis including joint intraportal service under direct visualization.   Articular surfaces were found to be in overall good condition with the exception of a small area of condyle chondromalacia posterior inferiorly with an associated posterior labral tear which was debrided.  There is also a type I SLAP lesion which was debrided.  The biceps tendon was normal in caliber with no obvious proximal or distal instability.  The rotator cuff showed partial articular sided tearing which were debrided with shaver and I would estimate that this is no more than perhaps 5 to 10% of the tendon.  This ear was debrided back to healthy tissue.  The balance of the rotator cuff was intact and no additional intra-articular pathologies were identified.  Fluid and instruments were then removed.  The opposite drop down to 30 degrees of abduction arthroscope introduced in the subacromial space the posterior portal and a direct lateral portal established in the subacromial space.  Abundant dense bursal tissue multiple adhesions were encountered which were all divided and excised from the [employer] wand.  One was then used to remove the periosteum from the undersurface of the anterior half of the acromion and subacromial decompression was performed to the bur critical type I morphology.  Portals does have a strictly anterior to the distal clavicle and a distal clavicle resection was performed with a bur.  There was taken to confirm visualization of the entire circumference of the distal clavicle to ensure adequate bone.  We then completed subacromial/subdeltoid bursectomy.  Bursal surface of vertigo was found to be intact.  Final hemostasis was obtained.  Fluid meniscus removed.  The portals were closed with Monocryl and Steri-Strip.  Dry dressing dip and left shoulder arm placed in a sling and patient was awakened x-rayed in stable condition.  True issue  for PA-C was used as an Environmental consultant throughout this case essential to help with positioning the patient positioning the extremity tissue manipulation  seizure management including an intraoperative decision making.      PLAN OF CARE: Discharge to home after PACU    Contact # [phone]

## 2018-01-17 NOTE — Anesthesia Procedure Notes (Signed)
Procedure Name: Intubation Date/Time: 01/17/2018 1:15 PM Performed by: Freddie Breech, CRNA Pre-anesthesia Checklist: Patient identified, Emergency Drugs available, Suction available and Patient being monitored Patient Re-evaluated:Patient Re-evaluated prior to induction Oxygen Delivery Method: Circle System Utilized Preoxygenation: Pre-oxygenation with 100% oxygen Induction Type: IV induction Ventilation: Mask ventilation without difficulty Laryngoscope Size: Glidescope and 3 Grade View: Grade I Tube type: Oral Tube size: 7.0 mm Number of attempts: 1 Airway Equipment and Method: Stylet,  Oral airway and Video-laryngoscopy Placement Confirmation: positive ETCO2 and breath sounds checked- equal and bilateral (Video laryngoscopy confirmed ETT passing through open cords.  ) Secured at: 21 cm Tube secured with: Tape Dental Injury: Teeth and Oropharynx as per pre-operative assessment

## 2018-01-17 NOTE — H&P (Signed)
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 female with Chronic left shoulder impingement refractory to conservative management.  Past Medical History:  Diagnosis Date  . Anxiety   . Arthritis   . Asthma   . Chronic pain syndrome   . COPD (chronic obstructive pulmonary disease) ([place])   . Diverticulitis   . Dyspnea   . GERD (gastroesophageal reflux disease)   . Heart murmur    As child  . IBS (irritable bowel syndrome)   . PONV (postoperative nausea and vomiting)   . Spinal headache     Past Surgical History:  Procedure Laterality Date  . ABDOMINAL HYSTERECTOMY    . BACK SURGERY     x3 previous lumbar, x3 cervical surgeries  . CHOLECYSTECTOMY    . COLONOSCOPY  05/20/10   JKK:XFGHWEXH hemorrhoids otherwise normal/EGD:distal esophageal erosions otherwise normal  . CYST EXCISION Right    Eye cyst  . EYE SURGERY Right    removed a cyst  . LUMBAR LAMINECTOMY/DECOMPRESSION MICRODISCECTOMY N/A 07/13/2016   Procedure: DISCECTOMY Lumbar four-five;  Surgeon: Newman Pies, MD;  Location: [facility];  Service: Neurosurgery;  Laterality: N/A;  . menicus repair  2007   R knee   . POSTERIOR CERVICAL FUSION/FORAMINOTOMY N/A 04/10/2013   Procedure: POSTERIOR CERVICAL FUSION/FORAMINOTOMY LEVEL 2;  Surgeon: Sinclair Ship, MD;  Location: [facility];  Service: Orthopedics;  Laterality: N/A;  Posterior cervical decompression fusion, cervical 5-6, cervical 6-7 with instrumentation and allograft.  . TUBAL LIGATION      Family History  Problem Relation Age of Onset  . Hypertension Mother   . Emphysema Mother   . Cancer Father   . Hypertension Other   . Diabetes Other   . Colon cancer Neg Hx   . Colon polyps Neg Hx     Social History:  reports that she quit smoking about 10 years ago. Her smoking use included cigarettes. She has never used smokeless tobacco. She reports that she does not drink alcohol  or use drugs.   Medications Prior to Admission  Medication Sig Dispense Refill  . albuterol (PROVENTIL HFA;VENTOLIN HFA) 108 (90 Base) MCG/ACT inhaler Inhale 2 puffs into the lungs every 6 (six) hours as needed for wheezing or shortness of breath.     . ALPRAZolam (XANAX) 1 MG tablet Take 1 mg by mouth 4 (four) times daily as needed for anxiety.     . cyclobenzaprine (FLEXERIL) 10 MG tablet Take 10 mg by mouth 2 (two) times daily as needed for muscle spasms.    Marland Kitchen estrogens, conjugated, (PREMARIN) 0.625 MG tablet Take 0.625 mg by mouth daily with breakfast.     . furosemide (LASIX) 20 MG tablet Take 20 mg by mouth daily as needed for edema.     Marland Kitchen linaclotide (LINZESS) 145 MCG CAPS capsule Take 145 mcg by mouth daily as needed (constipation).    . meloxicam (MOBIC) 7.5 MG tablet Take 7.5 mg by mouth 2 (two) times daily.     Marland Kitchen oxyCODONE-acetaminophen (PERCOCET) 10-325 MG tablet Take 1 tablet by mouth every 4 (four) hours as needed for pain. (Patient taking differently: Take 1 tablet by mouth every 6 (six) hours as needed for pain. ) 100 tablet 0  . promethazine (PHENERGAN) 25 MG tablet Take 25 mg by mouth 2 (two) times daily as needed for nausea or vomiting.    . lubiprostone (AMITIZA) 24 MCG capsule Take 1 capsule (24 mcg  total) by mouth 2 (two) times daily with a meal. 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

## 2018-01-17 NOTE — Anesthesia Postprocedure Evaluation (Addendum)
Anesthesia Post Note  Patient: Gina Wolfe  Procedure(s) Performed: LEFT SHOULDER ARTHROSCOPY WITH SUBACROMIAL DECOMPRESSION LABRAL DEBRIDEMENT AND ROTATOR CUFF DEBRIDEMENT (Left Shoulder)     Patient location during evaluation: PACU Anesthesia Type: General Level of consciousness: awake Pain management: pain level controlled Vital Signs Assessment: post-procedure vital signs reviewed and stable Respiratory status: spontaneous breathing Cardiovascular status: stable Anesthetic complications: no    Last Vitals:  Vitals:   01/17/18 1230 01/17/18 1424  BP: (!) 115/59 93/73  Pulse: 78 87  Resp: 15 15  Temp:  36.6 C  SpO2: 99% 100%    Last Pain:  Vitals:   01/17/18 1424  TempSrc:   PainSc: 0-No pain   Pain Goal: Patients Stated Pain Goal: 3 (01/17/18 1122)               [place]

## 2018-01-17 NOTE — Discharge Instructions (Signed)
° °  Gina Wolfe. Supple, M.D., F.A.A.O.S. Orthopaedic Surgery Specializing in Arthroscopic and Reconstructive Surgery of the Shoulder and Knee [phone] [address] [place], [place] [address] - Fax [phone]   POST-OP SHOULDER ARTHROSCOPY INSTRUCTIONS  1. Call the office at [phone] to schedule your first post-op appointment 7-10 days from the date of your surgery.  2. Leave the steri-strips in place over your incisions when performing dressing changes and showering. You may remove your dressings and begin showering 72 hours from surgery. You can expect drainage that is clear to bloody in nature that occasionally will soak through your dressings. If this occurs go ahead and perform a dressing change. The drainage should lessen daily and when there is no drainage from your incisions feel free to go without a dressing.  3. Wear your sling for comfort. You may come out of your sling for ad lib activity and even decide not to use the sling at all. If you find you are more comfortable in your sling, make sure you come out of your sling at least 3-4 times a day to do the exercises that are included below.  4. Range of motion to your elbow, wrist, and hand are encouraged 3-5 times daily. Exercise to your hand and fingers helps to reduce swelling you may experience.  5. Utilize ice to the shoulder 3-4 times minimum a day and additionally if you are experiencing pain.  6. You may drive when safely off narcotics and muscle relaxants.  7. If you had a block pre-operatively to provide post-op pain relief you may want to go ahead and begin utilizing your pain meds as your arm begins to wake up. Blocks can sometimes last up to 16-18 hours. If you are still pain-free prior to going to bed you may want to strongly consider taking a pain medication to avoid being awakened in the night with the onset of pain. A muscle relaxant is also provided for you should you experience muscle spasms. It  is recommended that if you are experiencing pain that your pain medication alone is not controlling, add the muscle relaxant along with the pain medication which can give additional pain relief. The first one to two days is generally the most severe of your pain and then should gradually decrease. As your pain lessens it is recommended that you decrease your use of the pain medications to an "as needed basis" only and to always comply with the recommended dosages of the pain medications.  8. Pain medications can produce constipation along with their use. If you experience this, the use of an over the counter stool softener or laxative daily is recommended.   9. For additional questions or concerns, please do not hesitate to call the office. If after hours there is an answering service to forward your concerns to the physician on call.   POST-OP EXERCISES  The pendulum exercises should be performed while bending at the waist as far over as possible thereby letting gravity do the work for you.  Range of Motion Exercises: Pendulum (circular)  Repeat 20 times. Do 3 sessions per day.     Range of Motion Exercises: Pendulum (side-to-side)  Repeat 20 times. Do 3 sessions per day.    Range of Motion Exercises (self-stretching activities):  Slide arm up wall with palm toward you, moving closer to the wall. Hold for 5 seconds.  Repeat 10 times. Do 3 sessions per day.

## 2018-03-18 DIAGNOSIS — G43909 Migraine, unspecified, not intractable, without status migrainosus: Secondary | ICD-10-CM | POA: Diagnosis not present

## 2018-03-18 DIAGNOSIS — Z6826 Body mass index (BMI) 26.0-26.9, adult: Secondary | ICD-10-CM | POA: Diagnosis not present

## 2018-03-18 DIAGNOSIS — Z0001 Encounter for general adult medical examination with abnormal findings: Secondary | ICD-10-CM | POA: Diagnosis not present

## 2018-03-18 DIAGNOSIS — G894 Chronic pain syndrome: Secondary | ICD-10-CM | POA: Diagnosis not present

## 2018-03-18 DIAGNOSIS — K219 Gastro-esophageal reflux disease without esophagitis: Secondary | ICD-10-CM | POA: Diagnosis not present

## 2018-03-20 DIAGNOSIS — Z1389 Encounter for screening for other disorder: Secondary | ICD-10-CM | POA: Diagnosis not present

## 2018-03-20 DIAGNOSIS — Z0001 Encounter for general adult medical examination with abnormal findings: Secondary | ICD-10-CM | POA: Diagnosis not present

## 2018-03-20 DIAGNOSIS — E663 Overweight: Secondary | ICD-10-CM | POA: Diagnosis not present

## 2018-03-20 DIAGNOSIS — Z0000 Encounter for general adult medical examination without abnormal findings: Secondary | ICD-10-CM | POA: Diagnosis not present

## 2018-03-20 DIAGNOSIS — Z6826 Body mass index (BMI) 26.0-26.9, adult: Secondary | ICD-10-CM | POA: Diagnosis not present

## 2018-03-26 DIAGNOSIS — M5136 Other intervertebral disc degeneration, lumbar region: Secondary | ICD-10-CM | POA: Diagnosis not present

## 2018-03-26 DIAGNOSIS — Z6826 Body mass index (BMI) 26.0-26.9, adult: Secondary | ICD-10-CM | POA: Diagnosis not present

## 2018-03-26 DIAGNOSIS — M5410 Radiculopathy, site unspecified: Secondary | ICD-10-CM | POA: Diagnosis not present

## 2018-03-26 DIAGNOSIS — G894 Chronic pain syndrome: Secondary | ICD-10-CM | POA: Diagnosis not present

## 2018-04-17 DIAGNOSIS — Z6826 Body mass index (BMI) 26.0-26.9, adult: Secondary | ICD-10-CM | POA: Diagnosis not present

## 2018-04-17 DIAGNOSIS — M5442 Lumbago with sciatica, left side: Secondary | ICD-10-CM | POA: Diagnosis not present

## 2018-04-17 DIAGNOSIS — R030 Elevated blood-pressure reading, without diagnosis of hypertension: Secondary | ICD-10-CM | POA: Diagnosis not present

## 2018-05-16 DIAGNOSIS — K219 Gastro-esophageal reflux disease without esophagitis: Secondary | ICD-10-CM | POA: Diagnosis not present

## 2018-05-16 DIAGNOSIS — E538 Deficiency of other specified B group vitamins: Secondary | ICD-10-CM | POA: Diagnosis not present

## 2018-05-16 DIAGNOSIS — G894 Chronic pain syndrome: Secondary | ICD-10-CM | POA: Diagnosis not present

## 2018-05-16 DIAGNOSIS — Z6826 Body mass index (BMI) 26.0-26.9, adult: Secondary | ICD-10-CM | POA: Diagnosis not present

## 2018-05-16 DIAGNOSIS — R1310 Dysphagia, unspecified: Secondary | ICD-10-CM | POA: Diagnosis not present

## 2018-05-16 DIAGNOSIS — E663 Overweight: Secondary | ICD-10-CM | POA: Diagnosis not present

## 2018-05-16 DIAGNOSIS — M159 Polyosteoarthritis, unspecified: Secondary | ICD-10-CM | POA: Diagnosis not present

## 2018-05-16 DIAGNOSIS — J392 Other diseases of pharynx: Secondary | ICD-10-CM | POA: Diagnosis not present

## 2018-05-16 DIAGNOSIS — J45909 Unspecified asthma, uncomplicated: Secondary | ICD-10-CM | POA: Diagnosis not present

## 2018-06-10 DIAGNOSIS — Z049 Encounter for examination and observation for unspecified reason: Secondary | ICD-10-CM | POA: Diagnosis not present

## 2018-06-10 DIAGNOSIS — G43709 Chronic migraine without aura, not intractable, without status migrainosus: Secondary | ICD-10-CM | POA: Diagnosis not present

## 2018-06-12 DIAGNOSIS — M791 Myalgia, unspecified site: Secondary | ICD-10-CM | POA: Diagnosis not present

## 2018-06-12 DIAGNOSIS — G43709 Chronic migraine without aura, not intractable, without status migrainosus: Secondary | ICD-10-CM | POA: Diagnosis not present

## 2018-06-12 DIAGNOSIS — M542 Cervicalgia: Secondary | ICD-10-CM | POA: Diagnosis not present

## 2018-06-12 DIAGNOSIS — G518 Other disorders of facial nerve: Secondary | ICD-10-CM | POA: Diagnosis not present

## 2018-06-12 DIAGNOSIS — R51 Headache: Secondary | ICD-10-CM | POA: Diagnosis not present

## 2018-06-13 DIAGNOSIS — G629 Polyneuropathy, unspecified: Secondary | ICD-10-CM | POA: Diagnosis not present

## 2018-06-13 DIAGNOSIS — G5600 Carpal tunnel syndrome, unspecified upper limb: Secondary | ICD-10-CM | POA: Diagnosis not present

## 2018-07-03 DIAGNOSIS — Z23 Encounter for immunization: Secondary | ICD-10-CM | POA: Diagnosis not present

## 2018-07-03 DIAGNOSIS — Z6827 Body mass index (BMI) 27.0-27.9, adult: Secondary | ICD-10-CM | POA: Diagnosis not present

## 2018-07-03 DIAGNOSIS — M2550 Pain in unspecified joint: Secondary | ICD-10-CM | POA: Diagnosis not present

## 2018-07-03 DIAGNOSIS — G894 Chronic pain syndrome: Secondary | ICD-10-CM | POA: Diagnosis not present

## 2018-07-03 DIAGNOSIS — G43909 Migraine, unspecified, not intractable, without status migrainosus: Secondary | ICD-10-CM | POA: Diagnosis not present

## 2018-07-03 DIAGNOSIS — G5763 Lesion of plantar nerve, bilateral lower limbs: Secondary | ICD-10-CM | POA: Diagnosis not present

## 2018-08-06 DIAGNOSIS — M1991 Primary osteoarthritis, unspecified site: Secondary | ICD-10-CM | POA: Diagnosis not present

## 2018-08-06 DIAGNOSIS — F419 Anxiety disorder, unspecified: Secondary | ICD-10-CM | POA: Diagnosis not present

## 2018-08-06 DIAGNOSIS — J329 Chronic sinusitis, unspecified: Secondary | ICD-10-CM | POA: Diagnosis not present

## 2018-08-06 DIAGNOSIS — G894 Chronic pain syndrome: Secondary | ICD-10-CM | POA: Diagnosis not present

## 2018-08-06 DIAGNOSIS — J042 Acute laryngotracheitis: Secondary | ICD-10-CM | POA: Diagnosis not present

## 2018-08-06 DIAGNOSIS — Z6827 Body mass index (BMI) 27.0-27.9, adult: Secondary | ICD-10-CM | POA: Diagnosis not present

## 2018-08-06 DIAGNOSIS — R002 Palpitations: Secondary | ICD-10-CM | POA: Diagnosis not present

## 2018-10-08 DIAGNOSIS — Z1389 Encounter for screening for other disorder: Secondary | ICD-10-CM | POA: Diagnosis not present

## 2018-10-08 DIAGNOSIS — K219 Gastro-esophageal reflux disease without esophagitis: Secondary | ICD-10-CM | POA: Diagnosis not present

## 2018-10-08 DIAGNOSIS — F419 Anxiety disorder, unspecified: Secondary | ICD-10-CM | POA: Diagnosis not present

## 2018-10-08 DIAGNOSIS — G894 Chronic pain syndrome: Secondary | ICD-10-CM | POA: Diagnosis not present

## 2018-10-08 DIAGNOSIS — I700 Atherosclerosis of aorta: Secondary | ICD-10-CM | POA: Diagnosis not present

## 2018-10-08 DIAGNOSIS — Z6827 Body mass index (BMI) 27.0-27.9, adult: Secondary | ICD-10-CM | POA: Diagnosis not present

## 2018-10-08 DIAGNOSIS — Z0001 Encounter for general adult medical examination with abnormal findings: Secondary | ICD-10-CM | POA: Diagnosis not present

## 2018-10-15 DIAGNOSIS — Z6827 Body mass index (BMI) 27.0-27.9, adult: Secondary | ICD-10-CM | POA: Diagnosis not present

## 2018-10-15 DIAGNOSIS — Z1389 Encounter for screening for other disorder: Secondary | ICD-10-CM | POA: Diagnosis not present

## 2018-10-15 DIAGNOSIS — Z0000 Encounter for general adult medical examination without abnormal findings: Secondary | ICD-10-CM | POA: Diagnosis not present

## 2018-10-15 DIAGNOSIS — E663 Overweight: Secondary | ICD-10-CM | POA: Diagnosis not present

## 2018-11-05 DIAGNOSIS — Z6828 Body mass index (BMI) 28.0-28.9, adult: Secondary | ICD-10-CM | POA: Diagnosis not present

## 2018-11-05 DIAGNOSIS — G894 Chronic pain syndrome: Secondary | ICD-10-CM | POA: Diagnosis not present

## 2018-11-05 DIAGNOSIS — E663 Overweight: Secondary | ICD-10-CM | POA: Diagnosis not present

## 2018-11-05 DIAGNOSIS — R05 Cough: Secondary | ICD-10-CM | POA: Diagnosis not present

## 2018-11-05 DIAGNOSIS — K219 Gastro-esophageal reflux disease without esophagitis: Secondary | ICD-10-CM | POA: Diagnosis not present

## 2018-11-05 DIAGNOSIS — J45909 Unspecified asthma, uncomplicated: Secondary | ICD-10-CM | POA: Diagnosis not present

## 2018-11-28 DIAGNOSIS — I700 Atherosclerosis of aorta: Secondary | ICD-10-CM | POA: Diagnosis not present

## 2018-11-28 DIAGNOSIS — J029 Acute pharyngitis, unspecified: Secondary | ICD-10-CM | POA: Diagnosis not present

## 2018-11-28 DIAGNOSIS — Z6828 Body mass index (BMI) 28.0-28.9, adult: Secondary | ICD-10-CM | POA: Diagnosis not present

## 2018-11-28 DIAGNOSIS — G894 Chronic pain syndrome: Secondary | ICD-10-CM | POA: Diagnosis not present

## 2019-01-02 DIAGNOSIS — G894 Chronic pain syndrome: Secondary | ICD-10-CM | POA: Diagnosis not present

## 2019-01-02 DIAGNOSIS — Z681 Body mass index (BMI) 19 or less, adult: Secondary | ICD-10-CM | POA: Diagnosis not present

## 2019-01-02 DIAGNOSIS — H8113 Benign paroxysmal vertigo, bilateral: Secondary | ICD-10-CM | POA: Diagnosis not present

## 2019-02-06 ENCOUNTER — Other Ambulatory Visit (HOSPITAL_COMMUNITY): Payer: Self-pay | Admitting: Internal Medicine

## 2019-02-06 DIAGNOSIS — Z1231 Encounter for screening mammogram for malignant neoplasm of breast: Secondary | ICD-10-CM

## 2019-02-12 ENCOUNTER — Ambulatory Visit (HOSPITAL_COMMUNITY): Payer: Medicare HMO

## 2019-02-18 DIAGNOSIS — H8113 Benign paroxysmal vertigo, bilateral: Secondary | ICD-10-CM | POA: Diagnosis not present

## 2019-02-18 DIAGNOSIS — G43909 Migraine, unspecified, not intractable, without status migrainosus: Secondary | ICD-10-CM | POA: Diagnosis not present

## 2019-02-18 DIAGNOSIS — Z6829 Body mass index (BMI) 29.0-29.9, adult: Secondary | ICD-10-CM | POA: Diagnosis not present

## 2019-02-18 DIAGNOSIS — R05 Cough: Secondary | ICD-10-CM | POA: Diagnosis not present

## 2019-02-18 DIAGNOSIS — Z1389 Encounter for screening for other disorder: Secondary | ICD-10-CM | POA: Diagnosis not present

## 2019-02-21 ENCOUNTER — Other Ambulatory Visit: Payer: Self-pay | Admitting: Internal Medicine

## 2019-02-21 ENCOUNTER — Other Ambulatory Visit (HOSPITAL_COMMUNITY): Payer: Self-pay | Admitting: Internal Medicine

## 2019-02-21 DIAGNOSIS — R42 Dizziness and giddiness: Secondary | ICD-10-CM

## 2019-03-10 ENCOUNTER — Ambulatory Visit (HOSPITAL_COMMUNITY)
Admission: RE | Admit: 2019-03-10 | Discharge: 2019-03-10 | Disposition: A | Discharge status: Home / Self Care | Payer: Medicare HMO | Source: Ambulatory Visit | Attending: Internal Medicine | Admitting: Internal Medicine

## 2019-03-10 ENCOUNTER — Other Ambulatory Visit (HOSPITAL_COMMUNITY): Payer: Self-pay | Admitting: Internal Medicine

## 2019-03-10 ENCOUNTER — Other Ambulatory Visit: Payer: Self-pay

## 2019-03-10 DIAGNOSIS — R05 Cough: Secondary | ICD-10-CM | POA: Insufficient documentation

## 2019-03-10 DIAGNOSIS — R42 Dizziness and giddiness: Secondary | ICD-10-CM | POA: Insufficient documentation

## 2019-03-10 DIAGNOSIS — R51 Headache: Secondary | ICD-10-CM | POA: Diagnosis not present

## 2019-03-10 DIAGNOSIS — M25531 Pain in right wrist: Secondary | ICD-10-CM | POA: Diagnosis not present

## 2019-03-10 DIAGNOSIS — R059 Cough, unspecified: Secondary | ICD-10-CM

## 2019-03-10 DIAGNOSIS — S6991XA Unspecified injury of right wrist, hand and finger(s), initial encounter: Secondary | ICD-10-CM | POA: Diagnosis not present

## 2019-03-10 DIAGNOSIS — J449 Chronic obstructive pulmonary disease, unspecified: Secondary | ICD-10-CM | POA: Diagnosis not present

## 2019-03-10 DIAGNOSIS — Z87891 Personal history of nicotine dependence: Secondary | ICD-10-CM | POA: Diagnosis not present

## 2019-03-10 DIAGNOSIS — J45909 Unspecified asthma, uncomplicated: Secondary | ICD-10-CM | POA: Diagnosis not present

## 2019-04-01 DIAGNOSIS — Z681 Body mass index (BMI) 19 or less, adult: Secondary | ICD-10-CM | POA: Diagnosis not present

## 2019-04-01 DIAGNOSIS — G894 Chronic pain syndrome: Secondary | ICD-10-CM | POA: Diagnosis not present

## 2019-04-01 DIAGNOSIS — S8001XA Contusion of right knee, initial encounter: Secondary | ICD-10-CM | POA: Diagnosis not present

## 2019-05-01 DIAGNOSIS — G894 Chronic pain syndrome: Secondary | ICD-10-CM | POA: Diagnosis not present

## 2019-05-01 DIAGNOSIS — M159 Polyosteoarthritis, unspecified: Secondary | ICD-10-CM | POA: Diagnosis not present

## 2019-05-01 DIAGNOSIS — M5030 Other cervical disc degeneration, unspecified cervical region: Secondary | ICD-10-CM | POA: Diagnosis not present

## 2019-05-30 DIAGNOSIS — M1991 Primary osteoarthritis, unspecified site: Secondary | ICD-10-CM | POA: Diagnosis not present

## 2019-05-30 DIAGNOSIS — G894 Chronic pain syndrome: Secondary | ICD-10-CM | POA: Diagnosis not present

## 2019-05-30 DIAGNOSIS — M2550 Pain in unspecified joint: Secondary | ICD-10-CM | POA: Diagnosis not present

## 2019-05-30 DIAGNOSIS — K219 Gastro-esophageal reflux disease without esophagitis: Secondary | ICD-10-CM | POA: Diagnosis not present

## 2019-05-30 DIAGNOSIS — Z6828 Body mass index (BMI) 28.0-28.9, adult: Secondary | ICD-10-CM | POA: Diagnosis not present

## 2019-06-20 DIAGNOSIS — M2550 Pain in unspecified joint: Secondary | ICD-10-CM | POA: Diagnosis not present

## 2019-07-02 DIAGNOSIS — M5030 Other cervical disc degeneration, unspecified cervical region: Secondary | ICD-10-CM | POA: Diagnosis not present

## 2019-07-02 DIAGNOSIS — G894 Chronic pain syndrome: Secondary | ICD-10-CM | POA: Diagnosis not present

## 2019-08-01 DIAGNOSIS — M1991 Primary osteoarthritis, unspecified site: Secondary | ICD-10-CM | POA: Diagnosis not present

## 2019-08-01 DIAGNOSIS — G894 Chronic pain syndrome: Secondary | ICD-10-CM | POA: Diagnosis not present

## 2019-08-01 DIAGNOSIS — Z6827 Body mass index (BMI) 27.0-27.9, adult: Secondary | ICD-10-CM | POA: Diagnosis not present

## 2019-08-01 DIAGNOSIS — Z23 Encounter for immunization: Secondary | ICD-10-CM | POA: Diagnosis not present

## 2019-08-01 DIAGNOSIS — S83411A Sprain of medial collateral ligament of right knee, initial encounter: Secondary | ICD-10-CM | POA: Diagnosis not present

## 2019-08-25 DIAGNOSIS — J329 Chronic sinusitis, unspecified: Secondary | ICD-10-CM | POA: Diagnosis not present

## 2019-08-25 DIAGNOSIS — Z20828 Contact with and (suspected) exposure to other viral communicable diseases: Secondary | ICD-10-CM | POA: Diagnosis not present

## 2019-08-27 ENCOUNTER — Other Ambulatory Visit: Payer: Self-pay

## 2019-08-27 ENCOUNTER — Ambulatory Visit: Payer: Medicare HMO | Attending: Internal Medicine

## 2019-08-27 DIAGNOSIS — Z20822 Contact with and (suspected) exposure to covid-19: Secondary | ICD-10-CM

## 2019-08-27 DIAGNOSIS — Z20828 Contact with and (suspected) exposure to other viral communicable diseases: Secondary | ICD-10-CM | POA: Diagnosis not present

## 2019-08-28 LAB — NOVEL CORONAVIRUS, NAA: SARS-CoV-2, NAA: NOT DETECTED

## 2019-09-08 DIAGNOSIS — G894 Chronic pain syndrome: Secondary | ICD-10-CM | POA: Diagnosis not present

## 2019-10-22 DIAGNOSIS — G894 Chronic pain syndrome: Secondary | ICD-10-CM | POA: Diagnosis not present

## 2019-10-22 DIAGNOSIS — K5909 Other constipation: Secondary | ICD-10-CM | POA: Diagnosis not present

## 2019-10-22 DIAGNOSIS — M1991 Primary osteoarthritis, unspecified site: Secondary | ICD-10-CM | POA: Diagnosis not present

## 2019-12-08 DIAGNOSIS — R1032 Left lower quadrant pain: Secondary | ICD-10-CM | POA: Diagnosis not present

## 2019-12-08 DIAGNOSIS — Z0000 Encounter for general adult medical examination without abnormal findings: Secondary | ICD-10-CM | POA: Diagnosis not present

## 2019-12-08 DIAGNOSIS — M2550 Pain in unspecified joint: Secondary | ICD-10-CM | POA: Diagnosis not present

## 2019-12-08 DIAGNOSIS — M353 Polymyalgia rheumatica: Secondary | ICD-10-CM | POA: Diagnosis not present

## 2019-12-08 DIAGNOSIS — Z6828 Body mass index (BMI) 28.0-28.9, adult: Secondary | ICD-10-CM | POA: Diagnosis not present

## 2019-12-08 DIAGNOSIS — Z1389 Encounter for screening for other disorder: Secondary | ICD-10-CM | POA: Diagnosis not present

## 2019-12-08 DIAGNOSIS — M1991 Primary osteoarthritis, unspecified site: Secondary | ICD-10-CM | POA: Diagnosis not present

## 2019-12-08 DIAGNOSIS — I700 Atherosclerosis of aorta: Secondary | ICD-10-CM | POA: Diagnosis not present

## 2019-12-10 DIAGNOSIS — Z87891 Personal history of nicotine dependence: Secondary | ICD-10-CM | POA: Diagnosis not present

## 2019-12-10 DIAGNOSIS — M158 Other polyosteoarthritis: Secondary | ICD-10-CM | POA: Diagnosis not present

## 2019-12-10 DIAGNOSIS — J45909 Unspecified asthma, uncomplicated: Secondary | ICD-10-CM | POA: Diagnosis not present

## 2019-12-10 DIAGNOSIS — F1120 Opioid dependence, uncomplicated: Secondary | ICD-10-CM | POA: Diagnosis not present

## 2019-12-16 DIAGNOSIS — Z0000 Encounter for general adult medical examination without abnormal findings: Secondary | ICD-10-CM | POA: Diagnosis not present

## 2019-12-16 DIAGNOSIS — Z1389 Encounter for screening for other disorder: Secondary | ICD-10-CM | POA: Diagnosis not present

## 2019-12-16 DIAGNOSIS — R5383 Other fatigue: Secondary | ICD-10-CM | POA: Diagnosis not present

## 2019-12-16 DIAGNOSIS — E538 Deficiency of other specified B group vitamins: Secondary | ICD-10-CM | POA: Diagnosis not present

## 2019-12-16 DIAGNOSIS — Z6828 Body mass index (BMI) 28.0-28.9, adult: Secondary | ICD-10-CM | POA: Diagnosis not present

## 2019-12-16 DIAGNOSIS — Z0001 Encounter for general adult medical examination with abnormal findings: Secondary | ICD-10-CM | POA: Diagnosis not present

## 2019-12-16 DIAGNOSIS — E559 Vitamin D deficiency, unspecified: Secondary | ICD-10-CM | POA: Diagnosis not present

## 2019-12-16 DIAGNOSIS — E663 Overweight: Secondary | ICD-10-CM | POA: Diagnosis not present

## 2020-01-09 DIAGNOSIS — M158 Other polyosteoarthritis: Secondary | ICD-10-CM | POA: Diagnosis not present

## 2020-01-09 DIAGNOSIS — J45909 Unspecified asthma, uncomplicated: Secondary | ICD-10-CM | POA: Diagnosis not present

## 2020-01-09 DIAGNOSIS — Z87891 Personal history of nicotine dependence: Secondary | ICD-10-CM | POA: Diagnosis not present

## 2020-01-09 DIAGNOSIS — F1120 Opioid dependence, uncomplicated: Secondary | ICD-10-CM | POA: Diagnosis not present

## 2020-01-13 DIAGNOSIS — G894 Chronic pain syndrome: Secondary | ICD-10-CM | POA: Diagnosis not present

## 2020-01-13 DIAGNOSIS — G43909 Migraine, unspecified, not intractable, without status migrainosus: Secondary | ICD-10-CM | POA: Diagnosis not present

## 2020-01-13 DIAGNOSIS — E538 Deficiency of other specified B group vitamins: Secondary | ICD-10-CM | POA: Diagnosis not present

## 2020-02-09 DIAGNOSIS — J45909 Unspecified asthma, uncomplicated: Secondary | ICD-10-CM | POA: Diagnosis not present

## 2020-02-09 DIAGNOSIS — M158 Other polyosteoarthritis: Secondary | ICD-10-CM | POA: Diagnosis not present

## 2020-02-09 DIAGNOSIS — Z87891 Personal history of nicotine dependence: Secondary | ICD-10-CM | POA: Diagnosis not present

## 2020-02-09 DIAGNOSIS — F1120 Opioid dependence, uncomplicated: Secondary | ICD-10-CM | POA: Diagnosis not present

## 2020-02-10 DIAGNOSIS — G894 Chronic pain syndrome: Secondary | ICD-10-CM | POA: Diagnosis not present

## 2020-03-01 DIAGNOSIS — Z6828 Body mass index (BMI) 28.0-28.9, adult: Secondary | ICD-10-CM | POA: Diagnosis not present

## 2020-03-01 DIAGNOSIS — M1991 Primary osteoarthritis, unspecified site: Secondary | ICD-10-CM | POA: Diagnosis not present

## 2020-03-01 DIAGNOSIS — E538 Deficiency of other specified B group vitamins: Secondary | ICD-10-CM | POA: Diagnosis not present

## 2020-03-01 DIAGNOSIS — D485 Neoplasm of uncertain behavior of skin: Secondary | ICD-10-CM | POA: Diagnosis not present

## 2020-03-01 DIAGNOSIS — L918 Other hypertrophic disorders of the skin: Secondary | ICD-10-CM | POA: Diagnosis not present

## 2020-03-01 DIAGNOSIS — G894 Chronic pain syndrome: Secondary | ICD-10-CM | POA: Diagnosis not present

## 2020-03-08 ENCOUNTER — Other Ambulatory Visit (HOSPITAL_COMMUNITY): Payer: Self-pay | Admitting: Internal Medicine

## 2020-03-08 DIAGNOSIS — Z1231 Encounter for screening mammogram for malignant neoplasm of breast: Secondary | ICD-10-CM

## 2020-03-10 DIAGNOSIS — D2239 Melanocytic nevi of other parts of face: Secondary | ICD-10-CM | POA: Diagnosis not present

## 2020-03-10 DIAGNOSIS — D485 Neoplasm of uncertain behavior of skin: Secondary | ICD-10-CM | POA: Diagnosis not present

## 2020-03-10 DIAGNOSIS — D239 Other benign neoplasm of skin, unspecified: Secondary | ICD-10-CM | POA: Diagnosis not present

## 2020-03-18 ENCOUNTER — Other Ambulatory Visit: Payer: Self-pay

## 2020-03-18 ENCOUNTER — Ambulatory Visit (HOSPITAL_COMMUNITY)
Admission: RE | Admit: 2020-03-18 | Discharge: 2020-03-18 | Disposition: A | Discharge status: Home / Self Care | Payer: Medicare HMO | Source: Ambulatory Visit | Attending: Internal Medicine | Admitting: Internal Medicine

## 2020-03-18 DIAGNOSIS — Z1231 Encounter for screening mammogram for malignant neoplasm of breast: Secondary | ICD-10-CM | POA: Diagnosis not present

## 2020-03-19 DIAGNOSIS — G43909 Migraine, unspecified, not intractable, without status migrainosus: Secondary | ICD-10-CM | POA: Diagnosis not present

## 2020-03-19 DIAGNOSIS — G894 Chronic pain syndrome: Secondary | ICD-10-CM | POA: Diagnosis not present

## 2020-03-19 DIAGNOSIS — K219 Gastro-esophageal reflux disease without esophagitis: Secondary | ICD-10-CM | POA: Diagnosis not present

## 2020-03-19 DIAGNOSIS — M1991 Primary osteoarthritis, unspecified site: Secondary | ICD-10-CM | POA: Diagnosis not present

## 2020-04-09 DIAGNOSIS — F1120 Opioid dependence, uncomplicated: Secondary | ICD-10-CM | POA: Diagnosis not present

## 2020-04-09 DIAGNOSIS — M158 Other polyosteoarthritis: Secondary | ICD-10-CM | POA: Diagnosis not present

## 2020-04-09 DIAGNOSIS — Z87891 Personal history of nicotine dependence: Secondary | ICD-10-CM | POA: Diagnosis not present

## 2020-04-09 DIAGNOSIS — J45909 Unspecified asthma, uncomplicated: Secondary | ICD-10-CM | POA: Diagnosis not present

## 2020-04-15 DIAGNOSIS — Z681 Body mass index (BMI) 19 or less, adult: Secondary | ICD-10-CM | POA: Diagnosis not present

## 2020-04-15 DIAGNOSIS — J329 Chronic sinusitis, unspecified: Secondary | ICD-10-CM | POA: Diagnosis not present

## 2020-04-15 DIAGNOSIS — G894 Chronic pain syndrome: Secondary | ICD-10-CM | POA: Diagnosis not present

## 2020-05-21 ENCOUNTER — Other Ambulatory Visit (HOSPITAL_COMMUNITY): Payer: Self-pay | Admitting: Internal Medicine

## 2020-05-21 DIAGNOSIS — G43909 Migraine, unspecified, not intractable, without status migrainosus: Secondary | ICD-10-CM | POA: Diagnosis not present

## 2020-05-21 DIAGNOSIS — G894 Chronic pain syndrome: Secondary | ICD-10-CM | POA: Diagnosis not present

## 2020-05-21 DIAGNOSIS — F419 Anxiety disorder, unspecified: Secondary | ICD-10-CM | POA: Diagnosis not present

## 2020-05-21 DIAGNOSIS — R0600 Dyspnea, unspecified: Secondary | ICD-10-CM

## 2020-06-10 DIAGNOSIS — Z87891 Personal history of nicotine dependence: Secondary | ICD-10-CM | POA: Diagnosis not present

## 2020-06-10 DIAGNOSIS — J45909 Unspecified asthma, uncomplicated: Secondary | ICD-10-CM | POA: Diagnosis not present

## 2020-06-10 DIAGNOSIS — F1120 Opioid dependence, uncomplicated: Secondary | ICD-10-CM | POA: Diagnosis not present

## 2020-06-10 DIAGNOSIS — M158 Other polyosteoarthritis: Secondary | ICD-10-CM | POA: Diagnosis not present

## 2020-06-21 DIAGNOSIS — Z6828 Body mass index (BMI) 28.0-28.9, adult: Secondary | ICD-10-CM | POA: Diagnosis not present

## 2020-06-21 DIAGNOSIS — G894 Chronic pain syndrome: Secondary | ICD-10-CM | POA: Diagnosis not present

## 2020-06-21 DIAGNOSIS — E663 Overweight: Secondary | ICD-10-CM | POA: Diagnosis not present

## 2020-06-21 DIAGNOSIS — M1711 Unilateral primary osteoarthritis, right knee: Secondary | ICD-10-CM | POA: Diagnosis not present

## 2020-07-10 DIAGNOSIS — F1120 Opioid dependence, uncomplicated: Secondary | ICD-10-CM | POA: Diagnosis not present

## 2020-07-10 DIAGNOSIS — Z87891 Personal history of nicotine dependence: Secondary | ICD-10-CM | POA: Diagnosis not present

## 2020-07-10 DIAGNOSIS — M158 Other polyosteoarthritis: Secondary | ICD-10-CM | POA: Diagnosis not present

## 2020-07-10 DIAGNOSIS — J45909 Unspecified asthma, uncomplicated: Secondary | ICD-10-CM | POA: Diagnosis not present

## 2020-07-21 DIAGNOSIS — G894 Chronic pain syndrome: Secondary | ICD-10-CM | POA: Diagnosis not present

## 2020-07-21 DIAGNOSIS — J329 Chronic sinusitis, unspecified: Secondary | ICD-10-CM | POA: Diagnosis not present

## 2020-07-21 DIAGNOSIS — Z681 Body mass index (BMI) 19 or less, adult: Secondary | ICD-10-CM | POA: Diagnosis not present

## 2020-07-21 DIAGNOSIS — G43909 Migraine, unspecified, not intractable, without status migrainosus: Secondary | ICD-10-CM | POA: Diagnosis not present

## 2020-08-10 DIAGNOSIS — M2341 Loose body in knee, right knee: Secondary | ICD-10-CM | POA: Diagnosis not present

## 2020-08-19 ENCOUNTER — Encounter: Payer: Self-pay | Admitting: *Deleted

## 2020-08-20 ENCOUNTER — Encounter: Payer: Self-pay | Admitting: Cardiology

## 2020-08-20 ENCOUNTER — Ambulatory Visit: Payer: Medicare HMO | Admitting: Cardiology

## 2020-08-20 DIAGNOSIS — G894 Chronic pain syndrome: Secondary | ICD-10-CM | POA: Diagnosis not present

## 2020-08-20 NOTE — Progress Notes (Deleted)
Cardiology Office Note  Date: 08/20/2020   ID: Gina Wolfe, DOB 1956/06/26, MRN [id number]  PCP:  Redmond School, MD  Cardiologist:  Rozann Lesches, MD Electrophysiologist:  None   No chief complaint on file.   History of Present Illness: Gina Wolfe is a 64 y.o. female referred for cardiology consultation by Dr. Gerarda Fraction for evaluation of chest pain.  She was seen in cardiology consultation back in 2015 for evaluation of chest pain at which point she underwent an echocardiogram and a Lexiscan Myoview, both of which were reassuring as detailed below.  Past Medical History:  Diagnosis Date  . Anxiety   . Arthritis   . Asthma   . Chronic pain syndrome   . COPD (chronic obstructive pulmonary disease) ([place])   . Diverticulitis   . GERD (gastroesophageal reflux disease)   . Heart murmur    As child  . IBS (irritable bowel syndrome)   . Spinal headache     Past Surgical History:  Procedure Laterality Date  . ABDOMINAL HYSTERECTOMY    . BACK SURGERY     x3 previous lumbar, x3 cervical surgeries  . CHOLECYSTECTOMY    . COLONOSCOPY  05/20/10   FOY:DXAJOINO hemorrhoids otherwise normal/EGD:distal esophageal erosions otherwise normal  . CYST EXCISION Right    Eye cyst  . EYE SURGERY Right    removed a cyst  . LUMBAR LAMINECTOMY/DECOMPRESSION MICRODISCECTOMY N/A 07/13/2016   Procedure: DISCECTOMY Lumbar four-five;  Surgeon: Newman Pies, MD;  Location: [facility];  Service: Neurosurgery;  Laterality: N/A;  . MENISCUS REPAIR  2007   R knee   . POSTERIOR CERVICAL FUSION/FORAMINOTOMY N/A 04/10/2013   Procedure: POSTERIOR CERVICAL FUSION/FORAMINOTOMY LEVEL 2;  Surgeon: Sinclair Ship, MD;  Location: [facility];  Service: Orthopedics;  Laterality: N/A;  Posterior cervical decompression fusion, cervical 5-6, cervical 6-7 with instrumentation and allograft.  . TUBAL LIGATION      Current Outpatient Medications  Medication Sig Dispense Refill  . albuterol (PROVENTIL HFA;VENTOLIN  HFA) 108 (90 Base) MCG/ACT inhaler Inhale 2 puffs into the lungs every 6 (six) hours as needed for wheezing or shortness of breath.     . ALPRAZolam (XANAX) 1 MG tablet Take 1 mg by mouth 4 (four) times daily as needed for anxiety.     . cyclobenzaprine (FLEXERIL) 10 MG tablet Take 10 mg by mouth 2 (two) times daily as needed for muscle spasms.    Marland Kitchen estrogens, conjugated, (PREMARIN) 0.625 MG tablet Take 0.625 mg by mouth daily with breakfast.     . furosemide (LASIX) 20 MG tablet Take 20 mg by mouth daily as needed for edema.     Marland Kitchen HYDROmorphone (DILAUDID) 2 MG tablet Take 1 tablet (2 mg total) by mouth every 4 (four) hours as needed for severe pain (discontine oxycodone while on this med!!!). 20 tablet 0  . linaclotide (LINZESS) 145 MCG CAPS capsule Take 145 mcg by mouth daily as needed (constipation).    . meloxicam (MOBIC) 7.5 MG tablet Take 7.5 mg by mouth 2 (two) times daily.     . promethazine (PHENERGAN) 25 MG tablet Take 25 mg by mouth 2 (two) times daily as needed for nausea or vomiting.    . promethazine (PHENERGAN) 25 MG tablet Take 0.5 tablets (12.5 mg total) by mouth every 6 (six) hours as needed for nausea or vomiting. 20 tablet 1   No current facility-administered medications for this visit.   Allergies:  Clindamycin hcl and Codeine   Social History: The  patient  reports that she quit smoking about 12 years ago. Her smoking use included cigarettes. She has never used smokeless tobacco. She reports that she does not drink alcohol and does not use drugs.   Family History: The patient's family history includes Cancer in her father; Diabetes in an other family member; Emphysema in her mother; Hypertension in her mother and another family member.   ROS:  Please see the history of present illness. Otherwise, complete review of systems is positive for {NONE DEFAULTED:18576::"none"}.  All other systems are reviewed and negative.   Physical Exam: VS:  There were no vitals taken for this  visit., BMI There is no height or weight on file to calculate BMI.  Wt Readings from Last 3 Encounters:  01/17/18 141 lb 11.2 oz (64.3 kg)  01/08/18 141 lb 11.2 oz (64.3 kg)  07/13/16 147 lb (66.7 kg)    General: Patient appears comfortable at rest. HEENT: Conjunctiva and lids normal, oropharynx clear with moist mucosa. Neck: Supple, no elevated JVP or carotid bruits, no thyromegaly. Lungs: Clear to auscultation, nonlabored breathing at rest. Cardiac: Regular rate and rhythm, no S3 or significant systolic murmur, no pericardial rub. Abdomen: Soft, nontender, no hepatomegaly, bowel sounds present, no guarding or rebound. Extremities: No pitting edema, distal pulses 2+. Skin: Warm and dry. Musculoskeletal: No kyphosis. Neuropsychiatric: Alert and oriented x3, affect grossly appropriate.  ECG:  An ECG dated 08/24/2014 was personally reviewed today and demonstrated:  Normal sinus rhythm with low voltage.  Recent Labwork:  April 2019: Potassium 3.6, BUN 9, creatinine 0.61, hemoglobin 13.6, platelets 291  Other Studies Reviewed Today:  Echocardiogram 08/25/2014: - Left ventricle: The cavity size was normal. Wall thickness was  increased in a pattern of mild LVH. Systolic function was normal.  The estimated ejection fraction was in the range of 60% to 65%.  Wall motion was normal; there were no regional wall motion  abnormalities. Findings consistent with left ventricular  diastolic dysfunction. There was no evidence of elevated  ventricular filling pressure by Doppler parameters.   Lexiscan Myoview 08/25/2014: IMPRESSION: 1. No reversible ischemia or infarction.  2. Normal left ventricular wall motion.  3. Left ventricular ejection fraction 55%  4. Low-risk stress test findings*.  Assessment and Plan:    Medication Adjustments/Labs and Tests Ordered: Current medicines are reviewed at length with the patient today.  Concerns regarding medicines are outlined  above.   Tests Ordered: No orders of the defined types were placed in this encounter.   Medication Changes: No orders of the defined types were placed in this encounter.   Disposition:  Follow up {follow up:15908}  Signed, Satira Sark, MD, [facility] 08/20/2020 8:23 AM    [facility] [place], [place], [place] [address] Phone: [phone]; Fax: [phone]

## 2020-09-02 DIAGNOSIS — M25561 Pain in right knee: Secondary | ICD-10-CM | POA: Diagnosis not present

## 2020-09-09 DIAGNOSIS — M5030 Other cervical disc degeneration, unspecified cervical region: Secondary | ICD-10-CM | POA: Diagnosis not present

## 2020-09-09 DIAGNOSIS — Z6828 Body mass index (BMI) 28.0-28.9, adult: Secondary | ICD-10-CM | POA: Diagnosis not present

## 2020-09-09 DIAGNOSIS — R42 Dizziness and giddiness: Secondary | ICD-10-CM | POA: Diagnosis not present

## 2020-09-09 DIAGNOSIS — E663 Overweight: Secondary | ICD-10-CM | POA: Diagnosis not present

## 2020-09-14 DIAGNOSIS — M25561 Pain in right knee: Secondary | ICD-10-CM | POA: Diagnosis not present

## 2020-09-14 DIAGNOSIS — M1711 Unilateral primary osteoarthritis, right knee: Secondary | ICD-10-CM | POA: Diagnosis not present

## 2020-09-16 DIAGNOSIS — N951 Menopausal and female climacteric states: Secondary | ICD-10-CM | POA: Diagnosis not present

## 2020-09-16 DIAGNOSIS — G894 Chronic pain syndrome: Secondary | ICD-10-CM | POA: Diagnosis not present

## 2020-09-16 DIAGNOSIS — R42 Dizziness and giddiness: Secondary | ICD-10-CM | POA: Diagnosis not present

## 2020-09-16 DIAGNOSIS — M5030 Other cervical disc degeneration, unspecified cervical region: Secondary | ICD-10-CM | POA: Diagnosis not present

## 2020-10-08 ENCOUNTER — Other Ambulatory Visit: Payer: Medicare HMO

## 2020-10-08 DIAGNOSIS — Z20822 Contact with and (suspected) exposure to covid-19: Secondary | ICD-10-CM

## 2020-10-09 LAB — SARS-COV-2, NAA 2 DAY TAT

## 2020-10-09 LAB — NOVEL CORONAVIRUS, NAA: SARS-CoV-2, NAA: DETECTED — AB

## 2020-10-10 ENCOUNTER — Other Ambulatory Visit: Payer: Self-pay | Admitting: Physician Assistant

## 2020-10-10 DIAGNOSIS — E663 Overweight: Secondary | ICD-10-CM

## 2020-10-10 DIAGNOSIS — U071 COVID-19: Secondary | ICD-10-CM

## 2020-10-10 MED ORDER — MOLNUPIRAVIR EUA 200MG CAPSULE: 4.0000 | ORAL_CAPSULE | Freq: Two times a day (BID) | ORAL | 0 refills | Status: AC

## 2020-10-10 NOTE — Progress Notes (Signed)
Outpatient Oral COVID Treatment Note  I connected with Gina Wolfe on 10/10/2020/2:59 PM by telephone and verified that I am speaking with the correct person using two identifiers.  I discussed the limitations, risks, security, and privacy concerns of performing an evaluation and management service by telephone and the availability of in person appointments. I also discussed with the patient that there may be a patient responsible charge related to this service. The patient expressed understanding and agreed to proceed.  Patient location: Home Provider location: Home  Diagnosis: COVID-19 infection  Purpose of visit: Discussion of potential use of Molnupiravir or Paxlovid, a new treatment for mild to moderate COVID-19 viral infection in non-hospitalized patients.   Subjective: Patient is a 65 y.o. female who has been diagnosed with COVID 19 viral infection.  Their symptoms began on 10/08/19 with cough, congestion and fever.   Past Medical History:  Diagnosis Date  . Anxiety   . Arthritis   . Asthma   . Chronic pain syndrome   . COPD (chronic obstructive pulmonary disease) ([place])   . Diverticulitis   . GERD (gastroesophageal reflux disease)   . Heart murmur    As child  . IBS (irritable bowel syndrome)   . Spinal headache     Allergies  Allergen Reactions  . Clindamycin Hcl Anaphylaxis  . Codeine Nausea Only     Current Outpatient Medications:  .  albuterol (PROVENTIL HFA;VENTOLIN HFA) 108 (90 Base) MCG/ACT inhaler, Inhale 2 puffs into the lungs every 6 (six) hours as needed for wheezing or shortness of breath. , Disp: , Rfl:  .  ALPRAZolam (XANAX) 1 MG tablet, Take 1 mg by mouth 4 (four) times daily as needed for anxiety. , Disp: , Rfl:  .  cyclobenzaprine (FLEXERIL) 10 MG tablet, Take 10 mg by mouth 2 (two) times daily as needed for muscle spasms., Disp: , Rfl:  .  estrogens, conjugated, (PREMARIN) 0.625 MG tablet, Take 0.625 mg by mouth daily with breakfast. , Disp: , Rfl:  .   furosemide (LASIX) 20 MG tablet, Take 20 mg by mouth daily as needed for edema. , Disp: , Rfl:  .  HYDROmorphone (DILAUDID) 2 MG tablet, Take 1 tablet (2 mg total) by mouth every 4 (four) hours as needed for severe pain (discontine oxycodone while on this med!!!)., Disp: 20 tablet, Rfl: 0 .  linaclotide (LINZESS) 145 MCG CAPS capsule, Take 145 mcg by mouth daily as needed (constipation)., Disp: , Rfl:  .  meloxicam (MOBIC) 7.5 MG tablet, Take 7.5 mg by mouth 2 (two) times daily. , Disp: , Rfl:  .  promethazine (PHENERGAN) 25 MG tablet, Take 25 mg by mouth 2 (two) times daily as needed for nausea or vomiting., Disp: , Rfl:  .  promethazine (PHENERGAN) 25 MG tablet, Take 0.5 tablets (12.5 mg total) by mouth every 6 (six) hours as needed for nausea or vomiting., Disp: 20 tablet, Rfl: 1  Objective: Patient appears/sounds Sick.  They are in no apparent distress.  Breathing is non labored.  Mood and behavior are normal.  Laboratory Data:  Recent Results (from the past 2160 hour(s))  Novel Coronavirus, NAA ([facility])     Status: Abnormal   Collection Time: 10/08/20 12:48 PM   Specimen: Nasopharyngeal(NP) swabs in vial transport medium   Nasopharynge  Result Value Ref Range   SARS-CoV-2, NAA Detected (A) Not Detected    Comment: Patients who have a positive COVID-19 test result may now have treatment options. Treatment options are available for patients  with mild to moderate symptoms and for hospitalized patients. Visit our website at [email or web address] for resources and information. This nucleic acid amplification test was developed and its performance characteristics determined by [employer]. Nucleic acid amplification tests include RT-PCR and TMA. This test has not been FDA cleared or approved. This test has been authorized by FDA under an Emergency Use Authorization (EUA). This test is only authorized for the duration of time the declaration that circumstances  exist justifying the authorization of the emergency use of in vitro diagnostic tests for detection of SARS-CoV-2 virus and/or diagnosis of COVID-19 infection under section 564(b)(1) of the Act, 21 U.S.C. 161WRU-0(A) (1), unless the authorization is terminated or revoked sooner. When diagnostic testing is negativ e, the possibility of a false negative result should be considered in the context of a patient's recent exposures and the presence of clinical signs and symptoms consistent with COVID-19. An individual without symptoms of COVID-19 and who is not shedding SARS-CoV-2 virus would expect to have a negative (not detected) result in this assay.   SARS-COV-2, NAA 2 DAY TAT     Status: None   Collection Time: 10/08/20 12:48 PM   Nasopharynge  Result Value Ref Range   SARS-CoV-2, NAA 2 DAY TAT Performed      Assessment: 65 y.o. female with mild/moderate COVID 19 viral infection diagnosed on 1/28 at high risk for progression to severe COVID 19.  Plan:  This patient is a 65 y.o. female that meets the following criteria for Emergency Use Authorization of: Molnupiravir  1. Age >18 yr 2. SARS-COV-2 positive test 3. Symptom onset < 5 days 4. Mild-to-moderate COVID disease with high risk for severe progression to hospitalization or death   I have spoken and communicated the following to the patient or parent/caregiver regarding: 1. Molnupiravir is an unapproved drug that is authorized for use under an [employer] production planner.  2. There are no adequate, approved, available products for the treatment of COVID-19 in adults who have mild-to-moderate COVID-19 and are at high risk for progressing to severe COVID-19, including hospitalization or death. 3. Other therapeutics are currently authorized. For additional information on all products authorized for treatment or prevention of COVID-19, please see  [facility].  4. There are benefits and risks of taking this treatment as outlined in the "Fact Sheet for Patients and Caregivers."  5. "Fact Sheet for Patients and Caregivers" was reviewed with patient. A hard copy will be provided to patient from pharmacy prior to the patient receiving treatment. 6. Patients should continue to self-isolate and use infection control measures (e.g., wear mask, isolate, social distance, avoid sharing personal items, clean and disinfect "high touch" surfaces, and frequent handwashing) according to CDC guidelines.  7. The patient or parent/caregiver has the option to accept or refuse treatment. 8. [facility] has established a pregnancy surveillance program. 9. Females of childbearing potential should use a reliable method of contraception correctly and consistently, as applicable, for the duration of treatment and for 4 days after the last dose of Molnupiravir. 68. Males of reproductive potential who are sexually active with females of childbearing potential should use a reliable method of contraception correctly and consistently during treatment and for at least 3 months after the last dose. 11. Pregnancy status and risk was assessed. Patient verbalized understanding of precautions.   After reviewing above information with the patient, the patient agrees to receive molnupiravir.  Follow up instructions:    . Take prescription BID x 5 days as directed .  Reach out to pharmacist for counseling on medication if desired . For concerns regarding further COVID symptoms please follow up with your PCP or urgent care . For urgent or life-threatening issues, seek care at your local emergency department  The patient was provided an opportunity to ask questions, and all were answered. The patient agreed with the plan and demonstrated an understanding of the  instructions.   Script sent to [facility] and opted to pick up RX.  The patient was advised to call their PCP or seek an in-person evaluation if the symptoms worsen or if the condition fails to improve as anticipated.   I provided 35 minutes of non face-to-face telephone visit time during this encounter, and > 50% was spent counseling as documented under my assessment & plan.  [place], [place] 10/10/2020 /2:59 PM

## 2020-10-11 ENCOUNTER — Telehealth (HOSPITAL_COMMUNITY): Payer: Self-pay

## 2020-10-11 DIAGNOSIS — U071 COVID-19: Secondary | ICD-10-CM | POA: Diagnosis not present

## 2020-10-11 DIAGNOSIS — G894 Chronic pain syndrome: Secondary | ICD-10-CM | POA: Diagnosis not present

## 2020-10-11 DIAGNOSIS — Z87891 Personal history of nicotine dependence: Secondary | ICD-10-CM | POA: Diagnosis not present

## 2020-10-11 DIAGNOSIS — M1991 Primary osteoarthritis, unspecified site: Secondary | ICD-10-CM | POA: Diagnosis not present

## 2020-10-11 DIAGNOSIS — M158 Other polyosteoarthritis: Secondary | ICD-10-CM | POA: Diagnosis not present

## 2020-10-11 DIAGNOSIS — F1120 Opioid dependence, uncomplicated: Secondary | ICD-10-CM | POA: Diagnosis not present

## 2020-10-11 DIAGNOSIS — J45909 Unspecified asthma, uncomplicated: Secondary | ICD-10-CM | POA: Diagnosis not present

## 2020-10-11 MED FILL — MOLNUPIRAVIR 200 MG CAPS: 200 | 5 days supply | Qty: 40 | Fill #0

## 2020-10-11 NOTE — Telephone Encounter (Signed)
Patient was prescribed oral covid treatment Molnupiravir and treatment note was reviewed. Medication has been received by [facility] and reviewed for appropriateness.  Drug Interactions or Dosage Adjustments Noted: none  Delivery Method: pick up  Patient contacted for counseling on 10/11/20 and verbalized understanding.   Delivery or Pick-Up Date: 10/11/20   Alinda Dooms 10/11/2020, 9:37 AM [place] Outpatient Pharmacist Phone# [phone]

## 2020-10-13 DIAGNOSIS — M353 Polymyalgia rheumatica: Secondary | ICD-10-CM | POA: Diagnosis not present

## 2020-10-13 DIAGNOSIS — U099 Post covid-19 condition, unspecified: Secondary | ICD-10-CM | POA: Diagnosis not present

## 2020-10-13 DIAGNOSIS — G894 Chronic pain syndrome: Secondary | ICD-10-CM | POA: Diagnosis not present

## 2020-10-28 DIAGNOSIS — M1711 Unilateral primary osteoarthritis, right knee: Secondary | ICD-10-CM | POA: Diagnosis not present

## 2020-11-08 DIAGNOSIS — F1120 Opioid dependence, uncomplicated: Secondary | ICD-10-CM | POA: Diagnosis not present

## 2020-11-08 DIAGNOSIS — J45909 Unspecified asthma, uncomplicated: Secondary | ICD-10-CM | POA: Diagnosis not present

## 2020-11-08 DIAGNOSIS — M158 Other polyosteoarthritis: Secondary | ICD-10-CM | POA: Diagnosis not present

## 2020-11-08 DIAGNOSIS — Z87891 Personal history of nicotine dependence: Secondary | ICD-10-CM | POA: Diagnosis not present

## 2020-11-15 DIAGNOSIS — M5030 Other cervical disc degeneration, unspecified cervical region: Secondary | ICD-10-CM | POA: Diagnosis not present

## 2020-11-15 DIAGNOSIS — M159 Polyosteoarthritis, unspecified: Secondary | ICD-10-CM | POA: Diagnosis not present

## 2020-11-15 DIAGNOSIS — M1711 Unilateral primary osteoarthritis, right knee: Secondary | ICD-10-CM | POA: Diagnosis not present

## 2020-11-15 DIAGNOSIS — G894 Chronic pain syndrome: Secondary | ICD-10-CM | POA: Diagnosis not present

## 2020-11-29 DIAGNOSIS — M25561 Pain in right knee: Secondary | ICD-10-CM | POA: Diagnosis not present

## 2020-12-01 DIAGNOSIS — J329 Chronic sinusitis, unspecified: Secondary | ICD-10-CM | POA: Diagnosis not present

## 2020-12-01 DIAGNOSIS — M1991 Primary osteoarthritis, unspecified site: Secondary | ICD-10-CM | POA: Diagnosis not present

## 2020-12-01 DIAGNOSIS — G894 Chronic pain syndrome: Secondary | ICD-10-CM | POA: Diagnosis not present

## 2020-12-01 DIAGNOSIS — J441 Chronic obstructive pulmonary disease with (acute) exacerbation: Secondary | ICD-10-CM | POA: Diagnosis not present

## 2020-12-08 DIAGNOSIS — Z87891 Personal history of nicotine dependence: Secondary | ICD-10-CM | POA: Diagnosis not present

## 2020-12-08 DIAGNOSIS — G894 Chronic pain syndrome: Secondary | ICD-10-CM | POA: Diagnosis not present

## 2020-12-08 DIAGNOSIS — F1120 Opioid dependence, uncomplicated: Secondary | ICD-10-CM | POA: Diagnosis not present

## 2020-12-08 DIAGNOSIS — K219 Gastro-esophageal reflux disease without esophagitis: Secondary | ICD-10-CM | POA: Diagnosis not present

## 2020-12-10 DIAGNOSIS — G894 Chronic pain syndrome: Secondary | ICD-10-CM | POA: Diagnosis not present

## 2020-12-10 DIAGNOSIS — M159 Polyosteoarthritis, unspecified: Secondary | ICD-10-CM | POA: Diagnosis not present

## 2020-12-10 DIAGNOSIS — J22 Unspecified acute lower respiratory infection: Secondary | ICD-10-CM | POA: Diagnosis not present

## 2020-12-10 DIAGNOSIS — M5030 Other cervical disc degeneration, unspecified cervical region: Secondary | ICD-10-CM | POA: Diagnosis not present

## 2020-12-10 DIAGNOSIS — F419 Anxiety disorder, unspecified: Secondary | ICD-10-CM | POA: Diagnosis not present

## 2020-12-16 DIAGNOSIS — E663 Overweight: Secondary | ICD-10-CM | POA: Diagnosis not present

## 2020-12-16 DIAGNOSIS — G894 Chronic pain syndrome: Secondary | ICD-10-CM | POA: Diagnosis not present

## 2020-12-16 DIAGNOSIS — K219 Gastro-esophageal reflux disease without esophagitis: Secondary | ICD-10-CM | POA: Diagnosis not present

## 2020-12-16 DIAGNOSIS — Z1389 Encounter for screening for other disorder: Secondary | ICD-10-CM | POA: Diagnosis not present

## 2020-12-16 DIAGNOSIS — K5732 Diverticulitis of large intestine without perforation or abscess without bleeding: Secondary | ICD-10-CM | POA: Diagnosis not present

## 2020-12-16 DIAGNOSIS — Z6828 Body mass index (BMI) 28.0-28.9, adult: Secondary | ICD-10-CM | POA: Diagnosis not present

## 2020-12-16 DIAGNOSIS — Z0001 Encounter for general adult medical examination with abnormal findings: Secondary | ICD-10-CM | POA: Diagnosis not present

## 2020-12-16 DIAGNOSIS — F419 Anxiety disorder, unspecified: Secondary | ICD-10-CM | POA: Diagnosis not present

## 2020-12-16 DIAGNOSIS — N951 Menopausal and female climacteric states: Secondary | ICD-10-CM | POA: Diagnosis not present

## 2020-12-16 DIAGNOSIS — I700 Atherosclerosis of aorta: Secondary | ICD-10-CM | POA: Diagnosis not present

## 2020-12-16 DIAGNOSIS — M159 Polyosteoarthritis, unspecified: Secondary | ICD-10-CM | POA: Diagnosis not present

## 2020-12-16 NOTE — Progress Notes (Signed)
DUE TO COVID-19 ONLY ONE VISITOR IS ALLOWED TO COME WITH YOU AND STAY IN THE WAITING ROOM ONLY DURING PRE OP AND PROCEDURE DAY OF SURGERY. THE 1 VISITOR  MAY VISIT WITH YOU AFTER SURGERY IN YOUR PRIVATE ROOM DURING VISITING HOURS ONLY!  YOU NEED TO HAVE A COVID 19 TEST ON___4/21/22____ @_______ , THIS TEST MUST BE DONE BEFORE SURGERY,  COVID TESTING SITE [address] [place] [address], IT IS ON THE RIGHT GOING OUT [address] APPROXIMATELY  2 MINUTES PAST [employer] ON THE RIGHT. ONCE YOUR COVID TEST IS COMPLETED,  PLEASE BEGIN THE QUARANTINE INSTRUCTIONS AS OUTLINED IN YOUR HANDOUT.                Gina Wolfe  12/16/2020   Your procedure is scheduled on:         01/03/21  Report to [facility] Main  Entrance   Report to admitting at      [place] AM     Call this number if you have problems the morning of surgery [phone]    REMEMBER: NO  SOLID FOOD CANDY OR GUM AFTER MIDNIGHT. CLEAR LIQUIDS UNTIL      0415am    . NOTHING BY MOUTH EXCEPT CLEAR LIQUIDS UNTIL    0415am     . PLEASE FINISH ENSURE DRINK PER SURGEON ORDER  WHICH NEEDS TO BE COMPLETED AT  0415am     .      CLEAR LIQUID DIET   Foods Allowed                                                                    Coffee and tea, regular and decaf                            Fruit ices (not with fruit pulp)                                      Iced Popsicles                                    Carbonated beverages, regular and diet                                    Cranberry, grape and apple juices Sports drinks like Gatorade Lightly seasoned clear broth or consume(fat free) Sugar, honey syrup ___________________________________________________________________      BRUSH YOUR TEETH MORNING OF SURGERY AND RINSE YOUR MOUTH OUT, NO CHEWING GUM CANDY OR MINTS.     Take these medicines the morning of surgery with A SIP OF WATER:  Inhalers as usual and bring  DO NOT TAKE ANY DIABETIC MEDICATIONS  DAY OF YOUR SURGERY                               You may not have any metal on your body including hair pins and  piercings  Do not wear jewelry, make-up, lotions, powders or perfumes, deodorant             Do not wear nail polish on your fingernails.  Do not shave  48 hours prior to surgery.              Men may shave face and neck.   Do not bring valuables to the hospital. [place].  Contacts, dentures or bridgework may not be worn into surgery.  Leave suitcase in the car. After surgery it may be brought to your room.     Patients discharged the day of surgery will not be allowed to drive home. IF YOU ARE HAVING SURGERY AND GOING HOME THE SAME DAY, YOU MUST HAVE AN ADULT TO DRIVE YOU HOME AND BE WITH YOU FOR 24 HOURS. YOU MAY GO HOME BY TAXI OR UBER OR ORTHERWISE, BUT AN ADULT MUST ACCOMPANY YOU HOME AND STAY WITH YOU FOR 24 HOURS.  Name and phone number of your driver:  Special Instructions: N/A              Please read over the following fact sheets you were given: _____________________________________________________________________  [facility] - Preparing for Surgery Before surgery, you can play an important role.  Because skin is not sterile, your skin needs to be as free of germs as possible.  You can reduce the number of germs on your skin by washing with CHG (chlorahexidine gluconate) soap before surgery.  CHG is an antiseptic cleaner which kills germs and bonds with the skin to continue killing germs even after washing. Please DO NOT use if you have an allergy to CHG or antibacterial soaps.  If your skin becomes reddened/irritated stop using the CHG and inform your nurse when you arrive at Short Stay. Do not shave (including legs and underarms) for at least 48 hours prior to the first CHG shower.  You may shave your face/neck. Please follow these instructions carefully:  1.  Shower with CHG Soap the night before  surgery and the  morning of Surgery.  2.  If you choose to wash your hair, wash your hair first as usual with your  normal  shampoo.  3.  After you shampoo, rinse your hair and body thoroughly to remove the  shampoo.                           4.  Use CHG as you would any other liquid soap.  You can apply chg directly  to the skin and wash                       Gently with a scrungie or clean washcloth.  5.  Apply the CHG Soap to your body ONLY FROM THE NECK DOWN.   Do not use on face/ open                           Wound or open sores. Avoid contact with eyes, ears mouth and genitals (private parts).                       Wash face,  Genitals (private parts) with your normal soap.             6.  Wash thoroughly, paying special attention to the area where your surgery  will be performed.  7.  Thoroughly rinse your body with warm water from the neck down.  8.  DO NOT shower/wash with your normal soap after using and rinsing off  the CHG Soap.                9.  Pat yourself dry with a clean towel.            10.  Wear clean pajamas.            11.  Place clean sheets on your bed the night of your first shower and do not  sleep with pets. Day of Surgery : Do not apply any lotions/deodorants the morning of surgery.  Please wear clean clothes to the hospital/surgery center.  FAILURE TO FOLLOW THESE INSTRUCTIONS MAY RESULT IN THE CANCELLATION OF YOUR SURGERY PATIENT SIGNATURE_________________________________  NURSE SIGNATURE__________________________________  ________________________________________________________________________

## 2020-12-22 ENCOUNTER — Encounter (HOSPITAL_COMMUNITY): Payer: Self-pay

## 2020-12-22 ENCOUNTER — Other Ambulatory Visit: Payer: Self-pay

## 2020-12-22 ENCOUNTER — Encounter (HOSPITAL_COMMUNITY)
Admission: RE | Admit: 2020-12-22 | Discharge: 2020-12-22 | Disposition: A | Discharge status: Home / Self Care | Payer: Medicare HMO | Source: Ambulatory Visit | Attending: Orthopedic Surgery | Admitting: Orthopedic Surgery

## 2020-12-22 DIAGNOSIS — Z01812 Encounter for preprocedural laboratory examination: Secondary | ICD-10-CM | POA: Diagnosis not present

## 2020-12-22 LAB — COMPREHENSIVE METABOLIC PANEL
ALT: 19 U/L (ref 0–44)
AST: 19 U/L (ref 15–41)
Albumin: 3.6 g/dL (ref 3.5–5.0)
Alkaline Phosphatase: 39 U/L (ref 38–126)
Anion gap: 8 (ref 5–15)
BUN: 13 mg/dL (ref 8–23)
CO2: 26 mmol/L (ref 22–32)
Calcium: 9.3 mg/dL (ref 8.9–10.3)
Chloride: 109 mmol/L (ref 98–111)
Creatinine, Ser: 0.68 mg/dL (ref 0.44–1.00)
GFR, Estimated: >60 mL/min (ref >60)
Glucose, Bld: 117 mg/dL — ABNORMAL HIGH (ref 70–99)
Potassium: 4.2 mmol/L (ref 3.5–5.1)
Sodium: 143 mmol/L (ref 135–145)
Total Bilirubin: 0.1 mg/dL — ABNORMAL LOW (ref 0.3–1.2)
Total Protein: 6.3 g/dL — ABNORMAL LOW (ref 6.5–8.1)

## 2020-12-22 LAB — SURGICAL PCR SCREEN
MRSA, PCR: NEGATIVE
Staphylococcus aureus: NEGATIVE

## 2020-12-22 LAB — TYPE AND SCREEN
ABO/RH(D): A POS
Antibody Screen: NEGATIVE

## 2020-12-22 LAB — PROTIME-INR
INR: 1 (ref 0.8–1.2)
Prothrombin Time: 12.9 seconds (ref 11.4–15.2)

## 2020-12-22 LAB — CBC
HCT: 40.8 % (ref 36.0–46.0)
Hemoglobin: 13 g/dL (ref 12.0–15.0)
MCH: 30.3 pg (ref 26.0–34.0)
MCHC: 31.9 g/dL (ref 30.0–36.0)
MCV: 95.1 fL (ref 80.0–100.0)
Platelets: 293 10*3/uL (ref 150–400)
RBC: 4.29 MIL/uL (ref 3.87–5.11)
RDW: 13 % (ref 11.5–15.5)
WBC: 5.8 10*3/uL (ref 4.0–10.5)
nRBC: 0 % (ref 0.0–0.2)

## 2020-12-22 LAB — APTT: aPTT: 30 seconds (ref 24–36)

## 2020-12-22 NOTE — Progress Notes (Addendum)
Anesthesia Review:  PCP: DR Redmond [facility]  [place] dated 11/03/20 on chart  Cardiologist : none  Chest x-ray : EKG : Echo : Stress test: Cardiac Cath :  Activity level: can do a flight of stairs without difficulty  Sleep Study/ CPAP : none  Fasting Blood Sugar :      / Checks Blood Sugar -- times a day:   Blood Thinner/ Instructions /Last Dose: ASA / Instructions/ Last Dose :  Covid positive on 10/08/20- results in epic- 87 days from surgery .  No preop covid test done.

## 2020-12-28 NOTE — H&P (Signed)
TOTAL KNEE ADMISSION H&P  Patient is being admitted for right total knee arthroplasty.  Subjective:  Chief Complaint: Right knee pain.  HPI: Gina Wolfe, 65 y.o. female has a history of pain and functional disability in the right knee due to arthritis and has failed non-surgical conservative treatments for greater than 12 weeks to include NSAID's and/or analgesics and activity modification. Onset of symptoms was gradual, starting several years ago with gradually worsening course since that time. The patient noted prior procedures on the knee to include  arthroscopy and menisectomy on the right knee.  Patient currently rates pain in the right knee at 7 out of 10 with activity. Patient has worsening of pain with activity and weight bearing, pain that interferes with activities of daily living and crepitus. Patient has evidence of the medial compartment is very degenerative. There is degeneration in the ACL ligament. She does have some degenerative change in patellofemoral but mild by imaging studies. There is no active infection.  Patient Active Problem List   Diagnosis Date Noted  . Lumbar herniated disc 07/13/2016  . Early satiety 02/02/2015  . Shortness of breath 08/24/2014  . GERD 05/06/2010  . Constipation 05/06/2010    Past Medical History:  Diagnosis Date  . Arthritis   . Asthma   . Chronic pain syndrome   . Diverticulitis   . GERD (gastroesophageal reflux disease)   . IBS (irritable bowel syndrome)   . PONV (postoperative nausea and vomiting)   . Spinal headache     Past Surgical History:  Procedure Laterality Date  . ABDOMINAL HYSTERECTOMY    . BACK SURGERY     x3 previous lumbar, x3 cervical surgeries  . CHOLECYSTECTOMY    . COLONOSCOPY  05/20/10   LKG:[id number] hemorrhoids otherwise normal/EGD:distal esophageal erosions otherwise normal  . CYST EXCISION Right    Eye cyst  . EYE SURGERY Right    removed a cyst  . LUMBAR LAMINECTOMY/DECOMPRESSION MICRODISCECTOMY N/A  07/13/2016   Procedure: DISCECTOMY Lumbar four-five;  Surgeon: Newman Pies, MD;  Location: [facility];  Service: Neurosurgery;  Laterality: N/A;  . MENISCUS REPAIR  2007   R knee   . POSTERIOR CERVICAL FUSION/FORAMINOTOMY N/A 04/10/2013   Procedure: POSTERIOR CERVICAL FUSION/FORAMINOTOMY LEVEL 2;  Surgeon: Sinclair Ship, MD;  Location: [facility];  Service: Orthopedics;  Laterality: N/A;  Posterior cervical decompression fusion, cervical 5-6, cervical 6-7 with instrumentation and allograft.  . TUBAL LIGATION      Prior to Admission medications   Medication Sig Start Date End Date Taking? Authorizing Provider  albuterol (PROVENTIL HFA;VENTOLIN HFA) 108 (90 Base) MCG/ACT inhaler Inhale 2 puffs into the lungs every 6 (six) hours as needed for wheezing or shortness of breath.    Yes [provider]  ALPRAZolam Duanne Moron) 1 MG tablet Take 1 mg by mouth 4 (four) times daily as needed for anxiety.    Yes [provider]  cholecalciferol (VITAMIN D3) 25 MCG (1000 UNIT) tablet Take 1,000 Units by mouth daily.   Yes [provider]  estrogens, conjugated, (PREMARIN) 0.625 MG tablet Take 0.625 mg by mouth daily with breakfast.    Yes [provider]  furosemide (LASIX) 20 MG tablet Take 20 mg by mouth daily as needed for edema.    Yes [provider]  linaclotide (LINZESS) 145 MCG CAPS capsule Take 145 mcg by mouth daily as needed (constipation).   Yes [provider]  meloxicam (MOBIC) 7.5 MG tablet Take 7.5 mg by mouth 2 (two) times  daily.    Yes [provider]  promethazine (PHENERGAN) 25 MG tablet Take 25 mg by mouth 2 (two) times daily as needed for nausea or vomiting.   Yes [provider]  vitamin B-12 (CYANOCOBALAMIN) 1000 MCG tablet Take 1,000 mcg by mouth daily.   Yes [provider]  vitamin C (ASCORBIC ACID) 500 MG tablet Take 500 mg by mouth daily.   Yes [provider]  Molnupiravir 200 MG CAPS TAKE 4  CAPSULES BY MOUTH 2 TIMES DAILY FOR 5 DAYS. 10/10/20 10/10/21  [name], PA    Allergies  Allergen Reactions  . Clindamycin Hcl Anaphylaxis  . Codeine Nausea Only    Social History   Socioeconomic History  . Marital status: Married    Spouse name: Not on file  . Number of children: Not on file  . Years of education: Not on file  . Highest education level: Not on file  Occupational History  . Not on file  Tobacco Use  . Smoking status: Former Smoker    Types: Cigarettes    Quit date: 08/25/2007    Years since quitting: 13.3  . Smokeless tobacco: Never Used  Vaping Use  . Vaping Use: Never used  Substance and Sexual Activity  . Alcohol use: No    Alcohol/week: 0.0 standard drinks  . Drug use: No  . Sexual activity: Not on file  Other Topics Concern  . Not on file  Social History Narrative  . Not on file   Social Determinants of Health   Financial Resource Strain: Not on file  Food Insecurity: Not on file  Transportation Needs: Not on file  Physical Activity: Not on file  Stress: Not on file  Social Connections: Not on file  Intimate Partner Violence: Not on file    Tobacco Use: Medium Risk  . Smoking Tobacco Use: Former Smoker  . Smokeless Tobacco Use: Never Used   Social History   Substance and Sexual Activity  Alcohol Use No  . Alcohol/week: 0.0 standard drinks    Family History  Problem Relation Age of Onset  . Hypertension Mother   . Emphysema Mother   . Cancer Father   . Hypertension Other   . Diabetes Other   . Colon cancer Neg Hx   . Colon polyps Neg Hx     Review of Systems  Constitutional: Negative for chills and fever.  HENT: Negative for congestion, sore throat and tinnitus.   Eyes: Negative for double vision, photophobia and pain.  Respiratory: Negative for cough, shortness of breath and wheezing.   Cardiovascular: Negative for chest pain, palpitations and orthopnea.  Gastrointestinal: Negative for heartburn, nausea and  vomiting.  Genitourinary: Negative for dysuria, frequency and urgency.  Musculoskeletal: Positive for joint pain.  Neurological: Negative for dizziness, weakness and headaches.    Objective:  Physical Exam: Well nourished and well developed.  General: Alert and oriented x3, cooperative and pleasant, no acute distress.  Head: normocephalic, atraumatic, neck supple.  Eyes: EOMI.  Respiratory: breath sounds clear in all fields, no wheezing, rales, or rhonchi. Cardiovascular: Regular rate and rhythm, no murmurs, gallops or rubs.  Abdomen: non-tender to palpation and soft, normoactive bowel sounds. Musculoskeletal:  Right Knee Exam:  No effusion present. No swelling present.  Range of motion: 0 to 135 degrees.  Slight crepitus on range of motion of the knee.  Significant medial joint line tenderness.  No lateral joint line tenderness.  Baker's cyst on posterior knee.  The knee is stable.  Calves soft and nontender. Motor function intact in LE. Strength 5/5 LE bilaterally. Neuro: Distal pulses 2+. Sensation to light touch intact in LE.  Imaging Review Plain radiographs demonstrate severe degenerative joint disease of the right knee. The overall alignment is neutral. The bone quality appears to be adequate for age and reported activity level.  Assessment/Plan:  End stage arthritis, right knee   The patient history, physical examination, clinical judgment of the provider and imaging studies are consistent with end stage degenerative joint disease of the right knee and total knee arthroplasty is deemed medically necessary. The treatment options including medical management, injection therapy arthroscopy and arthroplasty were discussed at length. The risks and benefits of total knee arthroplasty were presented and reviewed. The risks due to aseptic loosening, infection, stiffness, patella tracking problems, thromboembolic complications and other imponderables were discussed. The patient  acknowledged the explanation, agreed to proceed with the plan and consent was signed. Patient is being admitted for inpatient treatment for surgery, pain control, PT, OT, prophylactic antibiotics, VTE prophylaxis, progressive ambulation and ADLs and discharge planning. The patient is planning to be discharged home.   Patient's anticipated LOS is less than 2 midnights, meeting these requirements: - Younger than 93 - Lives within 1 hour of care - Has a competent adult at home to recover with post-op recover - NO history of  - Chronic pain requiring opiods  - Diabetes  - Coronary Artery Disease  - Heart failure  - Heart attack  - Stroke  - DVT/VTE  - Cardiac arrhythmia  - Respiratory Failure/COPD  - Renal failure  - Anemia  - Advanced Liver disease  Therapy Plans: Outpatient therapy at [facility] Disposition: Home with husband Planned DVT Prophylaxis: Aspirin 325 mg BID DME Needed: [facility], 3-in-1 PCP: Dr. Gerarda Fraction (clearance received) TXA: IV Allergies: Clindamycin (anaphyalxis), codeine (nausea) Anesthesia Concerns: Nausea/vomiting (scopolamine works well) BMI: 28 Last HgbA1c: Not diabetic Pharmacy: [facility]  Other: Percocet 10-325 mg Q4. Discussed dilaudid postoperatively.  - Patient was instructed on what medications to stop prior to surgery. - Follow-up visit in 2 weeks with Dr. Wynelle Link - Begin physical therapy following surgery - Pre-operative lab work as pre-surgical testing - Prescriptions will be provided in hospital at time of discharge  Theresa Duty, PA-C Orthopedic Surgery [facility] [place]

## 2021-01-02 MED ORDER — BUPIVACAINE LIPOSOME 1.3 % IJ SUSP
20.0000 mL | Freq: Once | INTRAMUSCULAR | Status: DC
  Filled 2021-01-02: qty 20

## 2021-01-03 ENCOUNTER — Observation Stay (HOSPITAL_COMMUNITY)
Admission: RE | Admit: 2021-01-03 | Discharge: 2021-01-04 | Disposition: A | Discharge status: Home / Self Care | Payer: Medicare HMO | Source: Ambulatory Visit | Attending: Orthopedic Surgery | Admitting: Orthopedic Surgery

## 2021-01-03 ENCOUNTER — Ambulatory Visit (HOSPITAL_COMMUNITY): Payer: Medicare HMO | Admitting: Certified Registered"

## 2021-01-03 ENCOUNTER — Encounter (HOSPITAL_COMMUNITY): Payer: Self-pay | Admitting: Orthopedic Surgery

## 2021-01-03 ENCOUNTER — Ambulatory Visit (HOSPITAL_COMMUNITY): Payer: Medicare HMO | Admitting: Physician Assistant

## 2021-01-03 ENCOUNTER — Other Ambulatory Visit: Payer: Self-pay

## 2021-01-03 ENCOUNTER — Encounter (HOSPITAL_COMMUNITY)
Admission: RE | Disposition: A | Discharge status: Home / Self Care | Payer: Self-pay | Source: Ambulatory Visit | Attending: Orthopedic Surgery

## 2021-01-03 DIAGNOSIS — M5126 Other intervertebral disc displacement, lumbar region: Secondary | ICD-10-CM | POA: Diagnosis not present

## 2021-01-03 DIAGNOSIS — M1710 Unilateral primary osteoarthritis, unspecified knee: Secondary | ICD-10-CM

## 2021-01-03 DIAGNOSIS — K219 Gastro-esophageal reflux disease without esophagitis: Secondary | ICD-10-CM | POA: Diagnosis not present

## 2021-01-03 DIAGNOSIS — J45909 Unspecified asthma, uncomplicated: Secondary | ICD-10-CM | POA: Diagnosis not present

## 2021-01-03 DIAGNOSIS — M179 Osteoarthritis of knee, unspecified: Secondary | ICD-10-CM

## 2021-01-03 DIAGNOSIS — M1711 Unilateral primary osteoarthritis, right knee: Principal | ICD-10-CM | POA: Diagnosis present

## 2021-01-03 DIAGNOSIS — Z87891 Personal history of nicotine dependence: Secondary | ICD-10-CM | POA: Diagnosis not present

## 2021-01-03 DIAGNOSIS — G8918 Other acute postprocedural pain: Secondary | ICD-10-CM | POA: Diagnosis not present

## 2021-01-03 HISTORY — PX: TOTAL KNEE ARTHROPLASTY: SHX125

## 2021-01-03 SURGERY — ARTHROPLASTY, KNEE, TOTAL
Anesthesia: Spinal | Site: Knee | Laterality: Right

## 2021-01-03 MED ORDER — ONDANSETRON HCL 4 MG/2ML IJ SOLN
4.0000 mg | Freq: Four times a day (QID) | INTRAMUSCULAR | Status: DC | PRN
  Administered 2021-01-03: 4 mg via INTRAVENOUS
  Filled 2021-01-03: qty 2

## 2021-01-03 MED ORDER — PROMETHAZINE HCL 25 MG PO TABS
25.0000 mg | ORAL_TABLET | Freq: Four times a day (QID) | ORAL | Status: DC | PRN
  Administered 2021-01-03: 25 mg via ORAL
  Filled 2021-01-03: qty 1

## 2021-01-03 MED ORDER — DOCUSATE SODIUM 100 MG PO CAPS
100.0000 mg | ORAL_CAPSULE | Freq: Two times a day (BID) | ORAL | Status: DC
  Administered 2021-01-03 – 2021-01-04 (×2): 100 mg via ORAL
  Filled 2021-01-03 (×2): qty 1

## 2021-01-03 MED ORDER — BISACODYL 10 MG RE SUPP
10.0000 mg | Freq: Every day | RECTAL | Status: DC | PRN
Start: 2021-01-03 — End: 2021-01-04

## 2021-01-03 MED ORDER — POVIDONE-IODINE 10 % EX SWAB
2.0000 "application " | Freq: Once | CUTANEOUS | Status: AC
  Administered 2021-01-03: 2 via TOPICAL

## 2021-01-03 MED ORDER — FENTANYL CITRATE (PF) 100 MCG/2ML IJ SOLN
INTRAMUSCULAR | Status: DC | PRN
  Administered 2021-01-03 (×2): 50 ug via INTRAVENOUS

## 2021-01-03 MED ORDER — MIDAZOLAM HCL 2 MG/2ML IJ SOLN
INTRAMUSCULAR | Status: AC
  Filled 2021-01-03: qty 2

## 2021-01-03 MED ORDER — SODIUM CHLORIDE (PF) 0.9 % IJ SOLN
INTRAMUSCULAR | Status: AC
  Filled 2021-01-03: qty 10

## 2021-01-03 MED ORDER — PROMETHAZINE HCL 25 MG PO TABS: 25.0000 mg | ORAL_TABLET | Freq: Two times a day (BID) | ORAL | Status: DC | PRN

## 2021-01-03 MED ORDER — ACETAMINOPHEN 10 MG/ML IV SOLN
1000.0000 mg | Freq: Four times a day (QID) | INTRAVENOUS | Status: DC
  Administered 2021-01-03: 1000 mg via INTRAVENOUS
  Filled 2021-01-03: qty 100

## 2021-01-03 MED ORDER — TRAMADOL HCL 50 MG PO TABS
50.0000 mg | ORAL_TABLET | Freq: Four times a day (QID) | ORAL | Status: DC | PRN
  Administered 2021-01-03 – 2021-01-04 (×4): 50 mg via ORAL
  Filled 2021-01-03 (×4): qty 1

## 2021-01-03 MED ORDER — MENTHOL 3 MG MT LOZG: 1.0000 | LOZENGE | OROMUCOSAL | Status: DC | PRN

## 2021-01-03 MED ORDER — CEFAZOLIN SODIUM-DEXTROSE 2-4 GM/100ML-% IV SOLN
2.0000 g | Freq: Four times a day (QID) | INTRAVENOUS | Status: AC
  Administered 2021-01-03 (×2): 2 g via INTRAVENOUS
  Filled 2021-01-03 (×2): qty 100

## 2021-01-03 MED ORDER — ACETAMINOPHEN 500 MG PO TABS
1000.0000 mg | ORAL_TABLET | Freq: Four times a day (QID) | ORAL | Status: AC
  Administered 2021-01-03 – 2021-01-04 (×3): 1000 mg via ORAL
  Filled 2021-01-03 (×4): qty 2

## 2021-01-03 MED ORDER — 0.9 % SODIUM CHLORIDE (POUR BTL) OPTIME
TOPICAL | Status: DC | PRN
  Administered 2021-01-03: 1000 mL

## 2021-01-03 MED ORDER — PHENYLEPHRINE HCL (PRESSORS) 10 MG/ML IV SOLN
INTRAVENOUS | Status: AC
  Filled 2021-01-03: qty 1

## 2021-01-03 MED ORDER — LINACLOTIDE 145 MCG PO CAPS
145.0000 ug | ORAL_CAPSULE | Freq: Every day | ORAL | Status: DC | PRN
  Filled 2021-01-03: qty 1

## 2021-01-03 MED ORDER — POLYETHYLENE GLYCOL 3350 17 G PO PACK: 17.0000 g | PACK | Freq: Every day | ORAL | Status: DC | PRN

## 2021-01-03 MED ORDER — ONDANSETRON HCL 4 MG PO TABS: 4.0000 mg | ORAL_TABLET | Freq: Four times a day (QID) | ORAL | Status: DC | PRN

## 2021-01-03 MED ORDER — FLEET ENEMA 7-19 GM/118ML RE ENEM: 1.0000 | ENEMA | Freq: Once | RECTAL | Status: DC | PRN

## 2021-01-03 MED ORDER — PHENYLEPHRINE HCL-NACL 10-0.9 MG/250ML-% IV SOLN
INTRAVENOUS | Status: DC | PRN
  Administered 2021-01-03: 20 ug/min via INTRAVENOUS

## 2021-01-03 MED ORDER — BUPIVACAINE HCL (PF) 0.5 % IJ SOLN
INTRAMUSCULAR | Status: DC | PRN
  Administered 2021-01-03: 20 mL via PERINEURAL

## 2021-01-03 MED ORDER — PHENOL 1.4 % MT LIQD: 1.0000 | OROMUCOSAL | Status: DC | PRN

## 2021-01-03 MED ORDER — MORPHINE SULFATE (PF) 2 MG/ML IV SOLN: 0.5000 mg | INTRAVENOUS | Status: DC | PRN

## 2021-01-03 MED ORDER — ASPIRIN EC 325 MG PO TBEC
325.0000 mg | DELAYED_RELEASE_TABLET | Freq: Two times a day (BID) | ORAL | Status: DC
  Administered 2021-01-04: 325 mg via ORAL
  Filled 2021-01-03: qty 1

## 2021-01-03 MED ORDER — HYDROMORPHONE HCL 2 MG PO TABS
2.0000 mg | ORAL_TABLET | ORAL | Status: DC | PRN
  Administered 2021-01-03 – 2021-01-04 (×2): 2 mg via ORAL
  Filled 2021-01-03 (×2): qty 1

## 2021-01-03 MED ORDER — ALPRAZOLAM 1 MG PO TABS: 1.0000 mg | ORAL_TABLET | Freq: Four times a day (QID) | ORAL | Status: DC | PRN

## 2021-01-03 MED ORDER — ONDANSETRON HCL 4 MG/2ML IJ SOLN
INTRAMUSCULAR | Status: DC | PRN
  Administered 2021-01-03: 4 mg via INTRAVENOUS

## 2021-01-03 MED ORDER — CEFAZOLIN SODIUM-DEXTROSE 2-4 GM/100ML-% IV SOLN
2.0000 g | INTRAVENOUS | Status: AC
  Administered 2021-01-03: 2 g via INTRAVENOUS
  Filled 2021-01-03: qty 100

## 2021-01-03 MED ORDER — METOCLOPRAMIDE HCL 5 MG PO TABS
5.0000 mg | ORAL_TABLET | Freq: Three times a day (TID) | ORAL | Status: DC | PRN
Start: 2021-01-03 — End: 2021-01-04

## 2021-01-03 MED ORDER — MIDAZOLAM HCL 2 MG/2ML IJ SOLN
INTRAMUSCULAR | Status: DC | PRN
  Administered 2021-01-03 (×2): 1 mg via INTRAVENOUS

## 2021-01-03 MED ORDER — ONDANSETRON HCL 4 MG/2ML IJ SOLN
INTRAMUSCULAR | Status: AC
  Filled 2021-01-03: qty 2

## 2021-01-03 MED ORDER — TRANEXAMIC ACID-NACL 1000-0.7 MG/100ML-% IV SOLN
1000.0000 mg | INTRAVENOUS | Status: AC
  Administered 2021-01-03: 1000 mg via INTRAVENOUS
  Filled 2021-01-03: qty 100

## 2021-01-03 MED ORDER — PROPOFOL 1000 MG/100ML IV EMUL
INTRAVENOUS | Status: AC
  Filled 2021-01-03: qty 100

## 2021-01-03 MED ORDER — STERILE WATER FOR IRRIGATION IR SOLN
Status: DC | PRN
  Administered 2021-01-03: 2000 mL

## 2021-01-03 MED ORDER — BUPIVACAINE LIPOSOME 1.3 % IJ SUSP
INTRAMUSCULAR | Status: DC | PRN
  Administered 2021-01-03: 20 mL

## 2021-01-03 MED ORDER — DEXAMETHASONE SODIUM PHOSPHATE 10 MG/ML IJ SOLN
8.0000 mg | Freq: Once | INTRAMUSCULAR | Status: AC
  Administered 2021-01-03: 8 mg via INTRAVENOUS

## 2021-01-03 MED ORDER — LACTATED RINGERS IV SOLN: INTRAVENOUS | Status: DC

## 2021-01-03 MED ORDER — PROPOFOL 500 MG/50ML IV EMUL
INTRAVENOUS | Status: DC | PRN
  Administered 2021-01-03: 40 ug/kg/min via INTRAVENOUS

## 2021-01-03 MED ORDER — LIDOCAINE 2% (20 MG/ML) 5 ML SYRINGE
INTRAMUSCULAR | Status: DC | PRN
  Administered 2021-01-03: 60 mg via INTRAVENOUS

## 2021-01-03 MED ORDER — BUPIVACAINE IN DEXTROSE 0.75-8.25 % IT SOLN
INTRATHECAL | Status: DC | PRN
  Administered 2021-01-03: 1.6 mL via INTRATHECAL

## 2021-01-03 MED ORDER — LIDOCAINE 2% (20 MG/ML) 5 ML SYRINGE
INTRAMUSCULAR | Status: AC
  Filled 2021-01-03: qty 5

## 2021-01-03 MED ORDER — HYDROMORPHONE HCL 1 MG/ML IJ SOLN: 0.2500 mg | INTRAMUSCULAR | Status: DC | PRN

## 2021-01-03 MED ORDER — ALBUTEROL SULFATE HFA 108 (90 BASE) MCG/ACT IN AERS: 2.0000 | INHALATION_SPRAY | Freq: Four times a day (QID) | RESPIRATORY_TRACT | Status: DC | PRN

## 2021-01-03 MED ORDER — SODIUM CHLORIDE 0.9 % IV SOLN: INTRAVENOUS | Status: DC

## 2021-01-03 MED ORDER — ONDANSETRON HCL 4 MG/2ML IJ SOLN: 4.0000 mg | Freq: Once | INTRAMUSCULAR | Status: DC | PRN

## 2021-01-03 MED ORDER — DEXAMETHASONE SODIUM PHOSPHATE 10 MG/ML IJ SOLN
INTRAMUSCULAR | Status: AC
  Filled 2021-01-03: qty 1

## 2021-01-03 MED ORDER — SODIUM CHLORIDE (PF) 0.9 % IJ SOLN
INTRAMUSCULAR | Status: DC | PRN
  Administered 2021-01-03: 60 mL

## 2021-01-03 MED ORDER — METHOCARBAMOL 500 MG PO TABS: 500.0000 mg | ORAL_TABLET | Freq: Four times a day (QID) | ORAL | Status: DC | PRN

## 2021-01-03 MED ORDER — FENTANYL CITRATE (PF) 100 MCG/2ML IJ SOLN
INTRAMUSCULAR | Status: AC
  Filled 2021-01-03: qty 2

## 2021-01-03 MED ORDER — SODIUM CHLORIDE 0.9 % IR SOLN
Status: DC | PRN
  Administered 2021-01-03: 1000 mL

## 2021-01-03 MED ORDER — PROPOFOL 10 MG/ML IV BOLUS
INTRAVENOUS | Status: AC
  Filled 2021-01-03: qty 40

## 2021-01-03 MED ORDER — GABAPENTIN 300 MG PO CAPS
300.0000 mg | ORAL_CAPSULE | Freq: Three times a day (TID) | ORAL | Status: DC
  Administered 2021-01-03 – 2021-01-04 (×4): 300 mg via ORAL
  Filled 2021-01-03 (×4): qty 1

## 2021-01-03 MED ORDER — METHOCARBAMOL 1000 MG/10ML IJ SOLN
500.0000 mg | Freq: Four times a day (QID) | INTRAMUSCULAR | Status: DC | PRN
  Filled 2021-01-03: qty 5

## 2021-01-03 MED ORDER — SODIUM CHLORIDE 0.9 % IV SOLN
6.2500 mg | Freq: Four times a day (QID) | INTRAVENOUS | Status: DC | PRN
  Filled 2021-01-03: qty 0.25

## 2021-01-03 MED ORDER — METOCLOPRAMIDE HCL 5 MG/ML IJ SOLN: 5.0000 mg | Freq: Three times a day (TID) | INTRAMUSCULAR | Status: DC | PRN

## 2021-01-03 MED ORDER — DEXAMETHASONE SODIUM PHOSPHATE 10 MG/ML IJ SOLN
10.0000 mg | Freq: Once | INTRAMUSCULAR | Status: AC
  Administered 2021-01-04: 10 mg via INTRAVENOUS
  Filled 2021-01-03: qty 1

## 2021-01-03 MED ORDER — DIPHENHYDRAMINE HCL 12.5 MG/5ML PO ELIX: 12.5000 mg | ORAL_SOLUTION | ORAL | Status: DC | PRN

## 2021-01-03 SURGICAL SUPPLY — 53 items
ATTUNE PS FEM RT SZ 4 CEM KNEE (Femur) ×1 IMPLANT
ATTUNE PSRP INSR SZ4 8 KNEE (Insert) ×1 IMPLANT
BAG SPEC THK2 15X12 ZIP CLS (MISCELLANEOUS) ×1
BAG ZIPLOCK 12X15 (MISCELLANEOUS) ×2 IMPLANT
BASEPLATE TIBIAL ROTATING SZ 4 (Knees) ×2 IMPLANT
BLADE SAG 18X100X1.27 (BLADE) ×2 IMPLANT
BLADE SAW SGTL 11.0X1.19X90.0M (BLADE) ×2 IMPLANT
BNDG ELASTIC 6X5.8 VLCR STR LF (GAUZE/BANDAGES/DRESSINGS) ×2 IMPLANT
BOWL SMART MIX CTS (DISPOSABLE) ×2 IMPLANT
BSPLAT TIB 4 CMNT ROT PLAT STR (Knees) ×1 IMPLANT
CEMENT HV SMART SET (Cement) ×4 IMPLANT
COVER SURGICAL LIGHT HANDLE (MISCELLANEOUS) ×2 IMPLANT
COVER WAND RF STERILE (DRAPES) IMPLANT
CUFF TOURN SGL QUICK 34 (TOURNIQUET CUFF) ×2
CUFF TRNQT CYL 34X4.125X (TOURNIQUET CUFF) ×1 IMPLANT
DECANTER SPIKE VIAL GLASS SM (MISCELLANEOUS) ×2 IMPLANT
DRAPE U-SHAPE 47X51 STRL (DRAPES) ×2 IMPLANT
DRSG AQUACEL AG ADV 3.5X10 (GAUZE/BANDAGES/DRESSINGS) ×2 IMPLANT
DURAPREP 26ML APPLICATOR (WOUND CARE) ×2 IMPLANT
ELECT REM PT RETURN 15FT ADLT (MISCELLANEOUS) ×2 IMPLANT
GLOVE SRG 8 PF TXTR STRL LF DI (GLOVE) ×1 IMPLANT
GLOVE SURG ENC MOIS LTX SZ6.5 (GLOVE) ×2 IMPLANT
GLOVE SURG ENC MOIS LTX SZ8 (GLOVE) ×4 IMPLANT
GLOVE SURG UNDER POLY LF SZ7 (GLOVE) ×2 IMPLANT
GLOVE SURG UNDER POLY LF SZ8 (GLOVE) ×2
GLOVE SURG UNDER POLY LF SZ8.5 (GLOVE) ×2 IMPLANT
GOWN STRL REUS W/TWL LRG LVL3 (GOWN DISPOSABLE) ×4 IMPLANT
GOWN STRL REUS W/TWL XL LVL3 (GOWN DISPOSABLE) ×2 IMPLANT
HANDPIECE INTERPULSE COAX TIP (DISPOSABLE) ×2
HOLDER FOLEY CATH W/STRAP (MISCELLANEOUS) IMPLANT
IMMOBILIZER KNEE 20 (SOFTGOODS) ×2
IMMOBILIZER KNEE 20 THIGH 36 (SOFTGOODS) ×1 IMPLANT
KIT TURNOVER KIT A (KITS) ×2 IMPLANT
MANIFOLD NEPTUNE II (INSTRUMENTS) ×2 IMPLANT
NS IRRIG 1000ML POUR BTL (IV SOLUTION) ×2 IMPLANT
PACK TOTAL KNEE CUSTOM (KITS) ×2 IMPLANT
PADDING CAST COTTON 6X4 STRL (CAST SUPPLIES) ×3 IMPLANT
PATELLA MEDIAL ATTUN 35MM KNEE (Knees) ×2 IMPLANT
PENCIL SMOKE EVACUATOR (MISCELLANEOUS) ×2 IMPLANT
PIN DRILL FIX HALF THREAD (BIT) ×1 IMPLANT
PIN STEINMAN FIXATION KNEE (PIN) ×1 IMPLANT
PROTECTOR NERVE ULNAR (MISCELLANEOUS) ×2 IMPLANT
SET HNDPC FAN SPRY TIP SCT (DISPOSABLE) ×1 IMPLANT
STRIP CLOSURE SKIN 1/2X4 (GAUZE/BANDAGES/DRESSINGS) ×4 IMPLANT
SUT MNCRL AB 4-0 PS2 18 (SUTURE) ×2 IMPLANT
SUT STRATAFIX 0 PDS 27 VIOLET (SUTURE) ×2
SUT VIC AB 2-0 CT1 27 (SUTURE) ×6
SUT VIC AB 2-0 CT1 TAPERPNT 27 (SUTURE) ×3 IMPLANT
SUTURE STRATFX 0 PDS 27 VIOLET (SUTURE) ×1 IMPLANT
TRAY FOLEY MTR SLVR 16FR STAT (SET/KITS/TRAYS/PACK) ×2 IMPLANT
TUBE SUCTION HIGH CAP CLEAR NV (SUCTIONS) ×2 IMPLANT
WATER STERILE IRR 1000ML POUR (IV SOLUTION) ×4 IMPLANT
WRAP KNEE MAXI GEL POST OP (GAUZE/BANDAGES/DRESSINGS) ×2 IMPLANT

## 2021-01-03 NOTE — Plan of Care (Signed)
Plan of care discussed with pt.

## 2021-01-03 NOTE — Anesthesia Procedure Notes (Signed)
Anesthesia Regional Block: Adductor canal block   Pre-Anesthetic Checklist: ,, timeout performed, Correct Patient, Correct Site, Correct Laterality, Correct Procedure, Correct Position, site marked, Risks and benefits discussed,  Surgical consent,  Pre-op evaluation,  At surgeon's request and post-op pain management  Laterality: Right  Prep: chloraprep       Needles:  Injection technique: Single-shot  Needle Type: Echogenic Needle     Needle Length: 9cm      Additional Needles:   Procedures:,,,, ultrasound used (permanent image in chart),,,,  Narrative:  Start time: 01/03/2021 6:54 AM End time: 01/03/2021 7:00 AM Injection made incrementally with aspirations every 5 mL.  Performed by: Personally  Anesthesiologist: Myrtie Soman, MD  Additional Notes: Patient tolerated the procedure well without complications

## 2021-01-03 NOTE — Op Note (Signed)
OPERATIVE REPORT-TOTAL KNEE ARTHROPLASTY   Pre-operative diagnosis- Osteoarthritis  Right knee(s)  Post-operative diagnosis- Osteoarthritis Right knee(s)  Procedure-  Right  Total Knee Arthroplasty  Surgeon- Dione Plover. Verl Whitmore, MD  Assistant- Theresa Duty, PA-C   Anesthesia-  Adductor canal block and spinal  EBL- 25 ml   Drains None  Tourniquet time- 30 minutes @300  mm Hg  Complications- None  Condition-PACU - hemodynamically stable.   Brief Clinical Note  Gina Wolfe is a 65 y.o. year old female with end stage OA of her right knee with progressively worsening pain and dysfunction. She has constant pain, with activity and at rest and significant functional deficits with difficulties even with ADLs. She has had extensive non-op management including analgesics, injections of cortisone and viscosupplements, and home exercise program, but remains in significant pain with significant dysfunction.Radiographs show bone on bone arthritis medial and patellofemoral. She presents now for right Total Knee Arthroplasty.    Procedure in detail---   The patient is brought into the operating room and positioned supine on the operating table. After successful administration of  Adductor canal block and spinal,   a tourniquet is placed high on the  Right thigh(s) and the lower extremity is prepped and draped in the usual sterile fashion. Time out is performed by the operating team and then the  Right lower extremity is wrapped in Esmarch, knee flexed and the tourniquet inflated to 300 mmHg.       A midline incision is made with a ten blade through the subcutaneous tissue to the level of the extensor mechanism. A fresh blade is used to make a medial parapatellar arthrotomy. Soft tissue over the proximal medial tibia is subperiosteally elevated to the joint line with a knife and into the semimembranosus bursa with a Cobb elevator. Soft tissue over the proximal lateral tibia is elevated with  attention being paid to avoiding the patellar tendon on the tibial tubercle. The patella is everted, knee flexed 90 degrees and the ACL and PCL are removed. Findings are bone on bone medial and patellofemoral with large global osteophytes.        The drill is used to create a starting hole in the distal femur and the canal is thoroughly irrigated with sterile saline to remove the fatty contents. The 5 degree Right  valgus alignment guide is placed into the femoral canal and the distal femoral cutting block is pinned to remove 9 mm off the distal femur. Resection is made with an oscillating saw.      The tibia is subluxed forward and the menisci are removed. The extramedullary alignment guide is placed referencing proximally at the medial aspect of the tibial tubercle and distally along the second metatarsal axis and tibial crest. The block is pinned to remove 66mm off the more deficient medial  side. Resection is made with an oscillating saw. Size 4is the most appropriate size for the tibia and the proximal tibia is prepared with the modular drill and keel punch for that size.      The femoral sizing guide is placed and size 4 is most appropriate. Rotation is marked off the epicondylar axis and confirmed by creating a rectangular flexion gap at 90 degrees. The size 4 cutting block is pinned in this rotation and the anterior, posterior and chamfer cuts are made with the oscillating saw. The intercondylar block is then placed and that cut is made.      Trial size 4 tibial component, trial size 4 posterior stabilized  femur and a 8  mm posterior stabilized rotating platform insert trial is placed. Full extension is achieved with excellent varus/valgus and anterior/posterior balance throughout full range of motion. The patella is everted and thickness measured to be 22  mm. Free hand resection is taken to 12 mm, a 35 template is placed, lug holes are drilled, trial patella is placed, and it tracks normally.  Osteophytes are removed off the posterior femur with the trial in place. All trials are removed and the cut bone surfaces prepared with pulsatile lavage. Cement is mixed and once ready for implantation, the size 4 tibial implant, size  4 posterior stabilized femoral component, and the size 35 patella are cemented in place and the patella is held with the clamp. The trial insert is placed and the knee held in full extension. The Exparel (20 ml mixed with 60 ml saline) is injected into the extensor mechanism, posterior capsule, medial and lateral gutters and subcutaneous tissues.  All extruded cement is removed and once the cement is hard the permanent 8 mm posterior stabilized rotating platform insert is placed into the tibial tray.      The wound is copiously irrigated with saline solution and the extensor mechanism closed with # 0 Stratofix suture. The tourniquet is released for a total tourniquet time of 30  minutes. Flexion against gravity is 140 degrees and the patella tracks normally. Subcutaneous tissue is closed with 2.0 vicryl and subcuticular with running 4.0 Monocryl. The incision is cleaned and dried and steri-strips and a bulky sterile dressing are applied. The limb is placed into a knee immobilizer and the patient is awakened and transported to recovery in stable condition.      Please note that a surgical assistant was a medical necessity for this procedure in order to perform it in a safe and expeditious manner. Surgical assistant was necessary to retract the ligaments and vital neurovascular structures to prevent injury to them and also necessary for proper positioning of the limb to allow for anatomic placement of the prosthesis.   Dione Plover Seriyah Collison, MD    01/03/2021, 8:21 AM

## 2021-01-03 NOTE — Anesthesia Procedure Notes (Signed)
Spinal  Patient location during procedure: OR Start time: 01/03/2021 7:14 AM End time: 01/03/2021 7:18 AM Reason for block: surgical anesthesia Staffing Performed: anesthesiologist  Anesthesiologist: Myrtie Soman, MD Preanesthetic Checklist Completed: patient identified, IV checked, site marked, risks and benefits discussed, surgical consent, monitors and equipment checked, pre-op evaluation and timeout performed Spinal Block Patient position: sitting Prep: Betadine Patient monitoring: heart rate, continuous pulse ox and blood pressure Approach: midline Location: L3-4 Injection technique: single-shot Needle Needle type: Sprotte  Needle gauge: 24 G Needle length: 9 cm Assessment Sensory level: T6 Events: CSF return Additional Notes Expiration date of kit checked and confirmed. Patient tolerated procedure well, without complications.

## 2021-01-03 NOTE — Anesthesia Postprocedure Evaluation (Signed)
Anesthesia Post Note  Patient: Gina Wolfe  Procedure(s) Performed: TOTAL KNEE ARTHROPLASTY (Right Knee)     Patient location during evaluation: PACU Anesthesia Type: Spinal Level of consciousness: oriented and awake and alert Pain management: pain level controlled Vital Signs Assessment: post-procedure vital signs reviewed and stable Respiratory status: spontaneous breathing, respiratory function stable and patient connected to nasal cannula oxygen Cardiovascular status: blood pressure returned to baseline and stable Postop Assessment: no headache, no backache and no apparent nausea or vomiting Anesthetic complications: no   No complications documented.  Last Vitals:  Vitals:   01/03/21 1038 01/03/21 1244  BP: 120/76 140/73  Pulse: (!) 57 75  Resp: 18 16  Temp: 36.5 C 36.7 C  SpO2: 99% 97%    Last Pain:  Vitals:   01/03/21 1244  TempSrc: Oral  PainSc:                  Jadia Capers S

## 2021-01-03 NOTE — Anesthesia Procedure Notes (Signed)
Anesthesia Procedure Image    

## 2021-01-03 NOTE — Interval H&P Note (Signed)
History and Physical Interval Note:  01/03/2021 6:38 AM  Gina Wolfe  has presented today for surgery, with the diagnosis of right knee osteoarthritis.  The various methods of treatment have been discussed with the patient and family. After consideration of risks, benefits and other options for treatment, the patient has consented to  Procedure(s) with comments: TOTAL KNEE ARTHROPLASTY (Right) - 65min as a surgical intervention.  The patient's history has been reviewed, patient examined, no change in status, stable for surgery.  I have reviewed the patient's chart and labs.  Questions were answered to the patient's satisfaction.     Pilar Plate Gina Wolfe

## 2021-01-03 NOTE — Transfer of Care (Signed)
Immediate Anesthesia Transfer of Care Note  Patient: Gina Wolfe  Procedure(s) Performed: TOTAL KNEE ARTHROPLASTY (Right Knee)  Patient Location: PACU  Anesthesia Type:Spinal  Level of Consciousness: awake, alert  and patient cooperative  Airway & Oxygen Therapy: Patient Spontanous Breathing and Patient connected to face mask oxygen  Post-op Assessment: Report given to RN and Post -op Vital signs reviewed and stable  Post vital signs: Reviewed and stable  Last Vitals:  Vitals Value Taken Time  BP 94/63 01/03/21 0845  Temp    Pulse 80 01/03/21 0849  Resp 11 01/03/21 0849  SpO2 99 % 01/03/21 0849  Vitals shown include unvalidated device data.  Last Pain:  Vitals:   01/03/21 0541  TempSrc: Oral  PainSc:       Patients Stated Pain Goal: 4 (20/94/70 9628)  Complications: No complications documented.

## 2021-01-03 NOTE — Discharge Instructions (Signed)
 Frank Aluisio, MD Total Joint Specialist [facility] [place] [address] [place], [place] [address] [phone]  TOTAL KNEE REPLACEMENT POSTOPERATIVE DIRECTIONS    Knee Rehabilitation, Guidelines Following Surgery  Results after knee surgery are often greatly improved when you follow the exercise, range of motion and muscle strengthening exercises prescribed by your doctor. Safety measures are also important to protect the knee from further injury. If any of these exercises cause you to have increased pain or swelling in your knee joint, decrease the amount until you are comfortable again and slowly increase them. If you have problems or questions, call your caregiver or physical therapist for advice.   BLOOD CLOT PREVENTION . Take a 325 mg Aspirin two times a day for three weeks following surgery. Then take an 81 mg Aspirin once a day for three weeks. Then discontinue Aspirin. . You may resume your vitamins/supplements upon discharge from the hospital. . Do not take any NSAIDs (Advil, Aleve, Ibuprofen, Meloxicam, etc.) until you have discontinued the 325 mg Aspirin.  HOME CARE INSTRUCTIONS  . Remove items at home which could result in a fall. This includes throw rugs or furniture in walking pathways.  . ICE to the affected knee as much as tolerated. Icing helps control swelling. If the swelling is well controlled you will be more comfortable and rehab easier. Continue to use ice on the knee for pain and swelling from surgery. You may notice swelling that will progress down to the foot and ankle. This is normal after surgery. Elevate the leg when you are not up walking on it.    . Continue to use the breathing machine which will help keep your temperature down. It is common for your temperature to cycle up and down following surgery, especially at night when you are not up moving around and exerting yourself. The breathing machine keeps your lungs expanded and your  temperature down. . Do not place pillow under the operative knee, focus on keeping the knee straight while resting  DIET You may resume your previous home diet once you are discharged from the hospital.  DRESSING / WOUND CARE / SHOWERING . Keep your bulky bandage on for 2 days. On the third post-operative day you may remove the Ace bandage and gauze. There is a waterproof adhesive bandage on your skin which will stay in place until your first follow-up appointment. Once you remove this you will not need to place another bandage . You may begin showering 3 days following surgery, but do not submerge the incision under water.  ACTIVITY For the first 5 days, the key is rest and control of pain and swelling . Do your home exercises twice a day starting on post-operative day 3. On the days you go to physical therapy, just do the home exercises once that day. . You should rest, ice and elevate the leg for 50 minutes out of every hour. Get up and walk/stretch for 10 minutes per hour. After 5 days you can increase your activity slowly as tolerated. . Walk with your walker as instructed. Use the walker until you are comfortable transitioning to a cane. Walk with the cane in the opposite hand of the operative leg. You may discontinue the cane once you are comfortable and walking steadily. . Avoid periods of inactivity such as sitting longer than an hour when not asleep. This helps prevent blood clots.  . You may discontinue the knee immobilizer once you are able to perform a straight   leg raise while lying down. . You may resume a sexual relationship in one month or when given the OK by your doctor.  . You may return to work once you are cleared by your doctor.  . Do not drive a car for 6 weeks or until released by your surgeon.  . Do not drive while taking narcotics.  TED HOSE STOCKINGS Wear the elastic stockings on both legs for three weeks following surgery during the day. You may remove them at night  for sleeping.  WEIGHT BEARING Weight bearing as tolerated with assist device (walker, cane, etc) as directed, use it as long as suggested by your surgeon or therapist, typically at least 4-6 weeks.  POSTOPERATIVE CONSTIPATION PROTOCOL Constipation - defined medically as fewer than three stools per week and severe constipation as less than one stool per week.  One of the most common issues patients have following surgery is constipation.  Even if you have a regular bowel pattern at home, your normal regimen is likely to be disrupted due to multiple reasons following surgery.  Combination of anesthesia, postoperative narcotics, change in appetite and fluid intake all can affect your bowels.  In order to avoid complications following surgery, here are some recommendations in order to help you during your recovery period.  . Colace (docusate) - Pick up an over-the-counter form of Colace or another stool softener and take twice a day as long as you are requiring postoperative pain medications.  Take with a full glass of water daily.  If you experience loose stools or diarrhea, hold the colace until you stool forms back up. If your symptoms do not get better within 1 week or if they get worse, check with your doctor. . Dulcolax (bisacodyl) - Pick up over-the-counter and take as directed by the product packaging as needed to assist with the movement of your bowels.  Take with a full glass of water.  Use this product as needed if not relieved by Colace only.  . MiraLax (polyethylene glycol) - Pick up over-the-counter to have on hand. MiraLax is a solution that will increase the amount of water in your bowels to assist with bowel movements.  Take as directed and can mix with a glass of water, juice, soda, coffee, or tea. Take if you go more than two days without a movement. Do not use MiraLax more than once per day. Call your doctor if you are still constipated or irregular after using this medication for 7 days  in a row.  If you continue to have problems with postoperative constipation, please contact the office for further assistance and recommendations.  If you experience "the worst abdominal pain ever" or develop nausea or vomiting, please contact the office immediatly for further recommendations for treatment.  ITCHING If you experience itching with your medications, try taking only a single pain pill, or even half a pain pill at a time.  You can also use Benadryl over the counter for itching or also to help with sleep.   MEDICATIONS See your medication summary on the "After Visit Summary" that the nursing staff will review with you prior to discharge.  You may have some home medications which will be placed on hold until you complete the course of blood thinner medication.  It is important for you to complete the blood thinner medication as prescribed by your surgeon.  Continue your approved medications as instructed at time of discharge.  PRECAUTIONS . If you experience chest pain or shortness of   breath - call 911 immediately for transfer to the hospital emergency department.  . If you develop a fever greater that 101 F, purulent drainage from wound, increased redness or drainage from wound, foul odor from the wound/dressing, or calf pain - CONTACT YOUR SURGEON.                                                   FOLLOW-UP APPOINTMENTS Make sure you keep all of your appointments after your operation with your surgeon and caregivers. You should call the office at the above phone number and make an appointment for approximately two weeks after the date of your surgery or on the date instructed by your surgeon outlined in the "After Visit Summary".  RANGE OF MOTION AND STRENGTHENING EXERCISES  Rehabilitation of the knee is important following a knee injury or an operation. After just a few days of immobilization, the muscles of the thigh which control the knee become weakened and shrink (atrophy). Knee  exercises are designed to build up the tone and strength of the thigh muscles and to improve knee motion. Often times heat used for twenty to thirty minutes before working out will loosen up your tissues and help with improving the range of motion but do not use heat for the first two weeks following surgery. These exercises can be done on a training (exercise) mat, on the floor, on a table or on a bed. Use what ever works the best and is most comfortable for you Knee exercises include:  . Leg Lifts - While your knee is still immobilized in a splint or cast, you can do straight leg raises. Lift the leg to 60 degrees, hold for 3 sec, and slowly lower the leg. Repeat 10-20 times 2-3 times daily. Perform this exercise against resistance later as your knee gets better.  Javier Docker and Hamstring Sets - Tighten up the muscle on the front of the thigh (Quad) and hold for 5-10 sec. Repeat this 10-20 times hourly. Hamstring sets are done by pushing the foot backward against an object and holding for 5-10 sec. Repeat as with quad sets.   Leg Slides: Lying on your back, slowly slide your foot toward your buttocks, bending your knee up off the floor (only go as far as is comfortable). Then slowly slide your foot back down until your leg is flat on the floor again.  Angel Wings: Lying on your back spread your legs to the side as far apart as you can without causing discomfort.  A rehabilitation program following serious knee injuries can speed recovery and prevent re-injury in the future due to weakened muscles. Contact your doctor or a physical therapist for more information on knee rehabilitation.   POST-OPERATIVE OPIOID TAPER INSTRUCTIONS: . It is important to wean off of your opioid medication as soon as possible. If you do not need pain medication after your surgery it is ok to stop day one. Marland Kitchen Opioids include: o Codeine, Hydrocodone(Norco, Vicodin), Oxycodone(Percocet, oxycontin) and hydromorphone amongst others.   . Long term and even short term use of opiods can cause: o Increased pain response o Dependence o Constipation o Depression o Respiratory depression o And more.  . Withdrawal symptoms can include o Flu like symptoms o Nausea, vomiting o And more . Techniques to manage these symptoms o Hydrate well o Eat regular  healthy meals o Stay active o Use relaxation techniques(deep breathing, meditating, yoga) . Do Not substitute Alcohol to help with tapering . If you have been on opioids for less than two weeks and do not have pain than it is ok to stop all together.  . Plan to wean off of opioids o This plan should start within one week post op of your joint replacement. o Maintain the same interval or time between taking each dose and first decrease the dose.  o Cut the total daily intake of opioids by one tablet each day o Next start to increase the time between doses. o The last dose that should be eliminated is the evening dose.     IF YOU ARE TRANSFERRED TO A SKILLED REHAB FACILITY If the patient is transferred to a skilled rehab facility following release from the hospital, a list of the current medications will be sent to the facility for the patient to continue.  When discharged from the skilled rehab facility, please have the facility set up the patient's [place] prior to being released. Also, the skilled facility will be responsible for providing the patient with their medications at time of release from the facility to include their pain medication, the muscle relaxants, and their blood thinner medication. If the patient is still at the rehab facility at time of the two week follow up appointment, the skilled rehab facility will also need to assist the patient in arranging follow up appointment in our office and any transportation needs.  MAKE SURE YOU:  . Understand these instructions.  . Get help right away if you are not doing well or get worse.   DENTAL  ANTIBIOTICS:  In most cases prophylactic antibiotics for Dental procdeures after total joint surgery are not necessary.  Exceptions are as follows:  1. History of prior total joint infection  2. Severely immunocompromised (Organ Transplant, cancer chemotherapy, Rheumatoid biologic meds such as [place])  3. Poorly controlled diabetes (A1C &gt; 8.0, blood glucose over 200)  If you have one of these conditions, contact your surgeon for an antibiotic prescription, prior to your dental procedure.    Pick up stool softner and laxative for home use following surgery while on pain medications. Do not submerge incision under water. Please use good hand washing techniques while changing dressing each day. May shower starting three days after surgery. Please use a clean towel to pat the incision dry following showers. Continue to use ice for pain and swelling after surgery. Do not use any lotions or creams on the incision until instructed by your surgeon.

## 2021-01-03 NOTE — Care Plan (Signed)
Ortho Bundle Case Management Note  Patient Details  Name: Gina Wolfe MRN: [id number] Date of Birth: 25-Aug-1956  R TKA on 01-03-21 DCP:  Home with husband.  1 story home with 5 ste. DME:  RW and 3-in-1 ordered through [facility]. PT:  [facility]                    DME Arranged:  [facility] DME Agency:  [facility]  HH Arranged:  NA HH Agency:  NA  Additional Comments: Please contact me with any questions of if this plan should need to change.  Marianne Sofia, RN,CCM [facility]  [phone] 01/03/2021, 1:27 PM

## 2021-01-03 NOTE — Anesthesia Procedure Notes (Signed)
Procedure Name: MAC Date/Time: 01/03/2021 7:12 AM Performed by: Eben Burow, CRNA Pre-anesthesia Checklist: Patient identified, Emergency Drugs available, Suction available, Patient being monitored and Timeout performed Oxygen Delivery Method: Simple face mask Placement Confirmation: positive ETCO2

## 2021-01-03 NOTE — Progress Notes (Signed)
Orthopedic Tech Progress Note Patient Details:  Gina Wolfe Dec 22, 1955 [id number]  CPM Right Knee CPM Right Knee: On Right Knee Flexion (Degrees): 40 Right Knee Extension (Degrees): 10  Post Interventions Patient Tolerated: Well Instructions Provided: Care of device  Gina Wolfe 01/03/2021, 9:17 AM

## 2021-01-03 NOTE — Evaluation (Signed)
Physical Therapy Evaluation Patient Details Name: Gina Wolfe MRN: [id number] DOB: November 07, 1955 Today's Date: 01/03/2021   History of Present Illness  Patient is 65 y.o. female s/p Rt TKA on 01/03/21 with PM significant for OA, GERD, Asthma, chroinc pain, back surgery.  Clinical Impression  Gina Wolfe is a 66 y.o. female POD 0 s/p Rt TKA. Patient reports independence with mobility at baseline. Patient is now limited by functional impairments (see PT problem list below) and requires min assist for transfers and gait with RW. Patient was able to ambulate ~8 feet with RW and min assist and was limited by increased nausea with mobility. RN notified. Patient instructed in exercise to facilitate circulation to manage edema and reduce risk of DVT. Patient will benefit from continued skilled PT interventions to address impairments and progress towards PLOF. Acute PT will follow to progress mobility and stair training in preparation for safe discharge home.     Follow Up Recommendations Follow surgeon's recommendation for DC plan and follow-up therapies;Outpatient PT    Equipment Recommendations  Rolling walker with 5" wheels;3in1 (PT)    Recommendations for Other Services       Precautions / Restrictions Precautions Precautions: Fall Restrictions Weight Bearing Restrictions: No Other Position/Activity Restrictions: WBAT      Mobility  Bed Mobility Overal bed mobility: Needs Assistance Bed Mobility: Supine to Sit     Supine to sit: HOB elevated;Min guard     General bed mobility comments: Cues for use of bed rail and pt taking extra time to bring LE's off EOB. pt reported some nausea and dizziness, BP 136/73 mmHg.    Transfers Overall transfer level: Needs assistance Equipment used: Rolling walker (2 wheeled) Transfers: Sit to/from Stand Sit to Stand: Min assist         General transfer comment: Cues for hand placement and assist for power  up.  Ambulation/Gait Ambulation/Gait assistance: Min assist Gait Distance (Feet): 8 Feet Assistive device: Rolling walker (2 wheeled) Gait Pattern/deviations: Step-to pattern;Decreased stride length;Decreased weight shift to right Gait velocity: decr   General Gait Details: Cues for step to pattern and assist to manage walker position. No overt LOB and pt with good use of UE's to prevent buckling on Rt knee. distance limited by increased nausea. BP 140/73 mmHg seated after gait.  Stairs            Wheelchair Mobility    Modified Rankin (Stroke Patients Only)       Balance Overall balance assessment: Needs assistance Sitting-balance support: Feet supported Sitting balance-Leahy Scale: Good     Standing balance support: During functional activity;Bilateral upper extremity supported Standing balance-Leahy Scale: Poor                               Pertinent Vitals/Pain Pain Assessment: 0-10 Pain Score: 4  Pain Location: Rt Knee Pain Descriptors / Indicators: Aching;Discomfort Pain Intervention(s): Limited activity within patient's tolerance;Monitored during session;Premedicated before session;Repositioned;Ice applied    Home Living Family/patient expects to be discharged to:: Private residence Living Arrangements: Spouse/significant other;Children Available Help at Discharge: Family Type of Home: House Home Access: Stairs to enter Entrance Stairs-Rails: Right;Left;Can reach both Technical brewer of Steps: 4 Home Layout: One level Home Equipment: None      Prior Function Level of Independence: Independent               Hand Dominance   Dominant Hand: Right    Extremity/Trunk Assessment  Upper Extremity Assessment Upper Extremity Assessment: Overall WFL for tasks assessed    Lower Extremity Assessment Lower Extremity Assessment: RLE deficits/detail RLE Deficits / Details: good quad activation, no extensor lag with SLR. RLE  Sensation: WNL RLE Coordination: WNL    Cervical / Trunk Assessment Cervical / Trunk Assessment: Normal  Communication   Communication: No difficulties  Cognition Arousal/Alertness: Awake/alert Behavior During Therapy: WFL for tasks assessed/performed Overall Cognitive Status: Within Functional Limits for tasks assessed                                        General Comments      Exercises Total Joint Exercises Ankle Circles/Pumps: AROM;Both;15 reps;Seated   Assessment/Plan    PT Assessment Patient needs continued PT services  PT Problem List Decreased strength;Decreased range of motion;Decreased activity tolerance;Decreased balance;Decreased mobility;Decreased knowledge of use of DME;Decreased knowledge of precautions;Decreased safety awareness;Pain       PT Treatment Interventions DME instruction;Gait training;Stair training;Functional mobility training;Therapeutic activities;Therapeutic exercise;Balance training;Patient/family education    PT Goals (Current goals can be found in the Care Plan section)  Acute Rehab PT Goals Patient Stated Goal: get back to traveling and caring for her son. PT Goal Formulation: With patient Time For Goal Achievement: 01/10/21 Potential to Achieve Goals: Good    Frequency 7X/week   Barriers to discharge        Co-evaluation               AM-PAC PT "6 Clicks" Mobility  Outcome Measure Help needed turning from your back to your side while in a flat bed without using bedrails?: A Little Help needed moving from lying on your back to sitting on the side of a flat bed without using bedrails?: A Little Help needed moving to and from a bed to a chair (including a wheelchair)?: A Little Help needed standing up from a chair using your arms (e.g., wheelchair or bedside chair)?: A Little Help needed to walk in hospital room?: A Little Help needed climbing 3-5 steps with a railing? : A Little 6 Click Score: 18    End  of Session Equipment Utilized During Treatment: Gait belt Activity Tolerance: Patient tolerated treatment well (limited by nausea) Patient left: in chair;with call bell/phone within reach;with chair alarm set;Other (comment) (RN notified of nausea meds needed) Nurse Communication: Mobility status PT Visit Diagnosis: Muscle weakness (generalized) (M62.81);Difficulty in walking, not elsewhere classified (R26.2)    Time: 2841-3244 PT Time Calculation (min) (ACUTE ONLY): 32 min   Charges:   PT Evaluation $PT Eval Low Complexity: 1 Low PT Treatments $Gait Training: 8-22 mins        Verner Mould, DPT Acute Rehabilitation Services Office [phone] Pager [phone]    Jacques Navy 01/03/2021, 2:01 PM

## 2021-01-03 NOTE — Anesthesia Preprocedure Evaluation (Signed)
Anesthesia Evaluation  Patient identified by MRN, date of birth, ID band Patient awake    Reviewed: Allergy & Precautions, NPO status , Patient's Chart, lab work & pertinent test results  History of Anesthesia Complications (+) PONV  Airway Mallampati: II  TM Distance: >3 FB Neck ROM: Full    Dental no notable dental hx.    Pulmonary neg pulmonary ROS, former smoker,    Pulmonary exam normal breath sounds clear to auscultation       Cardiovascular negative cardio ROS Normal cardiovascular exam Rhythm:Regular Rate:Normal     Neuro/Psych negative neurological ROS  negative psych ROS   GI/Hepatic Neg liver ROS, GERD  ,  Endo/Other  negative endocrine ROS  Renal/GU negative Renal ROS  negative genitourinary   Musculoskeletal negative musculoskeletal ROS (+)   Abdominal   Peds negative pediatric ROS (+)  Hematology negative hematology ROS (+)   Anesthesia Other Findings   Reproductive/Obstetrics negative OB ROS                             Anesthesia Physical Anesthesia Plan  ASA: II  Anesthesia Plan: Spinal   Post-op Pain Management:  Regional for Post-op pain   Induction: Intravenous  PONV Risk Score and Plan: 3 and Ondansetron, Dexamethasone, Midazolam and Treatment may vary due to age or medical condition  Airway Management Planned: Simple Face Mask  Additional Equipment:   Intra-op Plan:   Post-operative Plan:   Informed Consent: I have reviewed the patients History and Physical, chart, labs and discussed the procedure including the risks, benefits and alternatives for the proposed anesthesia with the patient or authorized representative who has indicated his/her understanding and acceptance.     Dental advisory given  Plan Discussed with: CRNA and Surgeon  Anesthesia Plan Comments:         Anesthesia Quick Evaluation

## 2021-01-04 ENCOUNTER — Encounter (HOSPITAL_COMMUNITY): Payer: Self-pay | Admitting: Orthopedic Surgery

## 2021-01-04 DIAGNOSIS — M1711 Unilateral primary osteoarthritis, right knee: Secondary | ICD-10-CM | POA: Diagnosis not present

## 2021-01-04 DIAGNOSIS — M25561 Pain in right knee: Secondary | ICD-10-CM | POA: Diagnosis not present

## 2021-01-04 DIAGNOSIS — Z87891 Personal history of nicotine dependence: Secondary | ICD-10-CM | POA: Diagnosis not present

## 2021-01-04 DIAGNOSIS — J45909 Unspecified asthma, uncomplicated: Secondary | ICD-10-CM | POA: Diagnosis not present

## 2021-01-04 LAB — BASIC METABOLIC PANEL
Anion gap: 7 (ref 5–15)
BUN: 8 mg/dL (ref 8–23)
CO2: 23 mmol/L (ref 22–32)
Calcium: 8.4 mg/dL — ABNORMAL LOW (ref 8.9–10.3)
Chloride: 108 mmol/L (ref 98–111)
Creatinine, Ser: 0.64 mg/dL (ref 0.44–1.00)
GFR, Estimated: >60 mL/min (ref >60)
Glucose, Bld: 113 mg/dL — ABNORMAL HIGH (ref 70–99)
Potassium: 4.2 mmol/L (ref 3.5–5.1)
Sodium: 138 mmol/L (ref 135–145)

## 2021-01-04 LAB — CBC
HCT: 37.6 % (ref 36.0–46.0)
Hemoglobin: 12.1 g/dL (ref 12.0–15.0)
MCH: 30.9 pg (ref 26.0–34.0)
MCHC: 32.2 g/dL (ref 30.0–36.0)
MCV: 95.9 fL (ref 80.0–100.0)
Platelets: 263 10*3/uL (ref 150–400)
RBC: 3.92 MIL/uL (ref 3.87–5.11)
RDW: 12.9 % (ref 11.5–15.5)
WBC: 11.9 10*3/uL — ABNORMAL HIGH (ref 4.0–10.5)
nRBC: 0 % (ref 0.0–0.2)

## 2021-01-04 MED ORDER — GABAPENTIN 300 MG PO CAPS: ORAL_CAPSULE | ORAL | 0 refills | Status: DC

## 2021-01-04 MED ORDER — METHOCARBAMOL 500 MG PO TABS: 500.0000 mg | ORAL_TABLET | Freq: Four times a day (QID) | ORAL | 0 refills | Status: DC | PRN

## 2021-01-04 MED ORDER — TRAMADOL HCL 50 MG PO TABS: 50.0000 mg | ORAL_TABLET | Freq: Four times a day (QID) | ORAL | 0 refills | Status: DC | PRN

## 2021-01-04 MED ORDER — ASPIRIN 325 MG PO TBEC: 325.0000 mg | DELAYED_RELEASE_TABLET | Freq: Two times a day (BID) | ORAL | 0 refills | Status: AC

## 2021-01-04 MED ORDER — HYDROMORPHONE HCL 2 MG PO TABS: 2.0000 mg | ORAL_TABLET | Freq: Four times a day (QID) | ORAL | 0 refills | Status: DC | PRN

## 2021-01-04 MED ORDER — FLUCONAZOLE 150 MG PO TABS
150.0000 mg | ORAL_TABLET | Freq: Once | ORAL | Status: AC
  Administered 2021-01-04: 150 mg via ORAL
  Filled 2021-01-04: qty 1

## 2021-01-04 NOTE — Progress Notes (Signed)
   Subjective: 1 Day Post-Op Procedure(s) (LRB): TOTAL KNEE ARTHROPLASTY (Right) Patient reports pain as mild.   Patient seen in rounds by Dr. Wynelle Link. Patient is well, and has had no acute complaints or problems. No issues overnight. Denies chest pain, SOB, or calf pain. Foley catheter to be removed this AM. Reports history of yeast infections with taking antibiotics. Will give one dose 150 mg fluconazole today.  We will continue therapy today.   Objective: Vital signs in last 24 hours: Temp:  [97.6 F (36.4 C)-98.2 F (36.8 C)] 97.8 F (36.6 C) (04/26 0536) Pulse Rate:  [57-79] 61 (04/26 0536) Resp:  [10-20] 16 (04/26 0536) BP: (94-140)/(61-95) 114/95 (04/26 0536) SpO2:  [94 %-100 %] 98 % (04/26 0536)  Intake/Output from previous day:  Intake/Output Summary (Last 24 hours) at 01/04/2021 0702 Last data filed at 01/04/2021 0600 Gross per 24 hour  Intake 4135.38 ml  Output 2550 ml  Net 1585.38 ml     Intake/Output this shift: No intake/output data recorded.  Labs: Recent Labs    01/04/21 0314  HGB 12.1   Recent Labs    01/04/21 0314  WBC 11.9*  RBC 3.92  HCT 37.6  PLT 263   Recent Labs    01/04/21 0314  NA 138  K 4.2  CL 108  CO2 23  BUN 8  CREATININE 0.64  GLUCOSE 113*  CALCIUM 8.4*   No results for input(s): LABPT, INR in the last 72 hours.  Exam: General - Patient is Alert and Oriented Extremity - Neurologically intact Neurovascular intact Sensation intact distally Dorsiflexion/Plantar flexion intact Dressing - dressing C/D/I Motor Function - intact, moving foot and toes well on exam.   Past Medical History:  Diagnosis Date  . Arthritis   . Asthma   . Chronic pain syndrome   . Diverticulitis   . GERD (gastroesophageal reflux disease)   . IBS (irritable bowel syndrome)   . PONV (postoperative nausea and vomiting)   . Spinal headache     Assessment/Plan: 1 Day Post-Op Procedure(s) (LRB): TOTAL KNEE ARTHROPLASTY (Right) Principal  Problem:   OA (osteoarthritis) of knee Active Problems:   Primary osteoarthritis of right knee  Estimated body mass index is 27.25 kg/m as calculated from the following:   Height as of this encounter: 5\' 2"  (1.575 m).   Weight as of this encounter: 67.6 kg. Advance diet Up with therapy D/C IV fluids   Patient's anticipated LOS is less than 2 midnights, meeting these requirements: - Younger than 58 - Lives within 1 hour of care - Has a competent adult at home to recover with post-op recover - NO history of  - Diabetes  - Coronary Artery Disease  - Heart failure  - Heart attack  - Stroke  - DVT/VTE  - Cardiac arrhythmia  - Respiratory Failure/COPD  - Renal failure  - Anemia  - Advanced Liver disease  DVT Prophylaxis - Aspirin Weight bearing as tolerated. Continue therapy.  Plan is to go Home after hospital stay. Plan for discharge later today if progresses with therapy and meeting her goals. Scheduled for OPPT Follow-up in the office in 2 weeks  The PDMP database was reviewed today prior to any opioid medications being prescribed to this patient.  Theresa Duty, PA-C Orthopedic Surgery [phone] 01/04/2021, 7:02 AM

## 2021-01-04 NOTE — Progress Notes (Signed)
Physical Therapy Treatment Patient Details Name: Gina Wolfe MRN: [id number] DOB: August 23, 1956 Today's Date: 01/04/2021    History of Present Illness Patient is 65 y.o. female s/p Rt TKA on 01/03/21 with PM significant for OA, GERD, Asthma, chroinc pain, back surgery.    PT Comments    Pt ambulated 150' with RW, no loss of balance. Stair training completed. Pt demonstrates good understanding of HEP. From PT standpoint, she is ready to DC home.   Follow Up Recommendations  Follow surgeon's recommendation for DC plan and follow-up therapies;Outpatient PT     Equipment Recommendations  Rolling walker with 5" wheels;3in1 (PT)    Recommendations for Other Services       Precautions / Restrictions Precautions Precautions: Fall;Knee Precaution Booklet Issued: Yes (comment) Precaution Comments: instructed pt in no pillow under knee Restrictions Weight Bearing Restrictions: No Other Position/Activity Restrictions: WBAT    Mobility  Bed Mobility Overal bed mobility: Modified Independent Bed Mobility: Supine to Sit           General bed mobility comments: used rail    Transfers Overall transfer level: Needs assistance Equipment used: Rolling walker (2 wheeled) Transfers: Sit to/from Stand Sit to Stand: Supervision         General transfer comment: Cues for hand placement  Ambulation/Gait Ambulation/Gait assistance: Supervision Gait Distance (Feet): 150 Feet Assistive device: Standard walker Gait Pattern/deviations: Step-to pattern;Decreased stride length;Decreased weight shift to right Gait velocity: decr   General Gait Details: steady, no loss of balance   Stairs Stairs: Yes Stairs assistance: Min guard Stair Management: Two rails;Step to pattern;Forwards Number of Stairs: 3 General stair comments: VCs sequencing   Wheelchair Mobility    Modified Rankin (Stroke Patients Only)       Balance Overall balance assessment: Needs  assistance Sitting-balance support: Feet supported Sitting balance-Leahy Scale: Good     Standing balance support: During functional activity;Bilateral upper extremity supported Standing balance-Leahy Scale: Fair                              Cognition Arousal/Alertness: Awake/alert Behavior During Therapy: WFL for tasks assessed/performed Overall Cognitive Status: Within Functional Limits for tasks assessed                                        Exercises Total Joint Exercises Ankle Circles/Pumps: AROM;Both;15 reps;Seated Quad Sets: AROM;Right;5 reps;Supine Short Arc Quad: AROM;Right;5 reps;Supine Heel Slides: AAROM;Right;5 reps;Supine Hip ABduction/ADduction: AAROM;Right;5 reps;Supine Straight Leg Raises: AAROM;Right;5 reps;Supine Long Arc Quad: AROM;Right;5 reps;Seated Knee Flexion: AAROM;Right;10 reps;Seated Goniometric ROM: 0-55* AAROM R knee    General Comments        Pertinent Vitals/Pain Pain Score: 8  Pain Location: Rt Knee Pain Descriptors / Indicators: Aching;Discomfort Pain Intervention(s): Limited activity within patient's tolerance;Monitored during session;Premedicated before session;Ice applied;Repositioned    Home Living                      Prior Function            PT Goals (current goals can now be found in the care plan section) Acute Rehab PT Goals Patient Stated Goal: get back to traveling and caring for her son. PT Goal Formulation: With patient Time For Goal Achievement: 01/10/21 Potential to Achieve Goals: Good Progress towards PT goals: Goals met/education completed, patient discharged from PT  Frequency    7X/week      PT Plan Current plan remains appropriate    Co-evaluation              AM-PAC PT "6 Clicks" Mobility   Outcome Measure  Help needed turning from your back to your side while in a flat bed without using bedrails?: A Little Help needed moving from lying on your back  to sitting on the side of a flat bed without using bedrails?: A Little Help needed moving to and from a bed to a chair (including a wheelchair)?: A Little Help needed standing up from a chair using your arms (e.g., wheelchair or bedside chair)?: A Little Help needed to walk in hospital room?: A Little Help needed climbing 3-5 steps with a railing? : A Little 6 Click Score: 18    End of Session Equipment Utilized During Treatment: Gait belt Activity Tolerance: Patient tolerated treatment well (limited by nausea) Patient left: in chair;with call bell/phone within reach;with chair alarm set Nurse Communication: Mobility status PT Visit Diagnosis: Muscle weakness (generalized) (M62.81);Difficulty in walking, not elsewhere classified (R26.2)     Time: 9311-2162 PT Time Calculation (min) (ACUTE ONLY): 43 min  Charges:  $Gait Training: 8-22 mins $Therapeutic Exercise: 8-22 mins $Therapeutic Activity: 8-22 mins                     Blondell Reveal Kistler PT 01/04/2021  Acute Rehabilitation Services Pager [phone] Office [phone]

## 2021-01-04 NOTE — TOC Transition Note (Signed)
Transition of Care [facility]) - CM/SW Discharge Note   Patient Details  Name: Gina Wolfe MRN: [id number] Date of Birth: 09/26/1955  [facility] CM/SW Contact:  Lennart Pall, LCSW Phone Number: 01/04/2021, 10:40 AM   Clinical Narrative:    Met briefly with pt and confirming receipt of DME via [facility] and plan for OPPT @ [facility].  No further TOC needs.   Final next level of care: OP Rehab Barriers to Discharge: No Barriers Identified   Patient Goals and CMS Choice Patient states their goals for this hospitalization and ongoing recovery are:: return home      Discharge Placement                       Discharge Plan and Services                DME Arranged: Gilford Rile rolling,3-N-1 DME Agency: [employer]       HH Arranged: NA HH Agency: NA        Social Determinants of Health (SDOH) Interventions     Readmission Risk Interventions No flowsheet data found.

## 2021-01-06 DIAGNOSIS — Z471 Aftercare following joint replacement surgery: Secondary | ICD-10-CM | POA: Diagnosis not present

## 2021-01-06 DIAGNOSIS — M6281 Muscle weakness (generalized): Secondary | ICD-10-CM | POA: Diagnosis not present

## 2021-01-06 DIAGNOSIS — M25561 Pain in right knee: Secondary | ICD-10-CM | POA: Diagnosis not present

## 2021-01-06 DIAGNOSIS — R262 Difficulty in walking, not elsewhere classified: Secondary | ICD-10-CM | POA: Diagnosis not present

## 2021-01-10 DIAGNOSIS — R262 Difficulty in walking, not elsewhere classified: Secondary | ICD-10-CM | POA: Diagnosis not present

## 2021-01-10 DIAGNOSIS — Z471 Aftercare following joint replacement surgery: Secondary | ICD-10-CM | POA: Diagnosis not present

## 2021-01-10 DIAGNOSIS — M6281 Muscle weakness (generalized): Secondary | ICD-10-CM | POA: Diagnosis not present

## 2021-01-10 DIAGNOSIS — M25561 Pain in right knee: Secondary | ICD-10-CM | POA: Diagnosis not present

## 2021-01-10 NOTE — Discharge Summary (Signed)
Physician Discharge Summary   Patient ID: Gina Wolfe MRN: [id number] DOB/AGE: October 04, 1955 65 y.o.  Admit date: 01/03/2021 Discharge date: 01/04/2021  Primary Diagnosis: Osteoarthritis, right knee   Admission Diagnoses:  Past Medical History:  Diagnosis Date  . Arthritis   . Asthma   . Chronic pain syndrome   . Diverticulitis   . GERD (gastroesophageal reflux disease)   . IBS (irritable bowel syndrome)   . PONV (postoperative nausea and vomiting)   . Spinal headache    Discharge Diagnoses:   Principal Problem:   OA (osteoarthritis) of knee Active Problems:   Primary osteoarthritis of right knee  Estimated body mass index is 27.25 kg/m as calculated from the following:   Height as of this encounter: 5\' 2"  (1.575 m).   Weight as of this encounter: 67.6 kg.  Procedure:  Procedure(s) (LRB): TOTAL KNEE ARTHROPLASTY (Right)   Consults: None  HPI: Gina Wolfe is a 65 y.o. year old female with end stage OA of her right knee with progressively worsening pain and dysfunction. She has constant pain, with activity and at rest and significant functional deficits with difficulties even with ADLs. She has had extensive non-op management including analgesics, injections of cortisone and viscosupplements, and home exercise program, but remains in significant pain with significant dysfunction.Radiographs show bone on bone arthritis medial and patellofemoral. She presents now for right Total Knee Arthroplasty.  Laboratory Data: Admission on 01/03/2021, Discharged on 01/04/2021  Component Date Value Ref Range Status  . WBC 01/04/2021 11.9* 4.0 - 10.5 K/uL Final  . RBC 01/04/2021 3.92  3.87 - 5.11 MIL/uL Final  . Hemoglobin 01/04/2021 12.1  12.0 - 15.0 g/dL Final  . HCT 01/04/2021 37.6  36.0 - 46.0 % Final  . MCV 01/04/2021 95.9  80.0 - 100.0 fL Final  . MCH 01/04/2021 30.9  26.0 - 34.0 pg Final  . MCHC 01/04/2021 32.2  30.0 - 36.0 g/dL Final  . RDW 01/04/2021 12.9  11.5 - 15.5 % Final   . Platelets 01/04/2021 263  150 - 400 K/uL Final  . nRBC 01/04/2021 0.0  0.0 - 0.2 % Final   Performed at [facility], [place] [address], [place], [place] [address]  . Sodium 01/04/2021 138  135 - 145 mmol/L Final  . Potassium 01/04/2021 4.2  3.5 - 5.1 mmol/L Final  . Chloride 01/04/2021 108  98 - 111 mmol/L Final  . CO2 01/04/2021 23  22 - 32 mmol/L Final  . Glucose, Bld 01/04/2021 113* 70 - 99 mg/dL Final   Glucose reference range applies only to samples taken after fasting for at least 8 hours.  . BUN 01/04/2021 8  8 - 23 mg/dL Final  . Creatinine, Ser 01/04/2021 0.64  0.44 - 1.00 mg/dL Final  . Calcium 01/04/2021 8.4* 8.9 - 10.3 mg/dL Final  . GFR, Estimated 01/04/2021 >60  >60 mL/min Final   Comment: (NOTE) Calculated using the CKD-EPI Creatinine Equation (2021)   . Anion gap 01/04/2021 7  5 - 15 Final   Performed at [facility], [place] [address] [place], [place] [address]  Hospital Outpatient Visit on 12/22/2020  Component Date Value Ref Range Status  . MRSA, PCR 12/22/2020 NEGATIVE  NEGATIVE Final  . Staphylococcus aureus 12/22/2020 NEGATIVE  NEGATIVE Final   Comment: (NOTE) The Xpert SA Assay (FDA approved for NASAL specimens in patients 76 years of age and older), is one component of a comprehensive surveillance program. It is not intended to diagnose infection  nor to guide or monitor treatment. Performed at [facility], [place] [address], [place], [place] [address]   . WBC 12/22/2020 5.8  4.0 - 10.5 K/uL Final  . RBC 12/22/2020 4.29  3.87 - 5.11 MIL/uL Final  . Hemoglobin 12/22/2020 13.0  12.0 - 15.0 g/dL Final  . HCT 12/22/2020 40.8  36.0 - 46.0 % Final  . MCV 12/22/2020 95.1  80.0 - 100.0 fL Final  . MCH 12/22/2020 30.3  26.0 - 34.0 pg Final  . MCHC 12/22/2020 31.9  30.0 - 36.0 g/dL Final  . RDW 12/22/2020 13.0  11.5 - 15.5 % Final  . Platelets 12/22/2020 293  150 - 400 K/uL Final  . nRBC 12/22/2020 0.0  0.0 -  0.2 % Final   Performed at [facility], [place] [address] [place], [place] [address]  . Sodium 12/22/2020 143  135 - 145 mmol/L Final  . Potassium 12/22/2020 4.2  3.5 - 5.1 mmol/L Final  . Chloride 12/22/2020 109  98 - 111 mmol/L Final  . CO2 12/22/2020 26  22 - 32 mmol/L Final  . Glucose, Bld 12/22/2020 117* 70 - 99 mg/dL Final   Glucose reference range applies only to samples taken after fasting for at least 8 hours.  . BUN 12/22/2020 13  8 - 23 mg/dL Final  . Creatinine, Ser 12/22/2020 0.68  0.44 - 1.00 mg/dL Final  . Calcium 12/22/2020 9.3  8.9 - 10.3 mg/dL Final  . Total Protein 12/22/2020 6.3* 6.5 - 8.1 g/dL Final  . Albumin 12/22/2020 3.6  3.5 - 5.0 g/dL Final  . AST 12/22/2020 19  15 - 41 U/L Final  . ALT 12/22/2020 19  0 - 44 U/L Final  . Alkaline Phosphatase 12/22/2020 39  38 - 126 U/L Final  . Total Bilirubin 12/22/2020 0.1* 0.3 - 1.2 mg/dL Final  . GFR, Estimated 12/22/2020 >60  >60 mL/min Final   Comment: (NOTE) Calculated using the CKD-EPI Creatinine Equation (2021)   . Anion gap 12/22/2020 8  5 - 15 Final   Performed at [facility], [place] [address] [place], [place] [address]  . Prothrombin Time 12/22/2020 12.9  11.4 - 15.2 seconds Final  . INR 12/22/2020 1.0  0.8 - 1.2 Final   Comment: (NOTE) INR goal varies based on device and disease states. Performed at [facility], [place] [address], [place], [place] [address]   . aPTT 12/22/2020 30  24 - 36 seconds Final   Performed at [facility], [place] [address] [place], [place] [address]  . ABO/RH(D) 12/22/2020 A POS   Final  . Antibody Screen 12/22/2020 NEG   Final  . Sample Expiration 12/22/2020 01/05/2021,2359   Final  . Extend sample reason 12/22/2020    Final                   Value:NO TRANSFUSIONS OR PREGNANCY IN THE PAST 3 MONTHS Performed at [facility], [place] [address] [place], [place] [address]      X-Rays:No results  found.  EKG: Orders placed or performed in visit on 08/27/20  . EKG     Hospital Course: Gina Wolfe is a 65 y.o. who was admitted to [facility]. They were brought to the operating room on 01/03/2021 and underwent Procedure(s): TOTAL KNEE ARTHROPLASTY.  Patient tolerated the procedure well and was later transferred to the recovery room and then to the orthopaedic floor for postoperative care. They  were given PO and IV analgesics for pain control following their surgery. They were given 24 hours of postoperative antibiotics of  Anti-infectives (From admission, onward)   Start     Dose/Rate Route Frequency Ordered Stop   01/04/21 0800  fluconazole (DIFLUCAN) tablet 150 mg        150 mg Oral  Once 01/04/21 0702 01/04/21 0840   01/03/21 1300  ceFAZolin (ANCEF) IVPB 2g/100 mL premix        2 g 200 mL/hr over 30 Minutes Intravenous Every 6 hours 01/03/21 1048 01/03/21 1948   01/03/21 0600  ceFAZolin (ANCEF) IVPB 2g/100 mL premix        2 g 200 mL/hr over 30 Minutes Intravenous On call to O.R. 01/03/21 [id number] 01/03/21 0719     and started on DVT prophylaxis in the form of Aspirin.   PT and OT were ordered for total joint protocol. Discharge planning consulted to help with postop disposition and equipment needs.  Patient had a good night on the evening of surgery. They started to get up OOB with therapy on POD #0. Pt was seen during rounds and was ready to go home pending progress with therapy. She worked with therapy on POD #1 and was meeting her goals. Pt was discharged to home later that day in stable condition.  Diet: Regular diet Activity: WBAT Follow-up: in 2 weeks Disposition: Home with OPPT Discharged Condition: stable   Discharge Instructions    Call MD / Call 911   Complete by: As directed    If you experience chest pain or shortness of breath, CALL 911 and be transported to the hospital emergency room.  If you develope a fever above 101 F, pus (white drainage) or increased  drainage or redness at the wound, or calf pain, call your surgeon's office.   Change dressing   Complete by: As directed    You may remove the bulky bandage (ACE wrap and gauze) two days after surgery. You will have an adhesive waterproof bandage underneath. Leave this in place until your first follow-up appointment.   Constipation Prevention   Complete by: As directed    Drink plenty of fluids.  Prune juice may be helpful.  You may use a stool softener, such as Colace (over the counter) 100 mg twice a day.  Use MiraLax (over the counter) for constipation as needed.   Diet - low sodium heart healthy   Complete by: As directed    Do not put a pillow under the knee. Place it under the heel.   Complete by: As directed    Driving restrictions   Complete by: As directed    No driving for two weeks   Post-operative opioid taper instructions:   Complete by: As directed    POST-OPERATIVE OPIOID TAPER INSTRUCTIONS: It is important to wean off of your opioid medication as soon as possible. If you do not need pain medication after your surgery it is ok to stop day one. Opioids include: Codeine, Hydrocodone(Norco, Vicodin), Oxycodone(Percocet, oxycontin) and hydromorphone amongst others.  Long term and even short term use of opiods can cause: Increased pain response Dependence Constipation Depression Respiratory depression And more.  Withdrawal symptoms can include Flu like symptoms Nausea, vomiting And more Techniques to manage these symptoms Hydrate well Eat regular healthy meals Stay active Use relaxation techniques(deep breathing, meditating, yoga) Do Not substitute Alcohol to help with tapering If you have been on opioids for less than two weeks and do not have pain  than it is ok to stop all together.  Plan to wean off of opioids This plan should start within one week post op of your joint replacement. Maintain the same interval or time between taking each dose and first decrease the  dose.  Cut the total daily intake of opioids by one tablet each day Next start to increase the time between doses. The last dose that should be eliminated is the evening dose.      TED hose   Complete by: As directed    Use stockings (TED hose) for three weeks on both leg(s).  You may remove them at night for sleeping.   Weight bearing as tolerated   Complete by: As directed      Allergies as of 01/04/2021      Reactions   Clindamycin Hcl Anaphylaxis   Codeine Nausea Only      Medication List    STOP taking these medications   meloxicam 7.5 MG tablet Commonly known as: MOBIC     TAKE these medications   albuterol 108 (90 Base) MCG/ACT inhaler Commonly known as: VENTOLIN HFA Inhale 2 puffs into the lungs every 6 (six) hours as needed for wheezing or shortness of breath.   ALPRAZolam 1 MG tablet Commonly known as: XANAX Take 1 mg by mouth 4 (four) times daily as needed for anxiety.   aspirin 325 MG EC tablet Take 1 tablet (325 mg total) by mouth 2 (two) times daily for 20 days. Then take one 81 mg aspirin once a day for three weeks. Then discontinue aspirin.   cholecalciferol 25 MCG (1000 UNIT) tablet Commonly known as: VITAMIN D3 Take 1,000 Units by mouth daily.   estrogens (conjugated) 0.625 MG tablet Commonly known as: PREMARIN Take 0.625 mg by mouth daily with breakfast.   furosemide 20 MG tablet Commonly known as: LASIX Take 20 mg by mouth daily as needed for edema.   gabapentin 300 MG capsule Commonly known as: NEURONTIN Take a 300 mg capsule three times a day for two weeks following surgery.Then take a 300 mg capsule two times a day for two weeks. Then take a 300 mg capsule once a day for two weeks. Then discontinue.   HYDROmorphone 2 MG tablet Commonly known as: DILAUDID Take 1-2 tablets (2-4 mg total) by mouth every 6 (six) hours as needed for severe pain.   linaclotide 145 MCG Caps capsule Commonly known as: LINZESS Take 145 mcg by mouth daily as  needed (constipation).   methocarbamol 500 MG tablet Commonly known as: ROBAXIN Take 1 tablet (500 mg total) by mouth every 6 (six) hours as needed for muscle spasms.   Molnupiravir 200 MG Caps TAKE 4 CAPSULES BY MOUTH 2 TIMES DAILY FOR 5 DAYS.   promethazine 25 MG tablet Commonly known as: PHENERGAN Take 25 mg by mouth 2 (two) times daily as needed for nausea or vomiting.   traMADol 50 MG tablet Commonly known as: ULTRAM Take 1-2 tablets (50-100 mg total) by mouth every 6 (six) hours as needed for moderate pain.   vitamin B-12 1000 MCG tablet Commonly known as: CYANOCOBALAMIN Take 1,000 mcg by mouth daily.   vitamin C 500 MG tablet Commonly known as: ASCORBIC ACID Take 500 mg by mouth daily.            Discharge Care Instructions  (From admission, onward)         Start     Ordered   01/04/21 0000  Weight bearing as tolerated  01/04/21 0708   01/04/21 0000  Change dressing       Comments: You may remove the bulky bandage (ACE wrap and gauze) two days after surgery. You will have an adhesive waterproof bandage underneath. Leave this in place until your first follow-up appointment.   01/04/21 0708          Follow-up Information    Gaynelle Arabian, MD. Go on 01/18/2021.   Specialty: Orthopedic Surgery Why: You are scheduled for a post-operative appointment on 01-18-21 at 3:15 pm Contact information: [address] [place] [address] [phone]               Signed: Theresa Duty, PA-C Orthopedic Surgery 01/10/2021, 8:10 AM

## 2021-01-12 DIAGNOSIS — M6281 Muscle weakness (generalized): Secondary | ICD-10-CM | POA: Diagnosis not present

## 2021-01-12 DIAGNOSIS — Z471 Aftercare following joint replacement surgery: Secondary | ICD-10-CM | POA: Diagnosis not present

## 2021-01-12 DIAGNOSIS — M25561 Pain in right knee: Secondary | ICD-10-CM | POA: Diagnosis not present

## 2021-01-12 DIAGNOSIS — R262 Difficulty in walking, not elsewhere classified: Secondary | ICD-10-CM | POA: Diagnosis not present

## 2021-01-14 DIAGNOSIS — Z471 Aftercare following joint replacement surgery: Secondary | ICD-10-CM | POA: Diagnosis not present

## 2021-01-14 DIAGNOSIS — M6281 Muscle weakness (generalized): Secondary | ICD-10-CM | POA: Diagnosis not present

## 2021-01-14 DIAGNOSIS — R262 Difficulty in walking, not elsewhere classified: Secondary | ICD-10-CM | POA: Diagnosis not present

## 2021-01-14 DIAGNOSIS — M25561 Pain in right knee: Secondary | ICD-10-CM | POA: Diagnosis not present

## 2021-01-17 DIAGNOSIS — M25561 Pain in right knee: Secondary | ICD-10-CM | POA: Diagnosis not present

## 2021-01-17 DIAGNOSIS — M6281 Muscle weakness (generalized): Secondary | ICD-10-CM | POA: Diagnosis not present

## 2021-01-17 DIAGNOSIS — R262 Difficulty in walking, not elsewhere classified: Secondary | ICD-10-CM | POA: Diagnosis not present

## 2021-01-17 DIAGNOSIS — Z471 Aftercare following joint replacement surgery: Secondary | ICD-10-CM | POA: Diagnosis not present

## 2021-01-21 DIAGNOSIS — M6281 Muscle weakness (generalized): Secondary | ICD-10-CM | POA: Diagnosis not present

## 2021-01-21 DIAGNOSIS — M25561 Pain in right knee: Secondary | ICD-10-CM | POA: Diagnosis not present

## 2021-01-21 DIAGNOSIS — R262 Difficulty in walking, not elsewhere classified: Secondary | ICD-10-CM | POA: Diagnosis not present

## 2021-01-21 DIAGNOSIS — Z471 Aftercare following joint replacement surgery: Secondary | ICD-10-CM | POA: Diagnosis not present

## 2021-01-24 DIAGNOSIS — M1991 Primary osteoarthritis, unspecified site: Secondary | ICD-10-CM | POA: Diagnosis not present

## 2021-01-24 DIAGNOSIS — M6281 Muscle weakness (generalized): Secondary | ICD-10-CM | POA: Diagnosis not present

## 2021-01-24 DIAGNOSIS — Z471 Aftercare following joint replacement surgery: Secondary | ICD-10-CM | POA: Diagnosis not present

## 2021-01-24 DIAGNOSIS — G894 Chronic pain syndrome: Secondary | ICD-10-CM | POA: Diagnosis not present

## 2021-01-24 DIAGNOSIS — R262 Difficulty in walking, not elsewhere classified: Secondary | ICD-10-CM | POA: Diagnosis not present

## 2021-01-24 DIAGNOSIS — K219 Gastro-esophageal reflux disease without esophagitis: Secondary | ICD-10-CM | POA: Diagnosis not present

## 2021-01-24 DIAGNOSIS — M25561 Pain in right knee: Secondary | ICD-10-CM | POA: Diagnosis not present

## 2021-01-31 DIAGNOSIS — M25561 Pain in right knee: Secondary | ICD-10-CM | POA: Diagnosis not present

## 2021-01-31 DIAGNOSIS — Z471 Aftercare following joint replacement surgery: Secondary | ICD-10-CM | POA: Diagnosis not present

## 2021-01-31 DIAGNOSIS — R262 Difficulty in walking, not elsewhere classified: Secondary | ICD-10-CM | POA: Diagnosis not present

## 2021-01-31 DIAGNOSIS — M6281 Muscle weakness (generalized): Secondary | ICD-10-CM | POA: Diagnosis not present

## 2021-02-02 DIAGNOSIS — M25561 Pain in right knee: Secondary | ICD-10-CM | POA: Diagnosis not present

## 2021-02-02 DIAGNOSIS — M6281 Muscle weakness (generalized): Secondary | ICD-10-CM | POA: Diagnosis not present

## 2021-02-02 DIAGNOSIS — Z471 Aftercare following joint replacement surgery: Secondary | ICD-10-CM | POA: Diagnosis not present

## 2021-02-02 DIAGNOSIS — R262 Difficulty in walking, not elsewhere classified: Secondary | ICD-10-CM | POA: Diagnosis not present

## 2021-02-04 DIAGNOSIS — R262 Difficulty in walking, not elsewhere classified: Secondary | ICD-10-CM | POA: Diagnosis not present

## 2021-02-04 DIAGNOSIS — Z471 Aftercare following joint replacement surgery: Secondary | ICD-10-CM | POA: Diagnosis not present

## 2021-02-04 DIAGNOSIS — M6281 Muscle weakness (generalized): Secondary | ICD-10-CM | POA: Diagnosis not present

## 2021-02-04 DIAGNOSIS — M25561 Pain in right knee: Secondary | ICD-10-CM | POA: Diagnosis not present

## 2021-02-08 DIAGNOSIS — Z96651 Presence of right artificial knee joint: Secondary | ICD-10-CM | POA: Diagnosis not present

## 2021-02-08 DIAGNOSIS — Z471 Aftercare following joint replacement surgery: Secondary | ICD-10-CM | POA: Diagnosis not present

## 2021-02-09 DIAGNOSIS — M6281 Muscle weakness (generalized): Secondary | ICD-10-CM | POA: Diagnosis not present

## 2021-02-09 DIAGNOSIS — Z471 Aftercare following joint replacement surgery: Secondary | ICD-10-CM | POA: Diagnosis not present

## 2021-02-09 DIAGNOSIS — R262 Difficulty in walking, not elsewhere classified: Secondary | ICD-10-CM | POA: Diagnosis not present

## 2021-02-09 DIAGNOSIS — M25561 Pain in right knee: Secondary | ICD-10-CM | POA: Diagnosis not present

## 2021-02-11 DIAGNOSIS — M6281 Muscle weakness (generalized): Secondary | ICD-10-CM | POA: Diagnosis not present

## 2021-02-11 DIAGNOSIS — R262 Difficulty in walking, not elsewhere classified: Secondary | ICD-10-CM | POA: Diagnosis not present

## 2021-02-11 DIAGNOSIS — Z471 Aftercare following joint replacement surgery: Secondary | ICD-10-CM | POA: Diagnosis not present

## 2021-02-11 DIAGNOSIS — M25561 Pain in right knee: Secondary | ICD-10-CM | POA: Diagnosis not present

## 2021-02-14 DIAGNOSIS — M6281 Muscle weakness (generalized): Secondary | ICD-10-CM | POA: Diagnosis not present

## 2021-02-14 DIAGNOSIS — M25561 Pain in right knee: Secondary | ICD-10-CM | POA: Diagnosis not present

## 2021-02-14 DIAGNOSIS — R262 Difficulty in walking, not elsewhere classified: Secondary | ICD-10-CM | POA: Diagnosis not present

## 2021-02-14 DIAGNOSIS — Z471 Aftercare following joint replacement surgery: Secondary | ICD-10-CM | POA: Diagnosis not present

## 2021-02-17 DIAGNOSIS — M25561 Pain in right knee: Secondary | ICD-10-CM | POA: Diagnosis not present

## 2021-02-17 DIAGNOSIS — M6281 Muscle weakness (generalized): Secondary | ICD-10-CM | POA: Diagnosis not present

## 2021-02-17 DIAGNOSIS — Z471 Aftercare following joint replacement surgery: Secondary | ICD-10-CM | POA: Diagnosis not present

## 2021-02-17 DIAGNOSIS — R262 Difficulty in walking, not elsewhere classified: Secondary | ICD-10-CM | POA: Diagnosis not present

## 2021-02-22 DIAGNOSIS — M6281 Muscle weakness (generalized): Secondary | ICD-10-CM | POA: Diagnosis not present

## 2021-02-22 DIAGNOSIS — M25561 Pain in right knee: Secondary | ICD-10-CM | POA: Diagnosis not present

## 2021-02-22 DIAGNOSIS — R262 Difficulty in walking, not elsewhere classified: Secondary | ICD-10-CM | POA: Diagnosis not present

## 2021-02-22 DIAGNOSIS — Z471 Aftercare following joint replacement surgery: Secondary | ICD-10-CM | POA: Diagnosis not present

## 2021-02-24 DIAGNOSIS — R262 Difficulty in walking, not elsewhere classified: Secondary | ICD-10-CM | POA: Diagnosis not present

## 2021-02-24 DIAGNOSIS — M25561 Pain in right knee: Secondary | ICD-10-CM | POA: Diagnosis not present

## 2021-02-24 DIAGNOSIS — M6281 Muscle weakness (generalized): Secondary | ICD-10-CM | POA: Diagnosis not present

## 2021-02-24 DIAGNOSIS — Z471 Aftercare following joint replacement surgery: Secondary | ICD-10-CM | POA: Diagnosis not present

## 2021-02-28 DIAGNOSIS — M25561 Pain in right knee: Secondary | ICD-10-CM | POA: Diagnosis not present

## 2021-02-28 DIAGNOSIS — Z471 Aftercare following joint replacement surgery: Secondary | ICD-10-CM | POA: Diagnosis not present

## 2021-02-28 DIAGNOSIS — R262 Difficulty in walking, not elsewhere classified: Secondary | ICD-10-CM | POA: Diagnosis not present

## 2021-02-28 DIAGNOSIS — M6281 Muscle weakness (generalized): Secondary | ICD-10-CM | POA: Diagnosis not present

## 2021-03-02 DIAGNOSIS — M6281 Muscle weakness (generalized): Secondary | ICD-10-CM | POA: Diagnosis not present

## 2021-03-02 DIAGNOSIS — M25561 Pain in right knee: Secondary | ICD-10-CM | POA: Diagnosis not present

## 2021-03-02 DIAGNOSIS — Z471 Aftercare following joint replacement surgery: Secondary | ICD-10-CM | POA: Diagnosis not present

## 2021-03-02 DIAGNOSIS — R262 Difficulty in walking, not elsewhere classified: Secondary | ICD-10-CM | POA: Diagnosis not present

## 2021-03-07 DIAGNOSIS — R262 Difficulty in walking, not elsewhere classified: Secondary | ICD-10-CM | POA: Diagnosis not present

## 2021-03-07 DIAGNOSIS — M25561 Pain in right knee: Secondary | ICD-10-CM | POA: Diagnosis not present

## 2021-03-07 DIAGNOSIS — M6281 Muscle weakness (generalized): Secondary | ICD-10-CM | POA: Diagnosis not present

## 2021-03-07 DIAGNOSIS — Z471 Aftercare following joint replacement surgery: Secondary | ICD-10-CM | POA: Diagnosis not present

## 2021-03-09 DIAGNOSIS — M6281 Muscle weakness (generalized): Secondary | ICD-10-CM | POA: Diagnosis not present

## 2021-03-09 DIAGNOSIS — R262 Difficulty in walking, not elsewhere classified: Secondary | ICD-10-CM | POA: Diagnosis not present

## 2021-03-09 DIAGNOSIS — Z471 Aftercare following joint replacement surgery: Secondary | ICD-10-CM | POA: Diagnosis not present

## 2021-03-09 DIAGNOSIS — M25561 Pain in right knee: Secondary | ICD-10-CM | POA: Diagnosis not present

## 2021-03-10 DIAGNOSIS — G894 Chronic pain syndrome: Secondary | ICD-10-CM | POA: Diagnosis not present

## 2021-03-10 DIAGNOSIS — K219 Gastro-esophageal reflux disease without esophagitis: Secondary | ICD-10-CM | POA: Diagnosis not present

## 2021-03-15 DIAGNOSIS — M25561 Pain in right knee: Secondary | ICD-10-CM | POA: Diagnosis not present

## 2021-03-15 DIAGNOSIS — M6281 Muscle weakness (generalized): Secondary | ICD-10-CM | POA: Diagnosis not present

## 2021-03-15 DIAGNOSIS — Z471 Aftercare following joint replacement surgery: Secondary | ICD-10-CM | POA: Diagnosis not present

## 2021-03-15 DIAGNOSIS — R262 Difficulty in walking, not elsewhere classified: Secondary | ICD-10-CM | POA: Diagnosis not present

## 2021-04-01 DIAGNOSIS — G894 Chronic pain syndrome: Secondary | ICD-10-CM | POA: Diagnosis not present

## 2021-04-01 DIAGNOSIS — K5792 Diverticulitis of intestine, part unspecified, without perforation or abscess without bleeding: Secondary | ICD-10-CM | POA: Diagnosis not present

## 2021-05-02 ENCOUNTER — Other Ambulatory Visit (HOSPITAL_COMMUNITY): Payer: Self-pay | Admitting: Internal Medicine

## 2021-05-02 DIAGNOSIS — Z6825 Body mass index (BMI) 25.0-25.9, adult: Secondary | ICD-10-CM | POA: Diagnosis not present

## 2021-05-02 DIAGNOSIS — R0600 Dyspnea, unspecified: Secondary | ICD-10-CM

## 2021-05-02 DIAGNOSIS — M609 Myositis, unspecified: Secondary | ICD-10-CM | POA: Diagnosis not present

## 2021-05-02 DIAGNOSIS — R0609 Other forms of dyspnea: Secondary | ICD-10-CM | POA: Diagnosis not present

## 2021-05-02 DIAGNOSIS — R079 Chest pain, unspecified: Secondary | ICD-10-CM | POA: Diagnosis not present

## 2021-05-02 DIAGNOSIS — E663 Overweight: Secondary | ICD-10-CM | POA: Diagnosis not present

## 2021-05-02 DIAGNOSIS — G894 Chronic pain syndrome: Secondary | ICD-10-CM | POA: Diagnosis not present

## 2021-05-02 DIAGNOSIS — M1991 Primary osteoarthritis, unspecified site: Secondary | ICD-10-CM | POA: Diagnosis not present

## 2021-05-04 ENCOUNTER — Other Ambulatory Visit (HOSPITAL_COMMUNITY): Payer: Self-pay | Admitting: Internal Medicine

## 2021-05-04 DIAGNOSIS — Z1231 Encounter for screening mammogram for malignant neoplasm of breast: Secondary | ICD-10-CM

## 2021-05-06 ENCOUNTER — Ambulatory Visit (HOSPITAL_COMMUNITY)
Admission: RE | Admit: 2021-05-06 | Discharge: 2021-05-06 | Disposition: A | Discharge status: Home / Self Care | Payer: Medicare HMO | Source: Ambulatory Visit | Attending: Internal Medicine | Admitting: Internal Medicine

## 2021-05-06 ENCOUNTER — Other Ambulatory Visit: Payer: Self-pay

## 2021-05-06 DIAGNOSIS — Z1231 Encounter for screening mammogram for malignant neoplasm of breast: Secondary | ICD-10-CM | POA: Diagnosis not present

## 2021-05-09 ENCOUNTER — Other Ambulatory Visit (HOSPITAL_COMMUNITY): Payer: Self-pay | Admitting: Internal Medicine

## 2021-05-09 ENCOUNTER — Other Ambulatory Visit: Payer: Self-pay | Admitting: Internal Medicine

## 2021-05-09 DIAGNOSIS — R791 Abnormal coagulation profile: Secondary | ICD-10-CM

## 2021-05-09 DIAGNOSIS — R0600 Dyspnea, unspecified: Secondary | ICD-10-CM

## 2021-05-10 ENCOUNTER — Encounter (HOSPITAL_COMMUNITY): Payer: Self-pay

## 2021-05-10 ENCOUNTER — Emergency Department (HOSPITAL_COMMUNITY): Payer: Medicare HMO

## 2021-05-10 ENCOUNTER — Other Ambulatory Visit: Payer: Self-pay

## 2021-05-10 ENCOUNTER — Emergency Department (HOSPITAL_COMMUNITY)
Admission: EM | Admit: 2021-05-10 | Discharge: 2021-05-10 | Disposition: A | Discharge status: Home / Self Care | Payer: Medicare HMO | Attending: Emergency Medicine | Admitting: Emergency Medicine

## 2021-05-10 DIAGNOSIS — R7989 Other specified abnormal findings of blood chemistry: Secondary | ICD-10-CM | POA: Diagnosis not present

## 2021-05-10 DIAGNOSIS — R0600 Dyspnea, unspecified: Secondary | ICD-10-CM | POA: Diagnosis not present

## 2021-05-10 DIAGNOSIS — J45909 Unspecified asthma, uncomplicated: Secondary | ICD-10-CM | POA: Diagnosis not present

## 2021-05-10 DIAGNOSIS — R0602 Shortness of breath: Secondary | ICD-10-CM | POA: Diagnosis not present

## 2021-05-10 DIAGNOSIS — Z96651 Presence of right artificial knee joint: Secondary | ICD-10-CM | POA: Diagnosis not present

## 2021-05-10 DIAGNOSIS — Z87891 Personal history of nicotine dependence: Secondary | ICD-10-CM | POA: Insufficient documentation

## 2021-05-10 DIAGNOSIS — Z7982 Long term (current) use of aspirin: Secondary | ICD-10-CM | POA: Insufficient documentation

## 2021-05-10 DIAGNOSIS — Z7952 Long term (current) use of systemic steroids: Secondary | ICD-10-CM | POA: Insufficient documentation

## 2021-05-10 DIAGNOSIS — R079 Chest pain, unspecified: Secondary | ICD-10-CM | POA: Diagnosis not present

## 2021-05-10 DIAGNOSIS — R0789 Other chest pain: Secondary | ICD-10-CM | POA: Diagnosis not present

## 2021-05-10 LAB — CBC WITH DIFFERENTIAL/PLATELET
Abs Immature Granulocytes: 0.01 10*3/uL (ref 0.00–0.07)
Basophils Absolute: 0 10*3/uL (ref 0.0–0.1)
Basophils Relative: 1 %
Eosinophils Absolute: 0.2 10*3/uL (ref 0.0–0.5)
Eosinophils Relative: 3 %
HCT: 40.7 % (ref 36.0–46.0)
Hemoglobin: 13.3 g/dL (ref 12.0–15.0)
Immature Granulocytes: 0 %
Lymphocytes Relative: 43 %
Lymphs Abs: 2.2 10*3/uL (ref 0.7–4.0)
MCH: 29.7 pg (ref 26.0–34.0)
MCHC: 32.7 g/dL (ref 30.0–36.0)
MCV: 90.8 fL (ref 80.0–100.0)
Monocytes Absolute: 0.4 10*3/uL (ref 0.1–1.0)
Monocytes Relative: 8 %
Neutro Abs: 2.3 10*3/uL (ref 1.7–7.7)
Neutrophils Relative %: 45 %
Platelets: 306 10*3/uL (ref 150–400)
RBC: 4.48 MIL/uL (ref 3.87–5.11)
RDW: 13.4 % (ref 11.5–15.5)
WBC: 5.1 10*3/uL (ref 4.0–10.5)
nRBC: 0 % (ref 0.0–0.2)

## 2021-05-10 LAB — TROPONIN I (HIGH SENSITIVITY)
Troponin I (High Sensitivity): 3 ng/L (ref <18)
Troponin I (High Sensitivity): 2 ng/L (ref <18)

## 2021-05-10 LAB — COMPREHENSIVE METABOLIC PANEL
ALT: 15 U/L (ref 0–44)
AST: 15 U/L (ref 15–41)
Albumin: 3.7 g/dL (ref 3.5–5.0)
Alkaline Phosphatase: 54 U/L (ref 38–126)
Anion gap: 7 (ref 5–15)
BUN: 12 mg/dL (ref 8–23)
CO2: 25 mmol/L (ref 22–32)
Calcium: 8.5 mg/dL — ABNORMAL LOW (ref 8.9–10.3)
Chloride: 104 mmol/L (ref 98–111)
Creatinine, Ser: 0.71 mg/dL (ref 0.44–1.00)
GFR, Estimated: >60 mL/min (ref >60)
Glucose, Bld: 80 mg/dL (ref 70–99)
Potassium: 3.7 mmol/L (ref 3.5–5.1)
Sodium: 136 mmol/L (ref 135–145)
Total Bilirubin: 0.4 mg/dL (ref 0.3–1.2)
Total Protein: 6.6 g/dL (ref 6.5–8.1)

## 2021-05-10 LAB — D-DIMER, QUANTITATIVE: D-Dimer, Quant: 1.67 ug/mL-FEU — ABNORMAL HIGH (ref 0.00–0.50)

## 2021-05-10 LAB — MAGNESIUM: Magnesium: 1.9 mg/dL (ref 1.7–2.4)

## 2021-05-10 LAB — PROTIME-INR
INR: 1 (ref 0.8–1.2)
Prothrombin Time: 12.8 seconds (ref 11.4–15.2)

## 2021-05-10 LAB — BRAIN NATRIURETIC PEPTIDE: B Natriuretic Peptide: 58 pg/mL (ref 0.0–100.0)

## 2021-05-10 MED ORDER — IOHEXOL 350 MG/ML SOLN
100.0000 mL | Freq: Once | INTRAVENOUS | Status: AC | PRN
  Administered 2021-05-10: 75 mL via INTRAVENOUS

## 2021-05-10 NOTE — ED Triage Notes (Signed)
Pt to er, pt states that she is here for chest pain, sob and some strange feeling in her arm. States that she has had some chest pain for the past three weeks, states that she talked with her pmd and was set to have a ct scan at [place].  States that last night her pain started getting worse and her pmd told her that if she started feeling worse she should go to the er.

## 2021-05-10 NOTE — ED Provider Notes (Signed)
[facility] EMERGENCY DEPARTMENT Provider Note   CSN: [id number] Arrival date & time: 05/10/21  [id number]     History Chief Complaint  Patient presents with   Chest Pain    [name] is a 65 y.o. female.  HPI Patient presents with concern of intermittent chest pain.  Onset was about 3 weeks ago, no obvious precipitant.  Since that time she has had intermittent periods of chest tightness, with pain that is currently 4/10, sternal, pressure-like.  There is associated dyspnea intermittently, not consistently, without obvious precipitating, alleviating or exacerbating factors. She is seen her physician, was scheduled for CT scan to occur next week, but today, with an acute exacerbation she presents for evaluation. She notes no prior similar episodes, states that she is generally well aside from a history of asthma.    Past Medical History:  Diagnosis Date   Arthritis    Asthma    Chronic pain syndrome    Diverticulitis    GERD (gastroesophageal reflux disease)    IBS (irritable bowel syndrome)    PONV (postoperative nausea and vomiting)    Spinal headache     Patient Active Problem List   Diagnosis Date Noted   OA (osteoarthritis) of knee 01/03/2021   Primary osteoarthritis of right knee 01/03/2021   Lumbar herniated disc 07/13/2016   Early satiety 02/02/2015   Shortness of breath 08/24/2014   GERD 05/06/2010   Constipation 05/06/2010    Past Surgical History:  Procedure Laterality Date   ABDOMINAL HYSTERECTOMY     BACK SURGERY     x3 previous lumbar, x3 cervical surgeries   CHOLECYSTECTOMY     COLONOSCOPY  05/20/10   [id number] hemorrhoids otherwise normal/EGD:distal esophageal erosions otherwise normal   CYST EXCISION Right    Eye cyst   EYE SURGERY Right    removed a cyst   LUMBAR LAMINECTOMY/DECOMPRESSION MICRODISCECTOMY N/A 07/13/2016   Procedure: DISCECTOMY Lumbar four-five;  Surgeon: Newman Pies, MD;  Location: [place];  Service: Neurosurgery;  Laterality:  N/A;   MENISCUS REPAIR  2007   R knee    POSTERIOR CERVICAL FUSION/FORAMINOTOMY N/A 04/10/2013   Procedure: POSTERIOR CERVICAL FUSION/FORAMINOTOMY LEVEL 2;  Surgeon: Sinclair Ship, MD;  Location: [place];  Service: Orthopedics;  Laterality: N/A;  Posterior cervical decompression fusion, cervical 5-6, cervical 6-7 with instrumentation and allograft.   TOTAL KNEE ARTHROPLASTY Right 01/03/2021   Procedure: TOTAL KNEE ARTHROPLASTY;  Surgeon: Gaynelle Arabian, MD;  Location: [facility];  Service: Orthopedics;  Laterality: Right;  56mn   TUBAL LIGATION       OB History   No obstetric history on file.     Family History  Problem Relation Age of Onset   Hypertension Mother    Emphysema Mother    Cancer Father    Hypertension Other    Diabetes Other    Colon cancer Neg Hx    Colon polyps Neg Hx     Social History   Tobacco Use   Smoking status: Former    Types: Cigarettes    Quit date: 08/25/2007    Years since quitting: 13.7   Smokeless tobacco: Never  Vaping Use   Vaping Use: Never used  Substance Use Topics   Alcohol use: No    Alcohol/week: 0.0 standard drinks   Drug use: No    Home Medications Prior to Admission medications   Medication Sig Start Date End Date Taking? Authorizing Provider  albuterol (PROVENTIL HFA;VENTOLIN HFA) 108 (90 Base) MCG/ACT inhaler Inhale 2  puffs into the lungs every 6 (six) hours as needed for wheezing or shortness of breath.    Yes [provider]  aspirin EC 81 MG tablet Take 81 mg by mouth daily. Swallow whole.   Yes [provider]  cholecalciferol (VITAMIN D3) 25 MCG (1000 UNIT) tablet Take 1,000 Units by mouth daily.   Yes [provider]  estrogens, conjugated, (PREMARIN) 0.625 MG tablet Take 0.625 mg by mouth daily with breakfast.    Yes [provider]  furosemide (LASIX) 20 MG tablet Take 20 mg by mouth daily as needed for edema.    Yes [provider]  linaclotide (LINZESS) 145 MCG CAPS  capsule Take 145 mcg by mouth daily as needed (constipation).   Yes [provider]  oxyCODONE-acetaminophen (PERCOCET) 10-325 MG tablet Take 1 tablet by mouth every 4 (four) hours as needed. 05/02/21  Yes [provider]  pantoprazole (PROTONIX) 40 MG tablet Take 1 tablet by mouth daily.   Yes [provider]  promethazine (PHENERGAN) 25 MG tablet Take 25 mg by mouth 2 (two) times daily as needed for nausea or vomiting.   Yes [provider]  traMADol (ULTRAM) 50 MG tablet Take 1-2 tablets (50-100 mg total) by mouth every 6 (six) hours as needed for moderate pain. 01/04/21  Yes Edmisten, Kristie L, PA  vitamin B-12 (CYANOCOBALAMIN) 1000 MCG tablet Take 1,000 mcg by mouth daily.   Yes [provider]  vitamin C (ASCORBIC ACID) 500 MG tablet Take 500 mg by mouth daily.   Yes [provider]  ALPRAZolam Duanne Moron) 1 MG tablet Take 1 mg by mouth 4 (four) times daily as needed for anxiety.  Patient not taking: Reported on 05/10/2021    [provider]  gabapentin (NEURONTIN) 300 MG capsule Take a 300 mg capsule three times a day for two weeks following surgery.Then take a 300 mg capsule two times a day for two weeks. Then take a 300 mg capsule once a day for two weeks. Then discontinue. Patient not taking: No sig reported 01/04/21   Edmisten, Kristie L, PA  HYDROmorphone (DILAUDID) 2 MG tablet Take 1-2 tablets (2-4 mg total) by mouth every 6 (six) hours as needed for severe pain. 01/04/21   Edmisten, Ok Anis, PA  methocarbamol (ROBAXIN) 500 MG tablet Take 1 tablet (500 mg total) by mouth every 6 (six) hours as needed for muscle spasms. Patient not taking: No sig reported 01/04/21   Edmisten, Kristie L, PA  Molnupiravir 200 MG CAPS TAKE 4 CAPSULES BY MOUTH 2 TIMES DAILY FOR 5 DAYS. Patient not taking: No sig reported 10/10/20 10/10/21  [name], PA    Allergies    Clindamycin hcl and Codeine  Review of Systems   Review of Systems   Constitutional:        Per HPI, otherwise negative  HENT:         Per HPI, otherwise negative  Respiratory:         Per HPI, otherwise negative  Cardiovascular:        Per HPI, otherwise negative  Gastrointestinal:  Negative for vomiting.  Endocrine:       Negative aside from HPI  Genitourinary:        Neg aside from HPI   Musculoskeletal:        Per HPI, otherwise negative  Skin: Negative.   Neurological:  Negative for syncope.   Physical Exam Updated Vital Signs BP 134/78   Pulse (!) 59  Temp 98 F (36.7 C) (Oral)   Resp 10   Ht '5\' 2"'$  (1.575 m)   Wt 63.5 kg   SpO2 99%   BMI 25.61 kg/m   Physical Exam Vitals and nursing note reviewed.  Constitutional:      General: She is not in acute distress.    Appearance: She is well-developed.  HENT:     Head: Normocephalic and atraumatic.  Eyes:     Conjunctiva/sclera: Conjunctivae normal.  Cardiovascular:     Rate and Rhythm: Normal rate and regular rhythm.  Pulmonary:     Effort: Pulmonary effort is normal. No respiratory distress.     Breath sounds: Normal breath sounds. No stridor.  Abdominal:     General: There is no distension.  Skin:    General: Skin is warm and dry.  Neurological:     Mental Status: She is alert and oriented to person, place, and time.     Cranial Nerves: No cranial nerve deficit.    ED Results / Procedures / Treatments   Labs (all labs ordered are listed, but only abnormal results are displayed) Labs Reviewed  COMPREHENSIVE METABOLIC PANEL - Abnormal; Notable for the following components:      Result Value   Calcium 8.5 (*)    All other components within normal limits  D-DIMER, QUANTITATIVE - Abnormal; Notable for the following components:   D-Dimer, Quant 1.67 (*)    All other components within normal limits  MAGNESIUM  BRAIN NATRIURETIC PEPTIDE  CBC WITH DIFFERENTIAL/PLATELET  PROTIME-INR  CBG MONITORING, ED  TROPONIN I (HIGH SENSITIVITY)  TROPONIN I (HIGH SENSITIVITY)     EKG EKG Interpretation  Date/Time:  Tuesday May 10 2021 08:53:02 EDT Ventricular Rate:  60 PR Interval:  154 QRS Duration: 88 QT Interval:  398 QTC Calculation: 398 R Axis:   45 Text Interpretation: Sinus rhythm Low voltage, precordial leads unremarkable ECG Confirmed by Carmin Muskrat [phone]) on 05/10/2021 8:58:45 AM  Radiology CT Angio Chest PE W/Cm &/Or Wo Cm  Result Date: 05/10/2021 CLINICAL DATA:  Positive D-dimer. Concern for pulmonary embolism. Short of breath and chest pain for 3 weeks. EXAM: CT ANGIOGRAPHY CHEST WITH CONTRAST TECHNIQUE: Multidetector CT imaging of the chest was performed using the standard protocol during bolus administration of intravenous contrast. Multiplanar CT image reconstructions and MIPs were obtained to evaluate the vascular anatomy. CONTRAST:  70m OMNIPAQUE IOHEXOL 350 MG/ML SOLN COMPARISON:  None. FINDINGS: Cardiovascular: No significant vascular findings. Normal heart size. No filling defects within the pulmonary arteries to suggest acute pulmonary embolism. Mediastinum/Nodes: No axillary or supraclavicular adenopathy. No mediastinal or hilar adenopathy. No pericardial fluid. Esophagus normal. Lungs/Pleura: No pulmonary infarction. No pneumonia. Airways normal. No pneumothorax. Upper Abdomen: Limited view of the liver, kidneys, pancreas are unremarkable. Normal adrenal glands. Musculoskeletal: No aggressive osseous lesion. Review of the MIP images confirms the above findings. IMPRESSION: 1. No acute pulmonary embolism. 2. No acute pulmonary parenchymal findings Electronically Signed   By: SSuzy BouchardM.D.   On: 05/10/2021 11:09   DG Chest Portable 1 View  Result Date: 05/10/2021 CLINICAL DATA:  Chest pain EXAM: PORTABLE CHEST 1 VIEW COMPARISON:  None. FINDINGS: The heart size and mediastinal contours are within normal limits. Both lungs are clear. The visualized skeletal structures are unremarkable. IMPRESSION: No active disease. Electronically  Signed   By: LYetta GlassmanM.D.   On: 05/10/2021 09:39    Procedures Procedures   Medications Ordered in ED Medications  iohexol (OMNIPAQUE) 350 MG/ML injection 100  mL (75 mLs Intravenous Contrast Given 05/10/21 1043)    ED Course  I have reviewed the triage vital signs and the nursing notes.  Pertinent labs & imaging results that were available during my care of the patient were reviewed by me and considered in my medical decision making (see chart for details).  Cardiac 60s sinus normal Pulse ox 100% room air normal 1:13 PM Patient in no distress, awake, alert, hemodynamically unremarkable/unchanged from arrival, no oxygen requirement, no increased work of breathing. I reviewed her CT scan, discussed with her, labs as well.  2 normal troponin, nonischemic EKG all reassuring low suspicion for atypical ACS. D-dimer was positive, but CT angiography negative for pulmonary embolism, pneumothorax, pneumonia. No obvious evidence for dissection and the patient is not remarkably hypertensive, has no pulse deficits. Elevated D-dimer may be secondary to recent infection, possibly COVID, but findings are otherwise reassuring, and after lengthy conversation with the patient about these, patient is appropriate for discharge with close outpatient follow-up with her physician.  Final Clinical Impression(s) / ED Diagnoses Final diagnoses:  Atypical chest pain     Carmin Muskrat, MD 05/10/21 1314

## 2021-05-10 NOTE — Discharge Instructions (Addendum)
As discussed, your evaluation today has been largely reassuring.  But, it is important that you monitor your condition carefully, and do not hesitate to return to the ED if you develop new, or concerning changes in your condition. ? ?Otherwise, please follow-up with your physician for appropriate ongoing care. ? ?

## 2021-05-17 ENCOUNTER — Ambulatory Visit (HOSPITAL_BASED_OUTPATIENT_CLINIC_OR_DEPARTMENT_OTHER): Payer: Medicare HMO

## 2021-05-17 ENCOUNTER — Encounter (HOSPITAL_BASED_OUTPATIENT_CLINIC_OR_DEPARTMENT_OTHER): Payer: Self-pay

## 2021-05-31 ENCOUNTER — Ambulatory Visit (INDEPENDENT_AMBULATORY_CARE_PROVIDER_SITE_OTHER): Payer: Self-pay | Admitting: *Deleted

## 2021-05-31 VITALS — Ht 62.0 in | Wt 138.0 lb

## 2021-05-31 DIAGNOSIS — Z1211 Encounter for screening for malignant neoplasm of colon: Secondary | ICD-10-CM

## 2021-05-31 NOTE — Progress Notes (Signed)
Pt was a nurse phone visit.  She was advised due to meds and problems with hemorrhoids that she needed ov to arrange colonoscopy.  Scheduled ov for 07/21/2021 at 10:30 with Roseanne Kaufman, NP.  Routing as FYI to [employer], PA-C.

## 2021-05-31 NOTE — Progress Notes (Signed)
Gastroenterology Pre-Procedure Review  Request Date: 05/31/2021 Requesting Physician: 10 year recall, Last TCS 05/20/2010 done by Dr. Gala Romney, internal hemorrhoids  PATIENT REVIEW QUESTIONS: The patient responded to the following health history questions as indicated:    1. Diabetes Melitis: no 2. Joint replacements in the past 12 months: yes, total right knee replacement Apr 2022 3. Major health problems in the past 3 months: no 4. Has an artificial valve or MVP: no 5. Has a defibrillator: no 6. Has been advised in past to take antibiotics in advance of a procedure like teeth cleaning: yes, before knee replacement 7. Family history of colon cancer: no  8. Alcohol Use: no 9. Illicit drug Use: no 10. History of sleep apnea: no 11. History of coronary artery or other vascular stents placed within the last 12 months: no 12. History of any prior anesthesia complications: yes, nausea 13. Body mass index is 25.24 kg/m.    MEDICATIONS & ALLERGIES:    Patient reports the following regarding taking any blood thinners:   Plavix? no Aspirin? yes, 81 mg Coumadin? no Brilinta? no Xarelto? no Eliquis? no Pradaxa? no Savaysa? no Effient? no  Patient confirms/reports the following medications:  Current Outpatient Medications  Medication Sig Dispense Refill   albuterol (PROVENTIL HFA;VENTOLIN HFA) 108 (90 Base) MCG/ACT inhaler Inhale 2 puffs into the lungs every 6 (six) hours as needed for wheezing or shortness of breath.      ALPRAZolam (XANAX) 1 MG tablet Take 1 mg by mouth as needed for anxiety.     aspirin EC 81 MG tablet Take 81 mg by mouth daily. Swallow whole.     cholecalciferol (VITAMIN D3) 25 MCG (1000 UNIT) tablet Take 1,000 Units by mouth daily.     estrogens, conjugated, (PREMARIN) 0.625 MG tablet Take 0.625 mg by mouth daily with breakfast.      furosemide (LASIX) 20 MG tablet Take 20 mg by mouth daily as needed for edema.      linaclotide (LINZESS) 145 MCG CAPS capsule Take 290  mcg by mouth daily. Takes Linzess 290 mcg daily.     oxyCODONE-acetaminophen (PERCOCET) 10-325 MG tablet Take 1 tablet by mouth every 4 (four) hours as needed.     pantoprazole (PROTONIX) 40 MG tablet Take 1 tablet by mouth daily.     promethazine (PHENERGAN) 25 MG tablet Take 25 mg by mouth as needed for nausea or vomiting.     traMADol (ULTRAM) 50 MG tablet Take 1-2 tablets (50-100 mg total) by mouth every 6 (six) hours as needed for moderate pain. (Patient taking differently: Take 50-100 mg by mouth as needed for moderate pain.) 40 tablet 0   vitamin B-12 (CYANOCOBALAMIN) 1000 MCG tablet Take 1,000 mcg by mouth daily.     vitamin C (ASCORBIC ACID) 500 MG tablet Take 500 mg by mouth daily.     No current facility-administered medications for this visit.    Patient confirms/reports the following allergies:  Allergies  Allergen Reactions   Clindamycin Hcl Anaphylaxis   Codeine Nausea Only    No orders of the defined types were placed in this encounter.   AUTHORIZATION INFORMATION Primary Insurance: [facility],  [place] #: [id number],  Group #: [id number] Pre-Cert / Josem Kaufmann required:  Pre-Cert / Auth #:   SCHEDULE INFORMATION: Procedure has been scheduled as follows:  Date: , Time:   Location:   This Gastroenterology Pre-Precedure Review Form is being routed to the following provider(s): Neil Crouch, PA-C

## 2021-05-31 NOTE — Progress Notes (Signed)
Noted  

## 2021-06-01 DIAGNOSIS — G894 Chronic pain syndrome: Secondary | ICD-10-CM | POA: Diagnosis not present

## 2021-06-01 DIAGNOSIS — F1120 Opioid dependence, uncomplicated: Secondary | ICD-10-CM | POA: Diagnosis not present

## 2021-06-01 DIAGNOSIS — L301 Dyshidrosis [pompholyx]: Secondary | ICD-10-CM | POA: Diagnosis not present

## 2021-06-07 ENCOUNTER — Ambulatory Visit: Payer: Medicare HMO

## 2021-06-15 ENCOUNTER — Emergency Department (HOSPITAL_COMMUNITY): Payer: Medicare HMO

## 2021-06-15 ENCOUNTER — Emergency Department (HOSPITAL_COMMUNITY)
Admission: EM | Admit: 2021-06-15 | Discharge: 2021-06-15 | Disposition: A | Discharge status: Home / Self Care | Payer: Medicare HMO | Attending: Emergency Medicine | Admitting: Emergency Medicine

## 2021-06-15 ENCOUNTER — Encounter (HOSPITAL_COMMUNITY): Payer: Self-pay

## 2021-06-15 ENCOUNTER — Other Ambulatory Visit: Payer: Self-pay

## 2021-06-15 DIAGNOSIS — J45909 Unspecified asthma, uncomplicated: Secondary | ICD-10-CM | POA: Insufficient documentation

## 2021-06-15 DIAGNOSIS — M542 Cervicalgia: Secondary | ICD-10-CM | POA: Insufficient documentation

## 2021-06-15 DIAGNOSIS — Z7982 Long term (current) use of aspirin: Secondary | ICD-10-CM | POA: Insufficient documentation

## 2021-06-15 DIAGNOSIS — Z87891 Personal history of nicotine dependence: Secondary | ICD-10-CM | POA: Diagnosis not present

## 2021-06-15 DIAGNOSIS — Z96651 Presence of right artificial knee joint: Secondary | ICD-10-CM | POA: Diagnosis not present

## 2021-06-15 MED ORDER — CYCLOBENZAPRINE HCL 10 MG PO TABS
5.0000 mg | ORAL_TABLET | Freq: Once | ORAL | Status: AC
  Administered 2021-06-15: 5 mg via ORAL
  Filled 2021-06-15: qty 1

## 2021-06-15 MED ORDER — CYCLOBENZAPRINE HCL 10 MG PO TABS: 10.0000 mg | ORAL_TABLET | Freq: Two times a day (BID) | ORAL | 0 refills | Status: DC | PRN

## 2021-06-15 NOTE — ED Notes (Signed)
Pt given ice pack for pain.

## 2021-06-15 NOTE — Discharge Instructions (Addendum)
You have been seen here for neck pain. I recommend taking over-the-counter pain medications like ibuprofen and/or Tylenol every 6 as needed.  Please follow dosage and on the back of bottle.  I also recommend applying heat to the area and stretching out the muscles as this will help decrease stiffness and pain.  I have given you information on exercises please follow.  I have also given you a prescription for a muscle relaxer this can make you drowsy do not consume alcohol or operate heavy machinery when taking this medication.   Please follow-up with neurosurgery for further evaluation.  Come back to the emergency department if you develop chest pain, shortness of breath, severe abdominal pain, uncontrolled nausea, vomiting, diarrhea.

## 2021-06-15 NOTE — ED Triage Notes (Signed)
States neck pain since yesterday when she woke up, gradually got worse and states she cant move in either direction. Previous neck surgery x 3, last one in 2011 by Dr Arnoldo Morale. Took tramadol ~7am this AM

## 2021-06-15 NOTE — ED Provider Notes (Signed)
[facility] EMERGENCY DEPARTMENT Provider Note   CSN: [id number] Arrival date & time: 06/15/21  1207     History Chief Complaint  Patient presents with   Torticollis    Gina Wolfe is a 65 y.o. female.  HPI  Patient with significant medical history of arthritis, chronic pain syndrome, back and neck surgery presents with chief complaint of neck pain.  Patient states neck pain started yesterday morning, started after she woke up out of bed, the pain is worsened with movements of her neck or of her body.  The pain does not radiate, states remaining in her neck, describes as a sharp-like sensation.  She has no associated headaches, change in vision, paresthesias or weakness in the upper or lower extremities, denies any trauma to the area, no history of IV drug use, denies associated fevers, chills, chest pain, pedal edema.  States that she has taken some over-the-counter pain medication without not much relief.  She has no other complaints at this time.  Past Medical History:  Diagnosis Date   Arthritis    Asthma    Chronic pain syndrome    Diverticulitis    GERD (gastroesophageal reflux disease)    IBS (irritable bowel syndrome)    PONV (postoperative nausea and vomiting)    Spinal headache     Patient Active Problem List   Diagnosis Date Noted   OA (osteoarthritis) of knee 01/03/2021   Primary osteoarthritis of right knee 01/03/2021   Lumbar herniated disc 07/13/2016   Early satiety 02/02/2015   Shortness of breath 08/24/2014   GERD 05/06/2010   Constipation 05/06/2010    Past Surgical History:  Procedure Laterality Date   ABDOMINAL HYSTERECTOMY     BACK SURGERY     x3 previous lumbar, x3 cervical surgeries   CHOLECYSTECTOMY     COLONOSCOPY  05/20/10   BHA:[id number] hemorrhoids otherwise normal/EGD:distal esophageal erosions otherwise normal   CYST EXCISION Right    Eye cyst   EYE SURGERY Right    removed a cyst   LUMBAR LAMINECTOMY/DECOMPRESSION MICRODISCECTOMY N/A  07/13/2016   Procedure: DISCECTOMY Lumbar four-five;  Surgeon: Newman Pies, MD;  Location: [place];  Service: Neurosurgery;  Laterality: N/A;   MENISCUS REPAIR  2007   R knee    POSTERIOR CERVICAL FUSION/FORAMINOTOMY N/A 04/10/2013   Procedure: POSTERIOR CERVICAL FUSION/FORAMINOTOMY LEVEL 2;  Surgeon: Sinclair Ship, MD;  Location: [place];  Service: Orthopedics;  Laterality: N/A;  Posterior cervical decompression fusion, cervical 5-6, cervical 6-7 with instrumentation and allograft.   TOTAL KNEE ARTHROPLASTY Right 01/03/2021   Procedure: TOTAL KNEE ARTHROPLASTY;  Surgeon: Gaynelle Arabian, MD;  Location: [facility];  Service: Orthopedics;  Laterality: Right;  95min   TUBAL LIGATION       OB History   No obstetric history on file.     Family History  Problem Relation Age of Onset   Hypertension Mother    Emphysema Mother    Cancer Father    Hypertension Other    Diabetes Other    Colon cancer Neg Hx    Colon polyps Neg Hx     Social History   Tobacco Use   Smoking status: Former    Types: Cigarettes    Quit date: 08/25/2007    Years since quitting: 13.8   Smokeless tobacco: Never  Vaping Use   Vaping Use: Never used  Substance Use Topics   Alcohol use: No    Alcohol/week: 0.0 standard drinks   Drug use: No  Home Medications Prior to Admission medications   Medication Sig Start Date End Date Taking? Authorizing Provider  cyclobenzaprine (FLEXERIL) 10 MG tablet Take 1 tablet (10 mg total) by mouth 2 (two) times daily as needed for muscle spasms. 06/15/21  Yes Marcello Fennel, PA-C  albuterol (PROVENTIL HFA;VENTOLIN HFA) 108 (90 Base) MCG/ACT inhaler Inhale 2 puffs into the lungs every 6 (six) hours as needed for wheezing or shortness of breath.     [provider]  ALPRAZolam Duanne Moron) 1 MG tablet Take 1 mg by mouth as needed for anxiety.    [provider]  aspirin EC 81 MG tablet Take 81 mg by mouth daily. Swallow whole.    [provider]   cholecalciferol (VITAMIN D3) 25 MCG (1000 UNIT) tablet Take 1,000 Units by mouth daily.    [provider]  estrogens, conjugated, (PREMARIN) 0.625 MG tablet Take 0.625 mg by mouth daily with breakfast.     [provider]  furosemide (LASIX) 20 MG tablet Take 20 mg by mouth daily as needed for edema.     [provider]  linaclotide (LINZESS) 145 MCG CAPS capsule Take 290 mcg by mouth daily. Takes Linzess 290 mcg daily.    [provider]  oxyCODONE-acetaminophen (PERCOCET) 10-325 MG tablet Take 1 tablet by mouth every 4 (four) hours as needed. 05/02/21   [provider]  pantoprazole (PROTONIX) 40 MG tablet Take 1 tablet by mouth daily.    [provider]  promethazine (PHENERGAN) 25 MG tablet Take 25 mg by mouth as needed for nausea or vomiting.    [provider]  traMADol (ULTRAM) 50 MG tablet Take 1-2 tablets (50-100 mg total) by mouth every 6 (six) hours as needed for moderate pain. Patient taking differently: Take 50-100 mg by mouth as needed for moderate pain. 01/04/21   Edmisten, Ok Anis, PA  vitamin B-12 (CYANOCOBALAMIN) 1000 MCG tablet Take 1,000 mcg by mouth daily.    [provider]  vitamin C (ASCORBIC ACID) 500 MG tablet Take 500 mg by mouth daily.    [provider]    Allergies    Clindamycin hcl and Codeine  Review of Systems   Review of Systems  Constitutional:  Negative for chills and fever.  HENT:  Negative for congestion.   Respiratory:  Negative for shortness of breath.   Cardiovascular:  Negative for chest pain.  Gastrointestinal:  Negative for abdominal pain.  Genitourinary:  Negative for enuresis.  Musculoskeletal:  Positive for neck pain. Negative for back pain.  Skin:  Negative for rash.  Neurological:  Negative for dizziness.  Hematological:  Does not bruise/bleed easily.   Physical Exam Updated Vital Signs BP (!) 147/78   Pulse 68   Temp 97.7 F (36.5 C) (Oral)   Resp  16   Ht 5\' 2"  (1.575 m)   Wt 64.4 kg   SpO2 99%   BMI 25.97 kg/m   Physical Exam Vitals and nursing note reviewed.  Constitutional:      General: She is not in acute distress.    Appearance: She is not ill-appearing.  HENT:     Head: Normocephalic and atraumatic.     Nose: No congestion.  Eyes:     Extraocular Movements: Extraocular movements intact.     Conjunctiva/sclera: Conjunctivae normal.  Neck:     Comments: Patient has noted neck rigidity, limited range of motion secondary to pain, spine was palpated was nontender to palpation, she has no tenderness surrounding the  musculature around the cervical spine. Cardiovascular:     Rate and Rhythm: Normal rate and regular rhythm.     Pulses: Normal pulses.     Heart sounds: No murmur heard.   No friction rub. No gallop.  Pulmonary:     Effort: No respiratory distress.     Breath sounds: No wheezing, rhonchi or rales.  Musculoskeletal:     Comments: Spine was palpated nontender to palpation, no step-off or deformities present.  Patient has full range of motion, 5 of 5 strength the upper lower extremities.  Skin:    General: Skin is warm and dry.  Neurological:     Mental Status: She is alert.     Comments: No facial asymmetry, no difficulty word finding, able to follow two-step commands, no unilateral weakness noted.  Psychiatric:        Mood and Affect: Mood normal.    ED Results / Procedures / Treatments   Labs (all labs ordered are listed, but only abnormal results are displayed) Labs Reviewed - No data to display  EKG None  Radiology CT Cervical Spine Wo Contrast  Result Date: 06/15/2021 CLINICAL DATA:  Neck pain beginning yesterday on wakening. Previous neck surgery, most recently 2011. Compression fracture, cervical spine EXAM: CT CERVICAL SPINE WITHOUT CONTRAST TECHNIQUE: Multidetector CT imaging of the cervical spine was performed without intravenous contrast. Multiplanar CT image reconstructions were also  generated. COMPARISON:  CT 10/25/2017.  Myelography 10/25/2017. FINDINGS: Alignment: No malalignment. Skull base and vertebrae: Previous ACDF C4-5. Previous anterior and posterior fusion C5-C7. Soft tissues and spinal canal: No soft tissue neck lesion seen. Disc levels: Foramen magnum is widely patent. Ordinary mild osteoarthritis of the C1-2 articulation without encroachment upon the neural structures. C2-3: Bulging of the disc. No significant narrowing of the canal or foramina. C3-4: Bulging of the disc. Narrowing of the ventral subarachnoid space but no compressive narrowing of the canal. Mild bony foraminal narrowing on the left. C4-5: Previous anterior cervical discectomy and fusion. Limited detail because of artifact from the interbody spacer. No complicating feature suspected. C5 through C7: Previous posterior fusion and anterior fusion. Pedicle screws and posterior rods with screws at C5 and C7. No complicating feature seen in this region. Sufficient patency of the canal and foramina. C7-T1: Normal appearing interspace. Upper chest: Negative Other: None IMPRESSION: No acute finding. Previous ACDF at C4-5. Previous anterior and posterior fusion from C5-C7. Satisfactory appearance in the operative region allowing for hardware artifact. Mild bulging of the discs at C2-3 and C3-4. No compressive canal stenosis. Mild foraminal narrowing on the left at C3-4. Electronically Signed   By: Nelson Chimes M.D.   On: 06/15/2021 14:46    Procedures Procedures   Medications Ordered in ED Medications  cyclobenzaprine (FLEXERIL) tablet 5 mg (5 mg Oral Given 06/15/21 1521)    ED Course  I have reviewed the triage vital signs and the nursing notes.  Pertinent labs & imaging results that were available during my care of the patient were reviewed by me and considered in my medical decision making (see chart for details).    MDM Rules/Calculators/A&P                          Initial impression-patient presents  with neck pain.  She is alert, does not appear in acute stress, vital signs are reassuring.  Work-up-CT of cervical spine reveals no acute abnormalities, shows previous ACDF at C4-C5, previous anterior and posterior fusion from  C5-C7, mild bulging disc at C2-C3 C3-C4.  Reassessment-patient had requested imaging for her neck pain, explained that there is a very low likelihood that this is a fracture, dislocation, as there is no associated trauma to the area.  Likely this is a muscular strain but patient would prefer imaging we will order this for further evaluation.  Rule out- I have low suspicion for spinal fracture or spinal cord abnormality as patient denies urinary incontinency, retention, difficulty with bowel movements, denies saddle paresthesias.  Spine was palpated there is no step-off, crepitus or gross deformities felt, patient had 5/5 strength, full range of motion, neurovascular fully intact in the upper and lower extremities.  Imaging negative for fractures or dislocation.  Low suspicion for septic arthritis as patient denies IV drug use, skin exam was performed no erythematous, edema or warm joints noted.  Low suspicion for CVA or intracranial head bleed there is no focal deficits present my exam.  Low suspicion for AAA and/or dissection of the vertebral/carotid artery as presentation is atypical of etiology, imaging shows bulging disc which is consistent with patient's pain.   Plan-  Acute on chronic neck pain-likely patient has chronic pain which was exacerbated by a muscular strain, as she has had mild disc bulging seen on previous scans.  will start her on a muscle relaxer, follow-up with neurosurgery for further evaluation.  Vital signs have remained stable, no indication for hospital admission.    Patient given at home care as well strict return precautions.  Patient verbalized that they understood agreed to said plan.  Final Clinical Impression(s) / ED Diagnoses Final diagnoses:   Neck pain    Rx / DC Orders ED Discharge Orders          Ordered    cyclobenzaprine (FLEXERIL) 10 MG tablet  2 times daily PRN        06/15/21 1530             Marcello Fennel, PA-C 06/15/21 1531    Lajean Saver, MD 06/16/21 1400

## 2021-06-16 DIAGNOSIS — G894 Chronic pain syndrome: Secondary | ICD-10-CM | POA: Diagnosis not present

## 2021-06-16 DIAGNOSIS — M5136 Other intervertebral disc degeneration, lumbar region: Secondary | ICD-10-CM | POA: Diagnosis not present

## 2021-06-29 DIAGNOSIS — M5136 Other intervertebral disc degeneration, lumbar region: Secondary | ICD-10-CM | POA: Diagnosis not present

## 2021-06-29 DIAGNOSIS — G894 Chronic pain syndrome: Secondary | ICD-10-CM | POA: Diagnosis not present

## 2021-06-29 DIAGNOSIS — M1991 Primary osteoarthritis, unspecified site: Secondary | ICD-10-CM | POA: Diagnosis not present

## 2021-07-21 ENCOUNTER — Other Ambulatory Visit: Payer: Self-pay

## 2021-07-21 ENCOUNTER — Other Ambulatory Visit: Payer: Self-pay | Admitting: Gastroenterology

## 2021-07-21 ENCOUNTER — Ambulatory Visit: Payer: Medicare HMO | Admitting: Gastroenterology

## 2021-07-21 ENCOUNTER — Encounter: Payer: Self-pay | Admitting: Gastroenterology

## 2021-07-21 ENCOUNTER — Telehealth: Payer: Self-pay

## 2021-07-21 VITALS — BP 132/69 | HR 63 | Temp 96.9°F | Ht 62.0 in | Wt 141.4 lb

## 2021-07-21 DIAGNOSIS — K6289 Other specified diseases of anus and rectum: Secondary | ICD-10-CM | POA: Diagnosis not present

## 2021-07-21 DIAGNOSIS — K219 Gastro-esophageal reflux disease without esophagitis: Secondary | ICD-10-CM

## 2021-07-21 DIAGNOSIS — Z1211 Encounter for screening for malignant neoplasm of colon: Secondary | ICD-10-CM

## 2021-07-21 DIAGNOSIS — K5900 Constipation, unspecified: Secondary | ICD-10-CM | POA: Diagnosis not present

## 2021-07-21 MED ORDER — PEG 3350-KCL-NA BICARB-NACL 420 G PO SOLR: 4000.0000 mL | ORAL | 0 refills | Status: DC

## 2021-07-21 MED ORDER — HYDROCORTISONE (PERIANAL) 2.5 % EX CREA: 1.0000 "application " | TOPICAL_CREAM | Freq: Two times a day (BID) | CUTANEOUS | 1 refills | Status: DC

## 2021-07-21 MED ORDER — LUBIPROSTONE 24 MCG PO CAPS: 24.0000 ug | ORAL_CAPSULE | Freq: Two times a day (BID) | ORAL | 3 refills | Status: DC

## 2021-07-21 NOTE — Patient Instructions (Signed)
I have sent in a prescription to [facility] for a specialized compound cream to treat hemorrhoids and possible anal fissure. Use this 3-4 times per day with gloves. Make sure to wash hands after, as the nitroglycerin in it can cause dizziness and headaches. Use for at least 2 weeks. Continue to use for at least another week after symptoms have resolved.  For constipation: I have sent in Amitiza to take twice a day WITH FOOD to avoid nausea. Stop taking Linzess. Let me know how this works!  We are arranging a colonoscopy in the near future!  Further recommendations to follow!  It was a pleasure to see you today. I want to create trusting relationships with patients to provide genuine, compassionate, and quality care. I value your feedback. If you receive a survey regarding your visit,  I greatly appreciate you taking time to fill this out.   Annitta Needs, PhD, ANP-BC [facility]

## 2021-07-21 NOTE — Telephone Encounter (Signed)
PA for TCS submitted via [employer] website. [employer]# [id number], valid 08/15/21-09/14/21.

## 2021-07-21 NOTE — Progress Notes (Signed)
Primary Care Physician:  Redmond School, MD Primary Gastroenterologist:  Dr. Gala Romney  Chief Complaint  Patient presents with   Consult    Due for TCS   Constipation    Varies, can have BM's daily or up to 3-4 times per week   Hemorrhoids    Burning, itching, painful. no bleeding    HPI:   Gina Wolfe is a 65 y.o. female presenting today to arrange screening colonoscopy. She was brought in due to need for Propofol, concerns for hemorrhoids, and constipation. Last colonoscopy in 2011 with diverticulosis and hemorrhoids. No polyps.   Has been on Linzess 290 chronically. BM 3-4 times per week and sometimes less. Has to strain. Miralax just bloats her. On chronic narcotics. No rectal bleeding. Chronic history of hemorrhoids. Has burning, itching, prolapsing. Preparation H used to help but now symptoms worsening. Some knife like pain with BMs. Chronic LLQ pain that is relieved with BMs.   GERD controlled on Protonix.    Past Medical History:  Diagnosis Date   Arthritis    Asthma    Chronic pain syndrome    Diverticulitis    GERD (gastroesophageal reflux disease)    IBS (irritable bowel syndrome)    PONV (postoperative nausea and vomiting)    Spinal headache     Past Surgical History:  Procedure Laterality Date   ABDOMINAL HYSTERECTOMY     BACK SURGERY     x3 previous lumbar, x3 cervical surgeries   CHOLECYSTECTOMY     COLONOSCOPY  05/20/10   WGN:[id number] hemorrhoids otherwise normal/EGD:distal esophageal erosions otherwise normal   CYST EXCISION Right    Eye cyst   EYE SURGERY Right    removed a cyst   LUMBAR LAMINECTOMY/DECOMPRESSION MICRODISCECTOMY N/A 07/13/2016   Procedure: DISCECTOMY Lumbar four-five;  Surgeon: Newman Pies, MD;  Location: [place];  Service: Neurosurgery;  Laterality: N/A;   MENISCUS REPAIR  2007   R knee    POSTERIOR CERVICAL FUSION/FORAMINOTOMY N/A 04/10/2013   Procedure: POSTERIOR CERVICAL FUSION/FORAMINOTOMY LEVEL 2;  Surgeon: Sinclair Ship, MD;  Location: [place];  Service: Orthopedics;  Laterality: N/A;  Posterior cervical decompression fusion, cervical 5-6, cervical 6-7 with instrumentation and allograft.   TOTAL KNEE ARTHROPLASTY Right 01/03/2021   Procedure: TOTAL KNEE ARTHROPLASTY;  Surgeon: Gaynelle Arabian, MD;  Location: [facility];  Service: Orthopedics;  Laterality: Right;  24min   TUBAL LIGATION      Current Outpatient Medications  Medication Sig Dispense Refill   albuterol (PROVENTIL HFA;VENTOLIN HFA) 108 (90 Base) MCG/ACT inhaler Inhale 2 puffs into the lungs every 6 (six) hours as needed for wheezing or shortness of breath.      ALPRAZolam (XANAX) 1 MG tablet Take 1 mg by mouth as needed for anxiety.     aspirin EC 81 MG tablet Take 81 mg by mouth daily. Swallow whole.     cholecalciferol (VITAMIN D3) 25 MCG (1000 UNIT) tablet Take 1,000 Units by mouth daily.     cyclobenzaprine (FLEXERIL) 10 MG tablet Take 1 tablet (10 mg total) by mouth 2 (two) times daily as needed for muscle spasms. 20 tablet 0   estrogens, conjugated, (PREMARIN) 0.625 MG tablet Take 0.625 mg by mouth daily with breakfast.      furosemide (LASIX) 20 MG tablet Take 20 mg by mouth daily as needed for edema.      hydrocortisone (ANUSOL-HC) 2.5 % rectal cream Place 1 application rectally 2 (two) times daily. 30 g 1  linaclotide (LINZESS) 145 MCG CAPS capsule Take 290 mcg by mouth daily. Takes Linzess 290 mcg daily.     lubiprostone (AMITIZA) 24 MCG capsule Take 1 capsule (24 mcg total) by mouth 2 (two) times daily with a meal. 60 capsule 3   oxyCODONE-acetaminophen (PERCOCET) 10-325 MG tablet Take 1 tablet by mouth every 4 (four) hours as needed.     pantoprazole (PROTONIX) 40 MG tablet Take 1 tablet by mouth daily.     promethazine (PHENERGAN) 25 MG tablet Take 25 mg by mouth as needed for nausea or vomiting.     traMADol (ULTRAM) 50 MG tablet Take 1-2 tablets (50-100 mg total) by mouth every 6 (six) hours as needed for moderate pain.  (Patient taking differently: Take 50-100 mg by mouth as needed for moderate pain.) 40 tablet 0   vitamin B-12 (CYANOCOBALAMIN) 1000 MCG tablet Take 1,000 mcg by mouth daily.     vitamin C (ASCORBIC ACID) 500 MG tablet Take 500 mg by mouth daily.     No current facility-administered medications for this visit.    Allergies as of 07/21/2021 - Review Complete 07/21/2021  Allergen Reaction Noted   Clindamycin hcl Anaphylaxis    Codeine Nausea Only     Family History  Problem Relation Age of Onset   Hypertension Mother    Emphysema Mother    Cancer Father    Hypertension Other    Diabetes Other    Colon cancer Neg Hx    Colon polyps Neg Hx     Social History   Socioeconomic History   Marital status: Married    Spouse name: Not on file   Number of children: Not on file   Years of education: Not on file   Highest education level: Not on file  Occupational History   Not on file  Tobacco Use   Smoking status: Former    Types: Cigarettes    Quit date: 08/25/2007    Years since quitting: 13.9   Smokeless tobacco: Never  Vaping Use   Vaping Use: Never used  Substance and Sexual Activity   Alcohol use: No    Alcohol/week: 0.0 standard drinks   Drug use: No   Sexual activity: Not on file  Other Topics Concern   Not on file  Social History Narrative   Not on file   Social Determinants of Health   Financial Resource Strain: Not on file  Food Insecurity: Not on file  Transportation Needs: Not on file  Physical Activity: Not on file  Stress: Not on file  Social Connections: Not on file  Intimate Partner Violence: Not on file    Review of Systems: Gen: Denies any fever, chills, fatigue, weight loss, lack of appetite.  CV: Denies chest pain, heart palpitations, peripheral edema, syncope.  Resp: Denies shortness of breath at rest or with exertion. Denies wheezing or cough.  GI: see HPI GU : Denies urinary burning, urinary frequency, urinary hesitancy MS: Denies joint  pain, muscle weakness, cramps, or limitation of movement.  Derm: Denies rash, itching, dry skin Psych: Denies depression, anxiety, memory loss, and confusion Heme: Denies bruising, bleeding, and enlarged lymph nodes.  Physical Exam: BP 132/69   Pulse 63   Temp (!) 96.9 F (36.1 C)   Ht 5\' 2"  (1.575 m)   Wt 141 lb 6.4 oz (64.1 kg)   BMI 25.86 kg/m  General:   Alert and oriented. Pleasant and cooperative. Well-nourished and well-developed.  Head:  Normocephalic and atraumatic. Eyes:  Without  icterus, sclera clear and conjunctiva pink.  Ears:  Normal auditory acuity. Mouth:  mask in place Lungs:  Clear to auscultation bilaterally. No wheezes, rales, or rhonchi. No distress.  Heart:  S1, S2 present without murmurs appreciated.  Abdomen:  +BS, soft, non-tender and non-distended. No HSM noted. No guarding or rebound. No masses appreciated.  Rectal:  mild external non-thrombosed hemorrhoids. No obvious fissure. Unable to complete DRE due to pain.  Msk:  Symmetrical without gross deformities. Normal posture. Extremities:  Without edema. Neurologic:  Alert and  oriented x4;  grossly normal neurologically. Skin:  Intact without significant lesions or rashes. Psych:  Alert and cooperative. Normal mood and affect.  ASSESSMENT: Gina Wolfe is a 65 y.o. female presenting today with need for routine screening colonoscopy as last was in 2011. No family history of colorectal cancer or polyps. No personal history of adenomas. She reports constipation, rectal pain, and chronic GERD.  Constipation: not ideally managed on Linzess 290 mcg daily .Miralax has not been helpful historically. As on chronic narcotics, we discussed dealing with [facility]. Will start Amitiza 24 mcg po BID with food to avoid nausea. May need Movantik if no improvement.   Rectal pain: unable to complete DRE. Known history of internal hemorrhoids. 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 I have called in [facility] cream with  nitro. Could consider banding once symptoms improve and after colonoscopy. She is unsure if she wants to pursue this.  GERD: well-controlled on pantoprazole daily.    PLAN: Proceed with colonoscopy by Dr. Gala Romney in near future using Propofol: the risks, benefits, and alternatives have been discussed with the patient in detail. The patient states understanding and desires to proceed.  Stop Linzess. Start Amitiza 24 mcg po BID with food. Call if no improvement  Extra day of clear liquids prior to colonoscopy  [place] hemorrhoid cream with nitro called in  Continue PPI  Follow-up thereafter  Consider banding in future once suspected fissure heals   Annitta Needs, PhD, ANP-BC [facility]

## 2021-07-21 NOTE — Telephone Encounter (Signed)
Gina Wolfe,  I just sent Amitiza in. Likely needs a PA I think.

## 2021-07-21 NOTE — H&P (View-Only) (Signed)
Primary Care Physician:  Redmond School, MD Primary Gastroenterologist:  Dr. Gala Romney  Chief Complaint  Patient presents with   Consult    Due for TCS   Constipation    Varies, can have BM's daily or up to 3-4 times per week   Hemorrhoids    Burning, itching, painful. no bleeding    HPI:   Gina Wolfe is a 65 y.o. female presenting today to arrange screening colonoscopy. She was brought in due to need for Propofol, concerns for hemorrhoids, and constipation. Last colonoscopy in 2011 with diverticulosis and hemorrhoids. No polyps.   Has been on Linzess 290 chronically. BM 3-4 times per week and sometimes less. Has to strain. Miralax just bloats her. On chronic narcotics. No rectal bleeding. Chronic history of hemorrhoids. Has burning, itching, prolapsing. Preparation H used to help but now symptoms worsening. Some knife like pain with BMs. Chronic LLQ pain that is relieved with BMs.   GERD controlled on Protonix.    Past Medical History:  Diagnosis Date   Arthritis    Asthma    Chronic pain syndrome    Diverticulitis    GERD (gastroesophageal reflux disease)    IBS (irritable bowel syndrome)    PONV (postoperative nausea and vomiting)    Spinal headache     Past Surgical History:  Procedure Laterality Date   ABDOMINAL HYSTERECTOMY     BACK SURGERY     x3 previous lumbar, x3 cervical surgeries   CHOLECYSTECTOMY     COLONOSCOPY  05/20/10   BHA:[id number] hemorrhoids otherwise normal/EGD:distal esophageal erosions otherwise normal   CYST EXCISION Right    Eye cyst   EYE SURGERY Right    removed a cyst   LUMBAR LAMINECTOMY/DECOMPRESSION MICRODISCECTOMY N/A 07/13/2016   Procedure: DISCECTOMY Lumbar four-five;  Surgeon: Newman Pies, MD;  Location: [facility];  Service: Neurosurgery;  Laterality: N/A;   MENISCUS REPAIR  2007   R knee    POSTERIOR CERVICAL FUSION/FORAMINOTOMY N/A 04/10/2013   Procedure: POSTERIOR CERVICAL FUSION/FORAMINOTOMY LEVEL 2;  Surgeon: Sinclair Ship, MD;  Location: [facility];  Service: Orthopedics;  Laterality: N/A;  Posterior cervical decompression fusion, cervical 5-6, cervical 6-7 with instrumentation and allograft.   TOTAL KNEE ARTHROPLASTY Right 01/03/2021   Procedure: TOTAL KNEE ARTHROPLASTY;  Surgeon: Gaynelle Arabian, MD;  Location: [facility];  Service: Orthopedics;  Laterality: Right;  33min   TUBAL LIGATION      Current Outpatient Medications  Medication Sig Dispense Refill   albuterol (PROVENTIL HFA;VENTOLIN HFA) 108 (90 Base) MCG/ACT inhaler Inhale 2 puffs into the lungs every 6 (six) hours as needed for wheezing or shortness of breath.      ALPRAZolam (XANAX) 1 MG tablet Take 1 mg by mouth as needed for anxiety.     aspirin EC 81 MG tablet Take 81 mg by mouth daily. Swallow whole.     cholecalciferol (VITAMIN D3) 25 MCG (1000 UNIT) tablet Take 1,000 Units by mouth daily.     cyclobenzaprine (FLEXERIL) 10 MG tablet Take 1 tablet (10 mg total) by mouth 2 (two) times daily as needed for muscle spasms. 20 tablet 0   estrogens, conjugated, (PREMARIN) 0.625 MG tablet Take 0.625 mg by mouth daily with breakfast.      furosemide (LASIX) 20 MG tablet Take 20 mg by mouth daily as needed for edema.      hydrocortisone (ANUSOL-HC) 2.5 % rectal cream Place 1 application rectally 2 (two) times daily. 30 g 1  linaclotide (LINZESS) 145 MCG CAPS capsule Take 290 mcg by mouth daily. Takes Linzess 290 mcg daily.     lubiprostone (AMITIZA) 24 MCG capsule Take 1 capsule (24 mcg total) by mouth 2 (two) times daily with a meal. 60 capsule 3   oxyCODONE-acetaminophen (PERCOCET) 10-325 MG tablet Take 1 tablet by mouth every 4 (four) hours as needed.     pantoprazole (PROTONIX) 40 MG tablet Take 1 tablet by mouth daily.     promethazine (PHENERGAN) 25 MG tablet Take 25 mg by mouth as needed for nausea or vomiting.     traMADol (ULTRAM) 50 MG tablet Take 1-2 tablets (50-100 mg total) by mouth every 6 (six) hours as needed for moderate pain.  (Patient taking differently: Take 50-100 mg by mouth as needed for moderate pain.) 40 tablet 0   vitamin B-12 (CYANOCOBALAMIN) 1000 MCG tablet Take 1,000 mcg by mouth daily.     vitamin C (ASCORBIC ACID) 500 MG tablet Take 500 mg by mouth daily.     No current facility-administered medications for this visit.    Allergies as of 07/21/2021 - Review Complete 07/21/2021  Allergen Reaction Noted   Clindamycin hcl Anaphylaxis    Codeine Nausea Only     Family History  Problem Relation Age of Onset   Hypertension Mother    Emphysema Mother    Cancer Father    Hypertension Other    Diabetes Other    Colon cancer Neg Hx    Colon polyps Neg Hx     Social History   Socioeconomic History   Marital status: Married    Spouse name: Not on file   Number of children: Not on file   Years of education: Not on file   Highest education level: Not on file  Occupational History   Not on file  Tobacco Use   Smoking status: Former    Types: Cigarettes    Quit date: 08/25/2007    Years since quitting: 13.9   Smokeless tobacco: Never  Vaping Use   Vaping Use: Never used  Substance and Sexual Activity   Alcohol use: No    Alcohol/week: 0.0 standard drinks   Drug use: No   Sexual activity: Not on file  Other Topics Concern   Not on file  Social History Narrative   Not on file   Social Determinants of Health   Financial Resource Strain: Not on file  Food Insecurity: Not on file  Transportation Needs: Not on file  Physical Activity: Not on file  Stress: Not on file  Social Connections: Not on file  Intimate Partner Violence: Not on file    Review of Systems: Gen: Denies any fever, chills, fatigue, weight loss, lack of appetite.  CV: Denies chest pain, heart palpitations, peripheral edema, syncope.  Resp: Denies shortness of breath at rest or with exertion. Denies wheezing or cough.  GI: see HPI GU : Denies urinary burning, urinary frequency, urinary hesitancy MS: Denies joint  pain, muscle weakness, cramps, or limitation of movement.  Derm: Denies rash, itching, dry skin Psych: Denies depression, anxiety, memory loss, and confusion Heme: Denies bruising, bleeding, and enlarged lymph nodes.  Physical Exam: BP 132/69   Pulse 63   Temp (!) 96.9 F (36.1 C)   Ht 5\' 2"  (1.575 m)   Wt 141 lb 6.4 oz (64.1 kg)   BMI 25.86 kg/m  General:   Alert and oriented. Pleasant and cooperative. Well-nourished and well-developed.  Head:  Normocephalic and atraumatic. Eyes:  Without  icterus, sclera clear and conjunctiva pink.  Ears:  Normal auditory acuity. Mouth:  mask in place Lungs:  Clear to auscultation bilaterally. No wheezes, rales, or rhonchi. No distress.  Heart:  S1, S2 present without murmurs appreciated.  Abdomen:  +BS, soft, non-tender and non-distended. No HSM noted. No guarding or rebound. No masses appreciated.  Rectal:  mild external non-thrombosed hemorrhoids. No obvious fissure. Unable to complete DRE due to pain.  Msk:  Symmetrical without gross deformities. Normal posture. Extremities:  Without edema. Neurologic:  Alert and  oriented x4;  grossly normal neurologically. Skin:  Intact without significant lesions or rashes. Psych:  Alert and cooperative. Normal mood and affect.  ASSESSMENT: Gina Wolfe is a 65 y.o. female presenting today with need for routine screening colonoscopy as last was in 2011. No family history of colorectal cancer or polyps. No personal history of adenomas. She reports constipation, rectal pain, and chronic GERD.  Constipation: not ideally managed on Linzess 290 mcg daily .Miralax has not been helpful historically. As on chronic narcotics, we discussed dealing with OIC. Will start Amitiza 24 mcg po BID with food to avoid nausea. May need Movantik if no improvement.   Rectal pain: unable to complete DRE. Known history of internal hemorrhoids. 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 I have called in [facility] cream with  nitro. Could consider banding once symptoms improve and after colonoscopy. She is unsure if she wants to pursue this.  GERD: well-controlled on pantoprazole daily.    PLAN: Proceed with colonoscopy by Dr. Gala Romney in near future using Propofol: the risks, benefits, and alternatives have been discussed with the patient in detail. The patient states understanding and desires to proceed.  Stop Linzess. Start Amitiza 24 mcg po BID with food. Call if no improvement  Extra day of clear liquids prior to colonoscopy  [place] hemorrhoid cream with nitro called in  Continue PPI  Follow-up thereafter  Consider banding in future once suspected fissure heals   Annitta Needs, PhD, ANP-BC [facility]

## 2021-07-22 NOTE — Telephone Encounter (Signed)
Phoned and asked the pt did she receive her Rx for Amitiza and was advised by her that she got a text stating that [facility] was trying to get an alternate Rx for her because her insurance doesn't cover it. I advised the pt that they have called or sent [place] anything. She advised me that she will call the pharmacy and have them contact us with fax or Cover My Meds.

## 2021-07-25 NOTE — Telephone Encounter (Signed)
Did a PA for the pt today. Tried/failed: Linzess 290 mcg, Lactulose, Miralax. (Pt on Amitiza in the past and it works for her) takes x 2 a day. Dx: chronic constipation and opoid induced constipation.

## 2021-07-26 NOTE — Telephone Encounter (Signed)
Documentation from [facility], pt has been approved to get Lubiprostone 24 mcg capsule until 09/10/2022. Will fax to the pharmacy as well and notify the pt.  [facility] [place].

## 2021-07-26 NOTE — Telephone Encounter (Signed)
PA approved for Lubiprostone 24 mcg cap until 07/2022. Pharmacy has been advised of this and to fill for the pt.

## 2021-08-01 DIAGNOSIS — M1991 Primary osteoarthritis, unspecified site: Secondary | ICD-10-CM | POA: Diagnosis not present

## 2021-08-01 DIAGNOSIS — Z6825 Body mass index (BMI) 25.0-25.9, adult: Secondary | ICD-10-CM | POA: Diagnosis not present

## 2021-08-01 DIAGNOSIS — G894 Chronic pain syndrome: Secondary | ICD-10-CM | POA: Diagnosis not present

## 2021-08-01 DIAGNOSIS — M5136 Other intervertebral disc degeneration, lumbar region: Secondary | ICD-10-CM | POA: Diagnosis not present

## 2021-08-01 DIAGNOSIS — R21 Rash and other nonspecific skin eruption: Secondary | ICD-10-CM | POA: Diagnosis not present

## 2021-08-01 DIAGNOSIS — E663 Overweight: Secondary | ICD-10-CM | POA: Diagnosis not present

## 2021-08-15 ENCOUNTER — Ambulatory Visit (HOSPITAL_COMMUNITY): Payer: Medicare HMO | Admitting: Anesthesiology

## 2021-08-15 ENCOUNTER — Other Ambulatory Visit: Payer: Self-pay

## 2021-08-15 ENCOUNTER — Encounter (HOSPITAL_COMMUNITY): Payer: Self-pay | Admitting: Internal Medicine

## 2021-08-15 ENCOUNTER — Ambulatory Visit: Payer: Medicare HMO | Admitting: Cardiology

## 2021-08-15 ENCOUNTER — Encounter (HOSPITAL_COMMUNITY)
Admission: RE | Disposition: A | Discharge status: Home / Self Care | Payer: Self-pay | Source: Ambulatory Visit | Attending: Internal Medicine

## 2021-08-15 ENCOUNTER — Ambulatory Visit (HOSPITAL_COMMUNITY)
Admission: RE | Admit: 2021-08-15 | Discharge: 2021-08-15 | Disposition: A | Discharge status: Home / Self Care | Payer: Medicare HMO | Source: Ambulatory Visit | Attending: Internal Medicine | Admitting: Internal Medicine

## 2021-08-15 DIAGNOSIS — G894 Chronic pain syndrome: Secondary | ICD-10-CM | POA: Insufficient documentation

## 2021-08-15 DIAGNOSIS — K219 Gastro-esophageal reflux disease without esophagitis: Secondary | ICD-10-CM | POA: Insufficient documentation

## 2021-08-15 DIAGNOSIS — K643 Fourth degree hemorrhoids: Secondary | ICD-10-CM | POA: Diagnosis not present

## 2021-08-15 DIAGNOSIS — Z87891 Personal history of nicotine dependence: Secondary | ICD-10-CM | POA: Insufficient documentation

## 2021-08-15 DIAGNOSIS — K5730 Diverticulosis of large intestine without perforation or abscess without bleeding: Secondary | ICD-10-CM | POA: Insufficient documentation

## 2021-08-15 DIAGNOSIS — Z1211 Encounter for screening for malignant neoplasm of colon: Secondary | ICD-10-CM | POA: Insufficient documentation

## 2021-08-15 DIAGNOSIS — J45909 Unspecified asthma, uncomplicated: Secondary | ICD-10-CM | POA: Diagnosis not present

## 2021-08-15 DIAGNOSIS — K644 Residual hemorrhoidal skin tags: Secondary | ICD-10-CM | POA: Insufficient documentation

## 2021-08-15 HISTORY — PX: COLONOSCOPY WITH PROPOFOL: SHX5780

## 2021-08-15 SURGERY — COLONOSCOPY WITH PROPOFOL
Anesthesia: General

## 2021-08-15 MED ORDER — PROPOFOL 10 MG/ML IV BOLUS
INTRAVENOUS | Status: DC | PRN
  Administered 2021-08-15: 80 mg via INTRAVENOUS

## 2021-08-15 MED ORDER — SIMETHICONE 40 MG/0.6ML PO SUSP
ORAL | Status: AC
  Filled 2021-08-15: qty 0.6

## 2021-08-15 MED ORDER — PROPOFOL 1000 MG/100ML IV EMUL
INTRAVENOUS | Status: AC
  Filled 2021-08-15: qty 100

## 2021-08-15 MED ORDER — LIDOCAINE HCL 1 % IJ SOLN
INTRAMUSCULAR | Status: DC | PRN
  Administered 2021-08-15: 50 mg via INTRADERMAL

## 2021-08-15 MED ORDER — PROPOFOL 10 MG/ML IV BOLUS
INTRAVENOUS | Status: AC
  Filled 2021-08-15: qty 20

## 2021-08-15 MED ORDER — LACTATED RINGERS IV SOLN: INTRAVENOUS | Status: DC

## 2021-08-15 MED ORDER — SIMETHICONE 40 MG/0.6ML PO SUSP
40.0000 mg | Freq: Once | ORAL | Status: AC
  Administered 2021-08-15: 40 mg via ORAL

## 2021-08-15 MED ORDER — PROPOFOL 500 MG/50ML IV EMUL
INTRAVENOUS | Status: DC | PRN
  Administered 2021-08-15: 150 ug/kg/min via INTRAVENOUS

## 2021-08-15 NOTE — Progress Notes (Addendum)
Pt states LLQ pain is "getting better" after walking and mylicon.  Pain now 3/10.  Pt states she has chronic abdominal pain. Dr. Gala Romney informed.  OK to discharge home.  Instructed pt to call MD or return to ED for increasing abd pain, distention, fever, or N/V.  Pt verbalized understanding.

## 2021-08-15 NOTE — Op Note (Signed)
[facility] Patient Name: Gina Wolfe Procedure Date: 08/15/2021 7:15 AM MRN: [id number] Date of Birth: 22-Jul-1956 Attending MD: Norvel Richards , MD CSN: [id number] Age: 65 Admit Type: Outpatient Procedure:                Colonoscopy Indications:              Screening for colorectal malignant neoplasm Providers:                Norvel Richards, MD, Rosina Lowenstein, RN Referring MD:              Medicines:                Propofol per Anesthesia Complications:            No immediate complications. Estimated Blood Loss:     Estimated blood loss: none. Procedure:                Pre-Anesthesia Assessment:                           - Prior to the procedure, a History and Physical                            was performed, and patient medications and                            allergies were reviewed. The patient's tolerance of                            previous anesthesia was also reviewed. The risks                            and benefits of the procedure and the sedation                            options and risks were discussed with the patient.                            All questions were answered, and informed consent                            was obtained. Prior Anticoagulants: The patient has                            taken no previous anticoagulant or antiplatelet                            agents. ASA Grade Assessment: II - A patient with                            mild systemic disease. After reviewing the risks                            and benefits, the patient was deemed in  satisfactory condition to undergo the procedure.                           After obtaining informed consent, the colonoscope                            was passed under direct vision. Throughout the                            procedure, the patient's blood pressure, pulse, and                            oxygen saturations were monitored continuously. The                             [id number]) scope was introduced through the                            anus and advanced to the the cecum, identified by                            appendiceal orifice and ileocecal valve. The                            colonoscopy was performed without difficulty. The                            patient tolerated the procedure well. The quality                            of the bowel preparation was adequate. Scope In: 7:41:17 AM Scope Out: 7:52:17 AM Scope Withdrawal Time: 0 hours 6 minutes 31 seconds  Total Procedure Duration: 0 hours 11 minutes 0 seconds  Findings:      The perianal and digital rectal examinations were normal.      Scattered small and large-mouthed diverticula were found in the sigmoid       colon.      The exam was otherwise without abnormality on direct and retroflexion       views. Rectal vault too small to retroflex. Seen well on?face.      Non-bleeding external and internal hemorrhoids were found. The       hemorrhoids were moderate, medium-sized and Grade IV (internal       hemorrhoids that prolapse and cannot be reduced manually).      The exam was otherwise without abnormality. Impression:               - Moderate diverticulosis in the sigmoid colon.                           - The examination was otherwise normal on direct                            and retroflexion views.                           -  Non-bleeding external and internal hemorrhoids.                           - The examination was otherwise normal.                           - No specimens collected. Moderate Sedation:      Moderate (conscious) sedation was personally administered by an       anesthesia professional. The following parameters were monitored: oxygen       saturation, heart rate, blood pressure, respiratory rate, EKG, adequacy       of pulmonary ventilation, and response to care. Recommendation:           - Patient has a contact number available for                             emergencies. The signs and symptoms of potential                            delayed complications were discussed with the                            patient. Return to normal activities tomorrow.                            Written discharge instructions were provided to the                            patient.                           - Advance diet as tolerated.                           - Continue present medications.                           - Repeat colonoscopy in 10 years for screening                            purposes.                           - Return to GI office in 3 months. Begin Amitiza 24                            twice daily. Try taking kiwi fruit daily to                            facilitate bloating and bowel evacuation. Procedure Code(s):        --- Professional ---                           [phone], Colonoscopy, flexible; diagnostic, including  collection of specimen(s) by brushing or washing,                            when performed (separate procedure) Diagnosis Code(s):        --- Professional ---                           Z12.11, Encounter for screening for malignant                            neoplasm of colon                           K64.3, Fourth degree hemorrhoids                           K57.30, Diverticulosis of large intestine without                            perforation or abscess without bleeding CPT copyright 2019 American Medical Association. All rights reserved. The codes documented in this report are preliminary and upon coder review may  be revised to meet current compliance requirements. Cristopher Estimable. Abriana Saltos, MD Norvel Richards, MD 08/15/2021 8:06:16 AM This report has been signed electronically. Number of Addenda: 0

## 2021-08-15 NOTE — Anesthesia Postprocedure Evaluation (Signed)
Anesthesia Post Note  Patient: Gina Wolfe  Procedure(s) Performed: COLONOSCOPY WITH PROPOFOL  Patient location during evaluation: Endoscopy Anesthesia Type: General Level of consciousness: awake and alert Pain management: pain level controlled Vital Signs Assessment: post-procedure vital signs reviewed and stable Respiratory status: spontaneous breathing Cardiovascular status: blood pressure returned to baseline and stable Postop Assessment: no apparent nausea or vomiting Anesthetic complications: no   No notable events documented.   Last Vitals:  Vitals:   08/15/21 0656 08/15/21 0758  BP: 136/75 (!) 104/55  Pulse: (!) 59   Resp: 12 12  Temp: 36.6 C 36.4 C  SpO2: 100% 100%    Last Pain:  Vitals:   08/15/21 0758  TempSrc: Oral  PainSc: 4                  Krysten Veronica

## 2021-08-15 NOTE — Progress Notes (Signed)
Pt c/o LLQ pain 4/10.  Describes pain as dull ache.  Note pt constantly rubbing left abdomen with palm of hand.  Dr. Gala Romney informed, mylicon given per orders.  Will continue to monitor.

## 2021-08-15 NOTE — Anesthesia Preprocedure Evaluation (Addendum)
Anesthesia Evaluation  Patient identified by MRN, date of birth, ID band Patient awake    Reviewed: Allergy & Precautions, NPO status , Patient's Chart, lab work & pertinent test results  History of Anesthesia Complications (+) PONV, POST - OP SPINAL HEADACHE and history of anesthetic complications  Airway Mallampati: III  TM Distance: >3 FB Neck ROM: Full   Comment: Posterior cervical fusion and foraminotomy Dental  (+) Dental Advisory Given, Partial Upper   Pulmonary shortness of breath, asthma , former smoker,    Pulmonary exam normal breath sounds clear to auscultation       Cardiovascular Normal cardiovascular exam Rhythm:Regular Rate:Normal     Neuro/Psych  Headaches,    GI/Hepatic Neg liver ROS, GERD  Medicated,  Endo/Other  negative endocrine ROS  Renal/GU negative Renal ROS     Musculoskeletal  (+) Arthritis , Osteoarthritis,    Abdominal   Peds  Hematology negative hematology ROS (+)   Anesthesia Other Findings Chronic pain syndrome  Posterior cervical fusion and foraminotomy   Reproductive/Obstetrics                           Anesthesia Physical Anesthesia Plan  ASA: 2  Anesthesia Plan: General   Post-op Pain Management: Minimal or no pain anticipated   Induction:   PONV Risk Score and Plan: TIVA  Airway Management Planned: Nasal Cannula and Natural Airway  Additional Equipment:   Intra-op Plan:   Post-operative Plan:   Informed Consent: I have reviewed the patients History and Physical, chart, labs and discussed the procedure including the risks, benefits and alternatives for the proposed anesthesia with the patient or authorized representative who has indicated his/her understanding and acceptance.     Dental advisory given  Plan Discussed with: CRNA and Surgeon  Anesthesia Plan Comments:         Anesthesia Quick Evaluation

## 2021-08-15 NOTE — Discharge Instructions (Addendum)
  Colonoscopy Discharge Instructions  Read the instructions outlined below and refer to this sheet in the next few weeks. These discharge instructions provide you with general information on caring for yourself after you leave the hospital. Your doctor may also give you specific instructions. While your treatment has been planned according to the most current medical practices available, unavoidable complications occasionally occur. If you have any problems or questions after discharge, call Dr. Gala Romney at [phone]. ACTIVITY You may resume your regular activity, but move at a slower pace for the next 24 hours.  Take frequent rest periods for the next 24 hours.  Walking will help get rid of the air and reduce the bloated feeling in your belly (abdomen).  No driving for 24 hours (because of the medicine (anesthesia) used during the test).   Do not sign any important legal documents or operate any machinery for 24 hours (because of the anesthesia used during the test).  NUTRITION Drink plenty of fluids.  You may resume your normal diet as instructed by your doctor.  Begin with a light meal and progress to your normal diet. Heavy or fried foods are harder to digest and may make you feel sick to your stomach (nauseated).  Avoid alcoholic beverages for 24 hours or as instructed.  MEDICATIONS You may resume your normal medications unless your doctor tells you otherwise.  WHAT YOU CAN EXPECT TODAY Some feelings of bloating in the abdomen.  Passage of more gas than usual.  Spotting of blood in your stool or on the toilet paper.  IF YOU HAD POLYPS REMOVED DURING THE COLONOSCOPY: No aspirin products for 7 days or as instructed.  No alcohol for 7 days or as instructed.  Eat a soft diet for the next 24 hours.  FINDING OUT THE RESULTS OF YOUR TEST Not all test results are available during your visit. If your test results are not back during the visit, make an appointment with your caregiver to find out the  results. Do not assume everything is normal if you have not heard from your caregiver or the medical facility. It is important for you to follow up on all of your test results.  SEEK IMMEDIATE MEDICAL ATTENTION IF: You have more than a spotting of blood in your stool.  Your belly is swollen (abdominal distention).  You are nauseated or vomiting.  You have a temperature over 101.  You have abdominal pain or discomfort that is severe or gets worse throughout the day.    Diverticulosis and hemorrhoid information provided  No polyps found today  Recommend repeat colonoscopy in 10 years for screening  Begin Amitiza 24 mcg twice daily.  You have the prescription.  Take the medication during meal to avoid nausea  Try taking kiwi fruit 1/2-2 fruit daily to facilitate bowel function and bloating symptoms  Office visit with Arvil Chaco in 3 months- CALL OFFICE TO SCHEDULE  At patient request, I called Richardson Landry at [phone] findings and recommendations

## 2021-08-15 NOTE — Interval H&P Note (Signed)
History and Physical Interval Note:  08/15/2021 7:19 AM  Gina Wolfe  has presented today for surgery, with the diagnosis of screening colonoscopy.  The various methods of treatment have been discussed with the patient and family. After consideration of risks, benefits and other options for treatment, the patient has consented to  Procedure(s) with comments: COLONOSCOPY WITH PROPOFOL (N/A) - 7:30am as a surgical intervention.  The patient's history has been reviewed, patient examined, no change in status, stable for surgery.  I have reviewed the patient's chart and labs.  Questions were answered to the patient's satisfaction.     Gina Wolfe  No change.  Amitiza just approved.  Has not gotten it filled yet.  Headache with nitroglycerin.  Anorectal symptoms better.  Linzess 290 apprising only only modestly effective at controlling constipation.  Screening colonoscopy today per plan. The risks, benefits, limitations, alternatives and imponderables have been reviewed with the patient. Questions have been answered. All parties are agreeable.

## 2021-08-15 NOTE — Transfer of Care (Signed)
Immediate Anesthesia Transfer of Care Note  Patient: Gina Wolfe  Procedure(s) Performed: COLONOSCOPY WITH PROPOFOL  Patient Location: Endoscopy Unit  Anesthesia Type:General  Level of Consciousness: awake  Airway & Oxygen Therapy: Patient Spontanous Breathing  Post-op Assessment: Report given to RN  Post vital signs: Reviewed  Last Vitals:  Vitals Value Taken Time  BP 104/55 08/15/21 0758  Temp 36.4 C 08/15/21 0758  Pulse    Resp 12 08/15/21 0758  SpO2 100 % 08/15/21 0758    Last Pain:  Vitals:   08/15/21 0758  TempSrc: Oral  PainSc: 4       Patients Stated Pain Goal: 7 (57/01/77 9390)  Complications: No notable events documented.

## 2021-08-18 ENCOUNTER — Encounter (HOSPITAL_COMMUNITY): Payer: Self-pay | Admitting: Internal Medicine

## 2021-08-26 ENCOUNTER — Ambulatory Visit: Payer: Medicare HMO | Admitting: Cardiology

## 2021-08-29 DIAGNOSIS — K219 Gastro-esophageal reflux disease without esophagitis: Secondary | ICD-10-CM | POA: Diagnosis not present

## 2021-08-29 DIAGNOSIS — K5732 Diverticulitis of large intestine without perforation or abscess without bleeding: Secondary | ICD-10-CM | POA: Diagnosis not present

## 2021-08-29 DIAGNOSIS — F1120 Opioid dependence, uncomplicated: Secondary | ICD-10-CM | POA: Diagnosis not present

## 2021-08-29 DIAGNOSIS — G894 Chronic pain syndrome: Secondary | ICD-10-CM | POA: Diagnosis not present

## 2021-09-13 ENCOUNTER — Ambulatory Visit: Payer: Medicare HMO | Admitting: Cardiology

## 2021-09-13 ENCOUNTER — Encounter: Payer: Self-pay | Admitting: *Deleted

## 2021-09-13 ENCOUNTER — Encounter: Payer: Self-pay | Admitting: Cardiology

## 2021-09-13 VITALS — BP 124/70 | HR 60 | Ht 62.0 in | Wt 142.0 lb

## 2021-09-13 DIAGNOSIS — R002 Palpitations: Secondary | ICD-10-CM

## 2021-09-13 DIAGNOSIS — R0602 Shortness of breath: Secondary | ICD-10-CM | POA: Diagnosis not present

## 2021-09-13 DIAGNOSIS — R0789 Other chest pain: Secondary | ICD-10-CM

## 2021-09-13 NOTE — Progress Notes (Signed)
Cardiology Office Note  Date: 09/13/2021   ID: Gina Wolfe, DOB September 12, 1955, MRN [id number]  PCP:  Redmond School, MD  Cardiologist:  Rozann Lesches, MD Electrophysiologist:  None   Chief Complaint  Patient presents with   Palpitations    History of Present Illness: Gina Wolfe is a 66 y.o. female referred for cardiology consultation by Dr. Gerarda Fraction for the evaluation of chest pain.  We discussed her symptoms today, she actually complains more of a sense of palpitations, fast forceful heartbeat associated with shortness of breath and dizziness.  This has been going on for the last year.  Not associated specifically with exertion or exertional chest pain.  Episodes can occur a few times a day or few times in a week, no obvious precipitant.  She has had no frank syncope.  Otherwise reports no major health change other than undergoing left knee replacement surgery earlier last year.  I reviewed her medications which are noted below.  ECG from August 2022 showed sinus rhythm with low voltage.  She has no prior documented history of cardiac arrhythmia.  She did undergo cardiac structural and ischemic testing in 2015, reassuring.  Past Medical History:  Diagnosis Date   Arthritis    Asthma    Chronic pain syndrome    Diverticulitis    GERD (gastroesophageal reflux disease)    IBS (irritable bowel syndrome)    Spinal headache     Past Surgical History:  Procedure Laterality Date   ABDOMINAL HYSTERECTOMY     BACK SURGERY     x3 previous lumbar, x3 cervical surgeries   CHOLECYSTECTOMY     COLONOSCOPY  05/20/10   OVF:IEPPIRJJ hemorrhoids otherwise normal/EGD:distal esophageal erosions otherwise normal   COLONOSCOPY WITH PROPOFOL N/A 08/15/2021   Procedure: COLONOSCOPY WITH PROPOFOL;  Surgeon: Daneil Dolin, MD;  [facility];  Service: Endoscopy;  Laterality: N/A;  7:30am   CYST EXCISION Right    Eye cyst   EYE SURGERY Right    removed a cyst   LUMBAR  LAMINECTOMY/DECOMPRESSION MICRODISCECTOMY N/A 07/13/2016   Procedure: DISCECTOMY Lumbar four-five;  Surgeon: Newman Pies, MD;  Location: [place];  Service: Neurosurgery;  Laterality: N/A;   MENISCUS REPAIR  2007   R knee    POSTERIOR CERVICAL FUSION/FORAMINOTOMY N/A 04/10/2013   Procedure: POSTERIOR CERVICAL FUSION/FORAMINOTOMY LEVEL 2;  Surgeon: Sinclair Ship, MD;  Location: [place];  Service: Orthopedics;  Laterality: N/A;  Posterior cervical decompression fusion, cervical 5-6, cervical 6-7 with instrumentation and allograft.   TOTAL KNEE ARTHROPLASTY Right 01/03/2021   Procedure: TOTAL KNEE ARTHROPLASTY;  Surgeon: Gaynelle Arabian, MD;  [facility];  Service: Orthopedics;  Laterality: Right;  67min   TUBAL LIGATION      Current Outpatient Medications  Medication Sig Dispense Refill   albuterol (PROVENTIL HFA;VENTOLIN HFA) 108 (90 Base) MCG/ACT inhaler Inhale 2 puffs into the lungs every 6 (six) hours as needed for wheezing or shortness of breath.      ALPRAZolam (XANAX) 1 MG tablet Take 1 mg by mouth as needed for anxiety.     aspirin EC 81 MG tablet Take 81 mg by mouth daily. Swallow whole.     cholecalciferol (VITAMIN D3) 25 MCG (1000 UNIT) tablet Take 1,000 Units by mouth daily.     cyclobenzaprine (FLEXERIL) 10 MG tablet Take 1 tablet (10 mg total) by mouth 2 (two) times daily as needed for muscle spasms. (Patient taking differently: Take 10 mg by mouth 3 (three) times daily  as needed for muscle spasms.) 20 tablet 0   estrogens, conjugated, (PREMARIN) 0.625 MG tablet Take 0.625 mg by mouth daily with breakfast.      furosemide (LASIX) 20 MG tablet Take 20 mg by mouth daily as needed for edema.      hydrocortisone (ANUSOL-HC) 2.5 % rectal cream Place 1 application rectally 2 (two) times daily. (Patient taking differently: Place 1 application rectally daily as needed for hemorrhoids or anal itching.) 30 g 1   linaclotide (LINZESS) 290 MCG CAPS capsule Take 290 mcg by mouth daily.      lubiprostone (AMITIZA) 24 MCG capsule Take 1 capsule (24 mcg total) by mouth 2 (two) times daily with a meal. 60 capsule 3   oxyCODONE-acetaminophen (PERCOCET) 10-325 MG tablet Take 1 tablet by mouth every 4 (four) hours as needed for pain.     pantoprazole (PROTONIX) 40 MG tablet Take 40 mg by mouth daily.     polyethylene glycol-electrolytes (TRILYTE) 420 g solution Take 4,000 mLs by mouth as directed. 4000 mL 0   promethazine (PHENERGAN) 25 MG tablet Take 25 mg by mouth every 6 (six) hours as needed for nausea or vomiting.     No current facility-administered medications for this visit.   Allergies:  Clindamycin hcl and Codeine   Social History: The patient  reports that she quit smoking about 14 years ago. Her smoking use included cigarettes. She has never used smokeless tobacco. She reports that she does not drink alcohol and does not use drugs.   Family History: The patient's family history includes Cancer in her father; Diabetes in an other family member; Emphysema in her mother; Hypertension in her mother and another family member.   ROS: No orthopnea or PND.  Physical Exam: VS:  BP 124/70 (BP Location: Left Arm)    Pulse 60    Ht 5\' 2"  (1.575 m)    Wt 142 lb (64.4 kg)    SpO2 98%    BMI 25.97 kg/m , BMI Body mass index is 25.97 kg/m.  Wt Readings from Last 3 Encounters:  09/13/21 142 lb (64.4 kg)  08/15/21 140 lb (63.5 kg)  07/21/21 141 lb 6.4 oz (64.1 kg)    General: Patient appears comfortable at rest. HEENT: Conjunctiva and lids normal, wearing a mask. Neck: Supple, no elevated JVP or carotid bruits, no thyromegaly. Lungs: Clear to auscultation, nonlabored breathing at rest. Cardiac: Regular rate and rhythm, no S3 or significant systolic murmur, no pericardial rub. Abdomen: Soft, bowel sounds present. Extremities: No pitting edema, distal pulses 2+. Skin: Warm and dry. Musculoskeletal: No kyphosis. Neuropsychiatric: Alert and oriented x3, affect grossly  appropriate.  ECG:  An ECG dated 05/10/2021 was personally reviewed today and demonstrated:  Sinus rhythm with low voltage.  Recent Labwork: 05/10/2021: ALT 15; AST 15; B Natriuretic Peptide 58.0; BUN 12; Creatinine, Ser 0.71; Hemoglobin 13.3; Magnesium 1.9; Platelets 306; Potassium 3.7; Sodium 136   Other Studies Reviewed Today:  Echocardiogram 08/25/2014: - Left ventricle: The cavity size was normal. Wall thickness was    increased in a pattern of mild LVH. Systolic function was normal.    The estimated ejection fraction was in the range of 60% to 65%.    Wall motion was normal; there were no regional wall motion    abnormalities. Findings consistent with left ventricular    diastolic dysfunction. There was no evidence of elevated    ventricular filling pressure by Doppler parameters.   Lexiscan Myoview 08/25/2014: IMPRESSION: 1. No reversible ischemia or  infarction.   2. Normal left ventricular wall motion.   3. Left ventricular ejection fraction 55%   4. Low-risk stress test findings*.  Chest CTA 05/10/2021: FINDINGS: Cardiovascular: No significant vascular findings. Normal heart size. No filling defects within the pulmonary arteries to suggest acute pulmonary embolism.   Mediastinum/Nodes: No axillary or supraclavicular adenopathy. No mediastinal or hilar adenopathy. No pericardial fluid. Esophagus normal.   Lungs/Pleura: No pulmonary infarction. No pneumonia. Airways normal. No pneumothorax.   Upper Abdomen: Limited view of the liver, kidneys, pancreas are unremarkable. Normal adrenal glands.   Musculoskeletal: No aggressive osseous lesion.   Review of the MIP images confirms the above findings.   IMPRESSION: 1. No acute pulmonary embolism. 2. No acute pulmonary parenchymal findings  Assessment and Plan:  Intermittent palpitations associated with shortness of breath, no frank syncope.  Baseline ECG from August 2022 showed sinus rhythm, she has no prior  documented history of cardiac arrhythmia.  Cardiac structural and ischemic testing low risk in 2015.  Plan to obtain a 7-day ZIO patch for investigation of paroxysmal arrhythmia.  Medication Adjustments/Labs and Tests Ordered: Current medicines are reviewed at length with the patient today.  Concerns regarding medicines are outlined above.   Tests Ordered: No orders of the defined types were placed in this encounter.   Medication Changes: No orders of the defined types were placed in this encounter.   Disposition:  Follow up  test results.  Signed, Satira Sark, MD, [facility] 09/13/2021 11:10 AM    [facility] [place], [place], [place] [address] Phone: [phone]; Fax: [phone]

## 2021-09-13 NOTE — Patient Instructions (Signed)
Medication Instructions:  Continue all current medications.  Labwork: none  Testing/Procedures: Your physician has recommended that you wear a 7 day event monitor. Event monitors are medical devices that record the hearts electrical activity. Doctors most often [place] these monitors to diagnose arrhythmias. Arrhythmias are problems with the speed or rhythm of the heartbeat. The monitor is a small, portable device. You can wear one while you do your normal daily activities. This is usually used to diagnose what is causing palpitations/syncope (passing out). Office will contact with results via phone or letter.     Follow-Up: Pending test results   Any Other Special Instructions Will Be Listed Below (If Applicable).   If you need a refill on your cardiac medications before your next appointment, please call your pharmacy.

## 2021-09-14 ENCOUNTER — Telehealth: Payer: Self-pay

## 2021-09-14 ENCOUNTER — Ambulatory Visit (INDEPENDENT_AMBULATORY_CARE_PROVIDER_SITE_OTHER): Payer: Medicare HMO

## 2021-09-14 ENCOUNTER — Other Ambulatory Visit: Payer: Self-pay | Admitting: Cardiology

## 2021-09-14 DIAGNOSIS — R002 Palpitations: Secondary | ICD-10-CM

## 2021-09-14 NOTE — Telephone Encounter (Signed)
PERCERT  7 day zio - mail - sm --agh

## 2021-09-21 DIAGNOSIS — R002 Palpitations: Secondary | ICD-10-CM | POA: Diagnosis not present

## 2021-09-26 DIAGNOSIS — M5030 Other cervical disc degeneration, unspecified cervical region: Secondary | ICD-10-CM | POA: Diagnosis not present

## 2021-09-26 DIAGNOSIS — M1991 Primary osteoarthritis, unspecified site: Secondary | ICD-10-CM | POA: Diagnosis not present

## 2021-09-26 DIAGNOSIS — M159 Polyosteoarthritis, unspecified: Secondary | ICD-10-CM | POA: Diagnosis not present

## 2021-09-26 DIAGNOSIS — M5136 Other intervertebral disc degeneration, lumbar region: Secondary | ICD-10-CM | POA: Diagnosis not present

## 2021-09-26 DIAGNOSIS — G894 Chronic pain syndrome: Secondary | ICD-10-CM | POA: Diagnosis not present

## 2021-09-26 DIAGNOSIS — F1120 Opioid dependence, uncomplicated: Secondary | ICD-10-CM | POA: Diagnosis not present

## 2021-09-26 DIAGNOSIS — M1711 Unilateral primary osteoarthritis, right knee: Secondary | ICD-10-CM | POA: Diagnosis not present

## 2021-10-04 DIAGNOSIS — M542 Cervicalgia: Secondary | ICD-10-CM | POA: Diagnosis not present

## 2021-10-05 DIAGNOSIS — R002 Palpitations: Secondary | ICD-10-CM | POA: Diagnosis not present

## 2021-10-06 DIAGNOSIS — E663 Overweight: Secondary | ICD-10-CM | POA: Diagnosis not present

## 2021-10-06 DIAGNOSIS — G894 Chronic pain syndrome: Secondary | ICD-10-CM | POA: Diagnosis not present

## 2021-10-06 DIAGNOSIS — F1120 Opioid dependence, uncomplicated: Secondary | ICD-10-CM | POA: Diagnosis not present

## 2021-10-06 DIAGNOSIS — I700 Atherosclerosis of aorta: Secondary | ICD-10-CM | POA: Diagnosis not present

## 2021-10-06 DIAGNOSIS — M159 Polyosteoarthritis, unspecified: Secondary | ICD-10-CM | POA: Diagnosis not present

## 2021-10-06 DIAGNOSIS — Z6826 Body mass index (BMI) 26.0-26.9, adult: Secondary | ICD-10-CM | POA: Diagnosis not present

## 2021-10-06 DIAGNOSIS — R42 Dizziness and giddiness: Secondary | ICD-10-CM | POA: Diagnosis not present

## 2021-10-06 DIAGNOSIS — F419 Anxiety disorder, unspecified: Secondary | ICD-10-CM | POA: Diagnosis not present

## 2021-10-11 ENCOUNTER — Other Ambulatory Visit (HOSPITAL_COMMUNITY): Payer: Self-pay | Admitting: Neurosurgery

## 2021-10-11 DIAGNOSIS — G8929 Other chronic pain: Secondary | ICD-10-CM

## 2021-10-11 DIAGNOSIS — M542 Cervicalgia: Secondary | ICD-10-CM

## 2021-10-25 ENCOUNTER — Ambulatory Visit (HOSPITAL_COMMUNITY)
Admission: RE | Admit: 2021-10-25 | Discharge: 2021-10-25 | Disposition: A | Discharge status: Home / Self Care | Payer: Medicare HMO | Source: Ambulatory Visit | Attending: Neurosurgery | Admitting: Neurosurgery

## 2021-10-25 ENCOUNTER — Other Ambulatory Visit: Payer: Self-pay

## 2021-10-25 DIAGNOSIS — M542 Cervicalgia: Secondary | ICD-10-CM | POA: Diagnosis not present

## 2021-10-25 DIAGNOSIS — G8929 Other chronic pain: Secondary | ICD-10-CM | POA: Diagnosis not present

## 2021-10-25 DIAGNOSIS — M4712 Other spondylosis with myelopathy, cervical region: Secondary | ICD-10-CM | POA: Diagnosis not present

## 2021-10-25 DIAGNOSIS — M5011 Cervical disc disorder with radiculopathy,  high cervical region: Secondary | ICD-10-CM | POA: Diagnosis not present

## 2021-10-26 ENCOUNTER — Other Ambulatory Visit (HOSPITAL_COMMUNITY): Payer: Self-pay | Admitting: Student

## 2021-10-26 DIAGNOSIS — R42 Dizziness and giddiness: Secondary | ICD-10-CM

## 2021-10-28 DIAGNOSIS — M5136 Other intervertebral disc degeneration, lumbar region: Secondary | ICD-10-CM | POA: Diagnosis not present

## 2021-10-28 DIAGNOSIS — F1120 Opioid dependence, uncomplicated: Secondary | ICD-10-CM | POA: Diagnosis not present

## 2021-10-28 DIAGNOSIS — M159 Polyosteoarthritis, unspecified: Secondary | ICD-10-CM | POA: Diagnosis not present

## 2021-10-28 DIAGNOSIS — G894 Chronic pain syndrome: Secondary | ICD-10-CM | POA: Diagnosis not present

## 2021-11-01 ENCOUNTER — Other Ambulatory Visit (HOSPITAL_COMMUNITY): Payer: Self-pay | Admitting: Student

## 2021-11-01 ENCOUNTER — Other Ambulatory Visit: Payer: Self-pay | Admitting: Student

## 2021-11-01 DIAGNOSIS — I6502 Occlusion and stenosis of left vertebral artery: Secondary | ICD-10-CM

## 2021-11-08 ENCOUNTER — Other Ambulatory Visit: Payer: Self-pay

## 2021-11-08 ENCOUNTER — Ambulatory Visit (HOSPITAL_COMMUNITY)
Admission: RE | Admit: 2021-11-08 | Discharge: 2021-11-08 | Disposition: A | Discharge status: Home / Self Care | Payer: Medicare HMO | Source: Ambulatory Visit | Attending: Student | Admitting: Student

## 2021-11-08 DIAGNOSIS — R519 Headache, unspecified: Secondary | ICD-10-CM | POA: Diagnosis not present

## 2021-11-08 DIAGNOSIS — M542 Cervicalgia: Secondary | ICD-10-CM | POA: Diagnosis not present

## 2021-11-08 DIAGNOSIS — R42 Dizziness and giddiness: Secondary | ICD-10-CM | POA: Diagnosis not present

## 2021-11-08 MED ORDER — GADOBUTROL 1 MMOL/ML IV SOLN
7.0000 mL | Freq: Once | INTRAVENOUS | Status: AC | PRN
  Administered 2021-11-08: 7 mL via INTRAVENOUS

## 2021-11-15 ENCOUNTER — Other Ambulatory Visit: Payer: Self-pay | Admitting: *Deleted

## 2021-11-15 DIAGNOSIS — I6529 Occlusion and stenosis of unspecified carotid artery: Secondary | ICD-10-CM

## 2021-11-16 ENCOUNTER — Other Ambulatory Visit: Payer: Self-pay

## 2021-11-16 ENCOUNTER — Ambulatory Visit: Payer: Medicare HMO | Admitting: Vascular Surgery

## 2021-11-16 ENCOUNTER — Ambulatory Visit (INDEPENDENT_AMBULATORY_CARE_PROVIDER_SITE_OTHER): Payer: Medicare HMO

## 2021-11-16 ENCOUNTER — Encounter: Payer: Self-pay | Admitting: Vascular Surgery

## 2021-11-16 VITALS — BP 130/81 | HR 68 | Ht 62.0 in | Wt 147.6 lb

## 2021-11-16 DIAGNOSIS — I6529 Occlusion and stenosis of unspecified carotid artery: Secondary | ICD-10-CM

## 2021-11-16 DIAGNOSIS — I6502 Occlusion and stenosis of left vertebral artery: Secondary | ICD-10-CM

## 2021-11-16 NOTE — Progress Notes (Signed)
? ? ?[facility] ? ?Patient name: Gina Wolfe MRN: [id number] DOB: 11-Sep-1956 Sex: female ? ?REASON FOR CONSULT: Discuss abnormal findings on recent MRI cervical spine ? ?HPI: ?Gina Wolfe is a 66 y.o. female, who is here today for discussion of abnormality on recent MRI of her cervical spine.  She has a long history of cervical disc disease having had 3 prior cervical disc surgeries.  She continues to have pain.  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  She is seen today for discussion of this.  The patient denies any prior focal neurologic deficits.  She does have vertigo. ? ?Past Medical History:  ?Diagnosis Date  ? Arthritis   ? Asthma   ? Chronic pain syndrome   ? Diverticulitis   ? GERD (gastroesophageal reflux disease)   ? IBS (irritable bowel syndrome)   ? Spinal headache   ? ? ?Family History  ?Problem Relation Age of Onset  ? Hypertension Mother   ? Emphysema Mother   ? Cancer Father   ? Hypertension Other   ? Diabetes Other   ? Colon cancer Neg Hx   ? Colon polyps Neg Hx   ? ? ?SOCIAL HISTORY: ?Social History  ? ?Socioeconomic History  ? Marital status: Married  ?  Spouse name: Not on file  ? Number of children: Not on file  ? Years of education: Not on file  ? Highest education level: Not on file  ?Occupational History  ? Not on file  ?Tobacco Use  ? Smoking status: Former  ?  Types: Cigarettes  ?  Quit date: 08/25/2007  ?  Years since quitting: 14.2  ? Smokeless tobacco: Never  ?Vaping Use  ? Vaping Use: Never used  ?Substance and Sexual Activity  ? Alcohol use: No  ?  Alcohol/week: 0.0 standard drinks  ? Drug use: No  ? Sexual activity: Not on file  ?Other Topics Concern  ? Not on file  ?Social History Narrative  ? Not on file  ? ?Social Determinants of Health  ? ?Financial Resource Strain: Not on file  ?Food Insecurity: Not on file  ?Transportation Needs: Not on file   ?Physical Activity: Not on file  ?Stress: Not on file  ?Social Connections: Not on file  ?Intimate Partner Violence: Not on file  ? ? ?Allergies  ?Allergen Reactions  ? Clindamycin Hcl Anaphylaxis  ? Codeine Nausea Only  ? ? ?Current Outpatient Medications  ?Medication Sig Dispense Refill  ? albuterol (PROVENTIL HFA;VENTOLIN HFA) 108 (90 Base) MCG/ACT inhaler Inhale 2 puffs into the lungs every 6 (six) hours as needed for wheezing or shortness of breath.     ? aspirin EC 81 MG tablet Take 81 mg by mouth daily. Swallow whole.    ? cholecalciferol (VITAMIN D3) 25 MCG (1000 UNIT) tablet Take 1,000 Units by mouth daily.    ? cyclobenzaprine (FLEXERIL) 10 MG tablet Take 1 tablet (10 mg total) by mouth 2 (two) times daily as needed for muscle spasms. (Patient taking differently: Take 10 mg by mouth 3 (three) times daily as needed for muscle spasms.) 20 tablet 0  ? estrogens, conjugated, (PREMARIN) 0.625 MG tablet Take 0.625 mg by mouth daily with breakfast.     ? furosemide (LASIX) 20 MG tablet Take 20 mg by mouth daily as needed for edema.     ? linaclotide (LINZESS) 290 MCG CAPS capsule Take 290 mcg by  mouth daily.    ? oxyCODONE-acetaminophen (PERCOCET) 10-325 MG tablet Take 1 tablet by mouth every 4 (four) hours as needed for pain.    ? pantoprazole (PROTONIX) 40 MG tablet Take 40 mg by mouth daily.    ? promethazine (PHENERGAN) 25 MG tablet Take 25 mg by mouth every 6 (six) hours as needed for nausea or vomiting.    ? ?No current facility-administered medications for this visit.  ? ? ?REVIEW OF SYSTEMS:  ?'[X]'$  denotes positive finding, '[ ]'$  denotes negative finding ?Cardiac  Comments:  ?Chest pain or chest pressure:    ?Shortness of breath upon exertion:    ?Short of breath when lying flat:    ?Irregular heart rhythm:    ?    ?Vascular    ?Pain in calf, thigh, or hip brought on by ambulation:    ?Pain in feet at night that wakes you up from your sleep:     ?Blood clot in your veins:    ?Leg swelling:     ?     ?Pulmonary    ?Oxygen at home:    ?Productive cough:     ?Wheezing:     ?    ?Neurologic    ?Sudden weakness in arms or legs:     ?Sudden numbness in arms or legs:     ?Sudden onset of difficulty speaking or slurred speech:    ?Temporary loss of vision in one eye:     ?Problems with dizziness:  x   ?    ?Gastrointestinal    ?Blood in stool:     ?Vomited blood:     ?    ?Genitourinary    ?Burning when urinating:     ?Blood in urine:    ?    ?Psychiatric    ?Major depression:     ?    ?Hematologic    ?Bleeding problems:    ?Problems with blood clotting too easily:    ?    ?Skin    ?Rashes or ulcers:    ?    ?Constitutional    ?Fever or chills:    ? ? ?PHYSICAL EXAM: ?Vitals:  ? 11/16/21 1403  ?BP: 130/81  ?Pulse: 68  ?Weight: 147 lb 9.6 oz (67 kg)  ?Height: '5\' 2"'$  (1.575 m)  ? ? ?GENERAL: The patient is a well-nourished female, in no acute distress. The vital signs are documented above. ?CARDIOVASCULAR: 2+ radial pulses bilaterally.  Carotid arteries without bruits bilaterally. ?PULMONARY: There is good air exchange  ?MUSCULOSKELETAL: There are no major deformities or cyanosis. ?NEUROLOGIC: No focal weakness or paresthesias are detected. ?SKIN: There are no ulcers or rashes noted. ?PSYCHIATRIC: The patient has a normal affect. ? ?DATA:  ?Carotid duplex in our office today revealed no evidence of stenosis in her carotid arteries bilaterally.  She has antegrade flow in her vertebral arteries bilaterally ? ?MEDICAL ISSUES: ?Had long discussion with the patient regarding this.  I explained that vertebral stenoses if present are generally asymptomatic.  Discussed that the vertebral arteries form the basilar artery before entering the cerebral circulation.  I also explained that there is typically over read from MRI and predicting level of stenosis and arterial disease.  She is scheduled to have a CT angiogram for further evaluation of this.  Explained that would be very unlikely that we would recommend any treatment even  if she does have proximal left vertebral stenosis.  Since she does have vertigo, I feel it is reasonable to proceed with  this study although I did explain to her that this would be an unlikely cause for her dizziness.  She was reassured with this discussion we will await her CT findings ? ? ?Rosetta Posner, MD FACS ?[facility] ?Office Tel [phone] ?Pager [phone] ? ?Note: Portions of this report may have been transcribed using voice recognition software.  Every effort has been made to ensure accuracy; however, inadvertent computerized transcription errors may still be present. ? ?

## 2021-11-25 DIAGNOSIS — M5136 Other intervertebral disc degeneration, lumbar region: Secondary | ICD-10-CM | POA: Diagnosis not present

## 2021-11-25 DIAGNOSIS — E538 Deficiency of other specified B group vitamins: Secondary | ICD-10-CM | POA: Diagnosis not present

## 2021-11-25 DIAGNOSIS — M159 Polyosteoarthritis, unspecified: Secondary | ICD-10-CM | POA: Diagnosis not present

## 2021-11-25 DIAGNOSIS — M5030 Other cervical disc degeneration, unspecified cervical region: Secondary | ICD-10-CM | POA: Diagnosis not present

## 2021-11-29 ENCOUNTER — Encounter: Payer: Self-pay | Admitting: Internal Medicine

## 2021-11-29 DIAGNOSIS — H5200 Hypermetropia, unspecified eye: Secondary | ICD-10-CM | POA: Diagnosis not present

## 2021-11-29 DIAGNOSIS — Z0100 Encounter for examination of eyes and vision without abnormal findings: Secondary | ICD-10-CM | POA: Diagnosis not present

## 2021-12-02 ENCOUNTER — Telehealth: Payer: Self-pay | Admitting: Cardiology

## 2021-12-02 ENCOUNTER — Telehealth: Payer: Self-pay | Admitting: *Deleted

## 2021-12-02 NOTE — Telephone Encounter (Signed)
New Message: ? ? ? ? ?Patient wants to know if it is possible that she have a Video Visit for her appointment on 12-07-21? ?

## 2021-12-02 NOTE — Telephone Encounter (Signed)
The patient verbally consented for a telehealth phone visit with [facility] and understands that his/her insurance company will be billed for the encounter.  

## 2021-12-02 NOTE — Telephone Encounter (Signed)
Appointment changed & patient made aware.  ?

## 2021-12-07 ENCOUNTER — Other Ambulatory Visit: Payer: Self-pay

## 2021-12-07 ENCOUNTER — Ambulatory Visit (INDEPENDENT_AMBULATORY_CARE_PROVIDER_SITE_OTHER): Payer: Medicare HMO | Admitting: Cardiology

## 2021-12-07 ENCOUNTER — Encounter: Payer: Self-pay | Admitting: Cardiology

## 2021-12-07 ENCOUNTER — Ambulatory Visit (HOSPITAL_COMMUNITY)
Admission: RE | Admit: 2021-12-07 | Discharge: 2021-12-07 | Disposition: A | Discharge status: Home / Self Care | Payer: Medicare HMO | Source: Ambulatory Visit | Attending: Student | Admitting: Student

## 2021-12-07 VITALS — BP 117/79 | Ht 63.0 in | Wt 150.0 lb

## 2021-12-07 DIAGNOSIS — R519 Headache, unspecified: Secondary | ICD-10-CM | POA: Diagnosis not present

## 2021-12-07 DIAGNOSIS — R002 Palpitations: Secondary | ICD-10-CM | POA: Diagnosis not present

## 2021-12-07 DIAGNOSIS — I720 Aneurysm of carotid artery: Secondary | ICD-10-CM | POA: Diagnosis not present

## 2021-12-07 DIAGNOSIS — I6502 Occlusion and stenosis of left vertebral artery: Secondary | ICD-10-CM | POA: Diagnosis not present

## 2021-12-07 DIAGNOSIS — I471 Supraventricular tachycardia: Secondary | ICD-10-CM

## 2021-12-07 LAB — POCT I-STAT CREATININE: Creatinine, Ser: 0.7 mg/dL (ref 0.44–1.00)

## 2021-12-07 MED ORDER — IOHEXOL 350 MG/ML SOLN
100.0000 mL | Freq: Once | INTRAVENOUS | Status: AC | PRN
  Administered 2021-12-07: 75 mL via INTRAVENOUS

## 2021-12-07 MED ORDER — METOPROLOL SUCCINATE ER 25 MG PO TB24
12.5000 mg | ORAL_TABLET | Freq: Every day | ORAL | 3 refills | Status: DC
Start: 2021-12-07 — End: 2022-01-18

## 2021-12-07 NOTE — Progress Notes (Signed)
? ?Virtual Visit via Telephone Note  ? ?All issues noted in this document were discussed and addressed.  No physical exam could be performed with this format.  Please refer to the patient's chart for her  consent to telehealth for [name].  ? ? ?Date:  12/07/2021  ? ?ID:  Gina Wolfe, DOB [birth year], MRN [id number] ?The patient was identified using 2 identifiers. ? ?Patient Location: Home ?Provider Location: Office/Clinic ? ? ?PCP:  Redmond School, MD ?  ?[facility] Providers ?Cardiologist:  Rozann Lesches, MD { ? ?Evaluation Performed:  Follow-Up Visit ? ?Chief Complaint:  Cardiac follow-up ? ?History of Present Illness:   ? ?Gina Wolfe is a 66 y.o. female seen in consultation back in January.  We communicated by phone today.  She states that she was placed on low-dose propranolol by PCP, still feels episodes of palpitations and shortness of breath however.  We discussed considering a switch to different, longer acting beta-blocker.  Her blood pressure does tend to run low normal range. ? ?Cardiac monitor from January showed sinus rhythm with rare PACs and PVCs, also brief episodes of PSVT, the longest of which was only 12 beats. ? ?Past Medical History:  ?Diagnosis Date  ? Arthritis   ? Asthma   ? Chronic pain syndrome   ? Diverticulitis   ? GERD (gastroesophageal reflux disease)   ? IBS (irritable bowel syndrome)   ? Spinal headache   ? ?Past Surgical History:  ?Procedure Laterality Date  ? ABDOMINAL HYSTERECTOMY    ? BACK SURGERY    ? x3 previous lumbar, x3 cervical surgeries  ? CHOLECYSTECTOMY    ? COLONOSCOPY  05/20/10  ? MCN:[id number] hemorrhoids otherwise normal/EGD:distal esophageal erosions otherwise normal  ? COLONOSCOPY WITH PROPOFOL N/A 08/15/2021  ? Procedure: COLONOSCOPY WITH PROPOFOL;  Surgeon: Gina Dolin, MD;  [facility];  Service: Endoscopy;  Laterality: N/A;  7:30am  ? CYST EXCISION Right   ? Eye cyst  ? EYE SURGERY Right   ? removed a cyst  ? LUMBAR  LAMINECTOMY/DECOMPRESSION MICRODISCECTOMY N/A 07/13/2016  ? Procedure: DISCECTOMY Lumbar four-five;  Surgeon: Gina Pies, MD;  Location: [place];  Service: Neurosurgery;  Laterality: N/A;  ? MENISCUS REPAIR  2007  ? R knee   ? POSTERIOR CERVICAL FUSION/FORAMINOTOMY N/A 04/10/2013  ? Procedure: POSTERIOR CERVICAL FUSION/FORAMINOTOMY LEVEL 2;  Surgeon: Gina Ship, MD;  Location: [place];  Service: Orthopedics;  Laterality: N/A;  Posterior cervical decompression fusion, cervical 5-6, cervical 6-7 with instrumentation and allograft.  ? TOTAL KNEE ARTHROPLASTY Right 01/03/2021  ? Procedure: TOTAL KNEE ARTHROPLASTY;  Surgeon: Gina Arabian, MD;  [facility];  Service: Orthopedics;  Laterality: Right;  70mn  ? TUBAL LIGATION    ?  ? ?Current Meds  ?Medication Sig  ? albuterol (PROVENTIL HFA;VENTOLIN HFA) 108 (90 Base) MCG/ACT inhaler Inhale 2 puffs into the lungs every 6 (six) hours as needed for wheezing or shortness of breath.   ? aspirin EC 81 MG tablet Take 81 mg by mouth daily. Swallow whole.  ? cholecalciferol (VITAMIN D3) 25 MCG (1000 UNIT) tablet Take 1,000 Units by mouth daily.  ? cyclobenzaprine (FLEXERIL) 10 MG tablet Take 1 tablet (10 mg total) by mouth 2 (two) times daily as needed for muscle spasms. (Patient taking differently: Take 10 mg by mouth 3 (three) times daily as needed for muscle spasms.)  ? estrogens, conjugated, (PREMARIN) 0.625 MG tablet Take 0.625 mg by mouth daily with breakfast.   ? furosemide (  LASIX) 20 MG tablet Take 20 mg by mouth daily as needed for edema.   ? gabapentin (NEURONTIN) 100 MG capsule Take 100 mg by mouth 3 (three) times daily.  ? linaclotide (LINZESS) 290 MCG CAPS capsule Take 290 mcg by mouth daily.  ? metoprolol succinate (TOPROL XL) 25 MG 24 hr tablet Take 0.5 tablets (12.5 mg total) by mouth daily.  ? oxyCODONE-acetaminophen (PERCOCET) 10-325 MG tablet Take 1 tablet by mouth every 4 (four) hours as needed for pain.  ? pantoprazole (PROTONIX) 40 MG tablet  Take 40 mg by mouth daily.  ? promethazine (PHENERGAN) 25 MG tablet Take 25 mg by mouth every 6 (six) hours as needed for nausea or vomiting.  ? [DISCONTINUED] propranolol (INDERAL) 10 MG tablet Take 10 mg by mouth 2 (two) times daily.  ?  ? ?Allergies:   Clindamycin hcl and Codeine  ? ?ROS:   ?Please see the history of present illness. ?All other systems reviewed and are negative. ? ? ?Prior CV studies:   ?The following studies were reviewed today: ? ?Cardiac monitor January 2023: ?ZIO XT reviewed.  7 days, 14 hours analyzed.  Predominant rhythm is sinus with heart rate ranging from 52 bpm up to 131 bpm and average heart rate 67 bpm. ?  ?There were rare PACs including atrial couplets and triplets representing less than 1% total beats.  There were also rare PVCs representing less than 1% total beats. ?  ?Brief episodes of PSVT were noted, the longest of which lasted only 12 beats with average heart rate at 110 bpm.  There were no sustained arrhythmias or pauses. ?  ?Patient triggered events did not correlate with any specific arrhythmia. ? ?Carotid Dopplers 11/16/2021: ?Summary:  ?Right Carotid: Velocities in the right ICA are consistent with a 1-39%  ?stenosis.  ? ?Left Carotid: Velocities in the left ICA are consistent with a 1-39%  ?stenosis.  ? ?Vertebrals:  Bilateral vertebral arteries demonstrate antegrade flow.  ?Subclavians: Normal flow hemodynamics were seen in bilateral subclavian  ?             arteries.  ? ?Labs/Other Tests and Data Reviewed:   ? ?EKG:  An ECG dated 05/10/2021 was personally reviewed today and demonstrated:  Sinus rhythm with low voltage. ? ?Recent Labs: ?05/10/2021: ALT 15; B Natriuretic Peptide 58.0; BUN 12; Hemoglobin 13.3; Magnesium 1.9; Platelets 306; Potassium 3.7; Sodium 136 ?12/07/2021: Creatinine, Ser 0.70  ? ? ?Wt Readings from Last 3 Encounters:  ?12/07/21 150 lb (68 kg)  ?11/16/21 147 lb 9.6 oz (67 kg)  ?09/13/21 142 lb (64.4 kg)  ?  ?    ?Objective:   ? ?Vital Signs:  BP 117/79    Ht '5\' 3"'$  (1.6 m)   Wt 150 lb (68 kg)   BMI 26.57 kg/m?   ? ?ASSESSMENT & PLAN:   ? ?Palpitations with cardiac monitor showing rare PACs and PVCs as well as brief episodes of PSVT.  Plan to switch from propranolol to Toprol-XL beginning at 12.5 mg daily and uptitrating as tolerated.  We will schedule office follow-up for review of symptoms. ?   ? ?Time:   ?Today, I have spent 6 minutes with the patient with telehealth technology discussing the above problems.   ? ? ?Medication Adjustments/Labs and Tests Ordered: ?Current medicines are reviewed at length with the patient today.  Concerns regarding medicines are outlined above.  ? ?Tests Ordered: ?No orders of the defined types were placed in this encounter. ? ? ?Medication Changes: ?Meds  ordered this encounter  ?Medications  ? metoprolol succinate (TOPROL XL) 25 MG 24 hr tablet  ?  Sig: Take 0.5 tablets (12.5 mg total) by mouth daily.  ?  Dispense:  15 tablet  ?  Refill:  3  ?  12/07/2021 NEW-stop propranolol  ? ? ?Follow Up:  In Person  6 weeks ? ?Signed, ?Rozann Lesches, MD  ?12/07/2021 4:29 PM    ?[place]  ?

## 2021-12-07 NOTE — Patient Instructions (Addendum)
Medication Instructions:  ?Your physician has recommended you make the following change in your medication:  ?Stop propranolol ?Start metoprolol succinate 12.5 mg daily ?Continue other medications the same ? ?Labwork: ?none ? ?Testing/Procedures: ?none ? ?Follow-Up: ?Your physician recommends that you schedule a follow-up appointment in: 6 weeks at the [place] office ? ?Any Other Special Instructions Will Be Listed Below (If Applicable). ? ?If you need a refill on your cardiac medications before your next appointment, please call your pharmacy. ?

## 2021-12-30 DIAGNOSIS — M5136 Other intervertebral disc degeneration, lumbar region: Secondary | ICD-10-CM | POA: Diagnosis not present

## 2021-12-30 DIAGNOSIS — G894 Chronic pain syndrome: Secondary | ICD-10-CM | POA: Diagnosis not present

## 2021-12-30 DIAGNOSIS — M159 Polyosteoarthritis, unspecified: Secondary | ICD-10-CM | POA: Diagnosis not present

## 2021-12-30 DIAGNOSIS — M542 Cervicalgia: Secondary | ICD-10-CM | POA: Diagnosis not present

## 2021-12-30 DIAGNOSIS — E663 Overweight: Secondary | ICD-10-CM | POA: Diagnosis not present

## 2021-12-30 DIAGNOSIS — Z6826 Body mass index (BMI) 26.0-26.9, adult: Secondary | ICD-10-CM | POA: Diagnosis not present

## 2022-01-08 DIAGNOSIS — K219 Gastro-esophageal reflux disease without esophagitis: Secondary | ICD-10-CM | POA: Diagnosis not present

## 2022-01-08 DIAGNOSIS — G894 Chronic pain syndrome: Secondary | ICD-10-CM | POA: Diagnosis not present

## 2022-01-17 DIAGNOSIS — I671 Cerebral aneurysm, nonruptured: Secondary | ICD-10-CM | POA: Diagnosis not present

## 2022-01-17 DIAGNOSIS — Z6826 Body mass index (BMI) 26.0-26.9, adult: Secondary | ICD-10-CM | POA: Diagnosis not present

## 2022-01-17 DIAGNOSIS — M542 Cervicalgia: Secondary | ICD-10-CM | POA: Diagnosis not present

## 2022-01-17 DIAGNOSIS — I6502 Occlusion and stenosis of left vertebral artery: Secondary | ICD-10-CM | POA: Diagnosis not present

## 2022-01-18 ENCOUNTER — Ambulatory Visit: Payer: Medicare HMO | Admitting: Student

## 2022-01-18 ENCOUNTER — Encounter: Payer: Self-pay | Admitting: Student

## 2022-01-18 VITALS — BP 112/74 | HR 68 | Ht 63.0 in | Wt 148.0 lb

## 2022-01-18 DIAGNOSIS — R0609 Other forms of dyspnea: Secondary | ICD-10-CM | POA: Diagnosis not present

## 2022-01-18 DIAGNOSIS — R0789 Other chest pain: Secondary | ICD-10-CM

## 2022-01-18 DIAGNOSIS — I471 Supraventricular tachycardia: Secondary | ICD-10-CM

## 2022-01-18 DIAGNOSIS — R002 Palpitations: Secondary | ICD-10-CM | POA: Diagnosis not present

## 2022-01-18 DIAGNOSIS — K219 Gastro-esophageal reflux disease without esophagitis: Secondary | ICD-10-CM

## 2022-01-18 MED ORDER — METOPROLOL SUCCINATE ER 25 MG PO TB24: 12.5000 mg | ORAL_TABLET | Freq: Every day | ORAL | 1 refills | Status: DC

## 2022-01-18 NOTE — Patient Instructions (Addendum)
Medication Instructions:  ?Your physician has recommended you make the following change in your medication:  ?Stop propranolol ?Start metoprolol succinate 12.5 mg daily ?Continue other medications the same ? ?Labwork: ?none ? ?Testing/Procedures: ?Coronary CTA ? ?Follow-Up: ?Your physician recommends that you schedule a follow-up appointment in: 6 months with Domenic Polite ? ?Any Other Special Instructions Will Be Listed Below (If Applicable). ? ?If you need a refill on your cardiac medications before your next appointment, please call your pharmacy. ? ? ? ?Your cardiac CT will be scheduled at one of the below locations:  ? ?[facility] ?[address] ?[place], [place] [address] ?[phone] ? ?If scheduled at [facility], please arrive at the [facility] and Children's Entrance (Entrance C2) of [facility] 30 minutes prior to test start time. ?You can use the FREE valet parking offered at entrance C (encouraged to control the heart rate for the test)  ?Proceed to the [facility] Radiology Department (first floor) to check-in and test prep. ? ?All radiology patients and guests should use entrance C2 at [facility], accessed from [facility], even though the hospital's physical address listed is [address] ? ? ? ? ?Please follow these instructions carefully (unless otherwise directed): ? ? ?On the Night Before the Test: ?Be sure to Drink plenty of water. ?Do not consume any caffeinated/decaffeinated beverages or chocolate 12 hours prior to your test. ?Do not take any antihistamines 12 hours prior to your test. (Promethazine) ? ?On the Day of the Test: ?Drink plenty of water until 1 hour prior to the test. ?Do not eat any food 4 hours prior to the test. ?You may take your regular medications prior to the test.  ?Take metoprolol 12.5 mg two hours prior to test. ?HOLD Furosemide/Hydrochlorothiazide morning of the test. ?FEMALES- please wear underwire-free bra if  available, avoid dresses & tight clothing ?     ?After the Test: ?Drink plenty of water. ?After receiving IV contrast, you may experience a mild flushed feeling. This is normal. ?On occasion, you may experience a mild rash up to 24 hours after the test. This is not dangerous. If this occurs, you can take Benadryl 25 mg and increase your fluid intake. ?If you experience trouble breathing, this can be serious. If it is severe call 911 IMMEDIATELY. If it is mild, please call our office. ? ?We will call to schedule your test 2-4 weeks out understanding that some insurance companies will need an authorization prior to the service being performed.  ? ?For non-scheduling related questions, please contact the cardiac imaging nurse navigator should you have any questions/concerns: ?Marchia Bond, Cardiac Imaging Nurse Navigator ?Gordy Clement, Cardiac Imaging Nurse Navigator ?[facility] ?Direct Office Dial: [phone]  ? ?For scheduling needs, including cancellations and rescheduling, please call [place], [phone]. ?

## 2022-01-18 NOTE — Progress Notes (Signed)
? ?Cardiology Office Note   ? ?Date:  01/18/2022  ? ?ID:  Gina Wolfe, DOB October 18, 1955, MRN [id number] ? ?PCP:  Redmond School, MD  ?Cardiologist: Rozann Lesches, MD   ? ?Chief Complaint  ?Patient presents with  ? Follow-up  ?  6 week visit  ? ? ?History of Present Illness:   ? ?Gina Wolfe is a 66 y.o. female with past medical history of palpitations (prior monitor showing PAC's, PVC's and brief SVT), IBS, asthma and arthritis who presents to the office today for 6-week follow-up. ? ?She most recently had a telehealth visit with Dr. Domenic Polite in 11/2021 to review her recent monitor results which showed PAC's and PVC's with brief episodes of PSVT. She had been on Propanolol, therefore this was discontinued and switched to Toprol-XL 12.5 mg daily. Follow-up was arranged to see if she would require further titration of medical therapy. ? ?In talking with the patient today, she reports still having frequent palpitations which are typically most notable during the day. She describes this as a "fluttering" and symptoms typically last for a few seconds to minutes and spontaneously resolve. She has reduced her caffeine intake and does not consume alcohol. She has remained on Propanolol as opposed to starting Toprol-XL as she did not pick this up from the pharmacy. She is concerned about medication changes given her soft blood pressure readings at home. ? ?She does report having dyspnea on exertion which has been more progressive over the past 6 months. She reports intermittent chest pain but this typically occurs at rest and she describes it as a "knot" near her left pectoral region. This is not associated with exertion or positional changes. ? ? ?Past Medical History:  ?Diagnosis Date  ? Arthritis   ? Asthma   ? Chronic pain syndrome   ? Diverticulitis   ? GERD (gastroesophageal reflux disease)   ? IBS (irritable bowel syndrome)   ? Spinal headache   ? ? ?Past Surgical History:  ?Procedure Laterality Date  ? ABDOMINAL  HYSTERECTOMY    ? BACK SURGERY    ? x3 previous lumbar, x3 cervical surgeries  ? CHOLECYSTECTOMY    ? COLONOSCOPY  05/20/10  ? LKT:GYBWLSLH hemorrhoids otherwise normal/EGD:distal esophageal erosions otherwise normal  ? COLONOSCOPY WITH PROPOFOL N/A 08/15/2021  ? Procedure: COLONOSCOPY WITH PROPOFOL;  Surgeon: Daneil Dolin, MD;  [facility];  Service: Endoscopy;  Laterality: N/A;  7:30am  ? CYST EXCISION Right   ? Eye cyst  ? EYE SURGERY Right   ? removed a cyst  ? LUMBAR LAMINECTOMY/DECOMPRESSION MICRODISCECTOMY N/A 07/13/2016  ? Procedure: DISCECTOMY Lumbar four-five;  Surgeon: Newman Pies, MD;  Location: [place];  Service: Neurosurgery;  Laterality: N/A;  ? MENISCUS REPAIR  2007  ? R knee   ? POSTERIOR CERVICAL FUSION/FORAMINOTOMY N/A 04/10/2013  ? Procedure: POSTERIOR CERVICAL FUSION/FORAMINOTOMY LEVEL 2;  Surgeon: Sinclair Ship, MD;  Location: [place];  Service: Orthopedics;  Laterality: N/A;  Posterior cervical decompression fusion, cervical 5-6, cervical 6-7 with instrumentation and allograft.  ? TOTAL KNEE ARTHROPLASTY Right 01/03/2021  ? Procedure: TOTAL KNEE ARTHROPLASTY;  Surgeon: Gaynelle Arabian, MD;  [facility]: [facility];  Service: Orthopedics;  Laterality: Right;  58mn  ? TUBAL LIGATION    ? ? ?Current Medications: ?Outpatient Medications Prior to Visit  ?Medication Sig Dispense Refill  ? albuterol (PROVENTIL HFA;VENTOLIN HFA) 108 (90 Base) MCG/ACT inhaler Inhale 2 puffs into the lungs every 6 (six) hours as needed for wheezing or shortness of  breath.     ? aspirin EC 81 MG tablet Take 81 mg by mouth daily. Swallow whole.    ? cholecalciferol (VITAMIN D3) 25 MCG (1000 UNIT) tablet Take 1,000 Units by mouth daily.    ? cyclobenzaprine (FLEXERIL) 10 MG tablet Take 1 tablet (10 mg total) by mouth 2 (two) times daily as needed for muscle spasms. (Patient taking differently: Take 10 mg by mouth 3 (three) times daily as needed for muscle spasms.) 20 tablet 0  ? estrogens, conjugated, (PREMARIN)  0.625 MG tablet Take 0.625 mg by mouth daily with breakfast.     ? furosemide (LASIX) 20 MG tablet Take 20 mg by mouth daily as needed for edema.     ? gabapentin (NEURONTIN) 100 MG capsule Take 100 mg by mouth 3 (three) times daily.    ? linaclotide (LINZESS) 290 MCG CAPS capsule Take 290 mcg by mouth daily.    ? oxyCODONE-acetaminophen (PERCOCET) 10-325 MG tablet Take 1 tablet by mouth every 4 (four) hours as needed for pain.    ? pantoprazole (PROTONIX) 40 MG tablet Take 40 mg by mouth daily.    ? promethazine (PHENERGAN) 25 MG tablet Take 25 mg by mouth every 6 (six) hours as needed for nausea or vomiting.    ? metoprolol succinate (TOPROL XL) 25 MG 24 hr tablet Take 0.5 tablets (12.5 mg total) by mouth daily. 15 tablet 3  ? propranolol (INDERAL) 10 MG tablet Take 10 mg by mouth 2 (two) times daily. Take 1 tablet by mouth 2 times daily.    ? ?No facility-administered medications prior to visit.  ?  ? ?Allergies:   Clindamycin hcl and Codeine  ? ?Social History  ? ?Socioeconomic History  ? Marital status: Married  ?  Spouse name: Not on file  ? Number of children: Not on file  ? Years of education: Not on file  ? Highest education level: Not on file  ?Occupational History  ? Not on file  ?Tobacco Use  ? Smoking status: Former  ?  Types: Cigarettes  ?  Quit date: 08/25/2007  ?  Years since quitting: 14.4  ? Smokeless tobacco: Never  ?Vaping Use  ? Vaping Use: Never used  ?Substance and Sexual Activity  ? Alcohol use: No  ?  Alcohol/week: 0.0 standard drinks  ? Drug use: No  ? Sexual activity: Not on file  ?Other Topics Concern  ? Not on file  ?Social History Narrative  ? Not on file  ? ?Social Determinants of Health  ? ?Financial Resource Strain: Not on file  ?Food Insecurity: Not on file  ?Transportation Needs: Not on file  ?Physical Activity: Not on file  ?Stress: Not on file  ?Social Connections: Not on file  ?  ? ?Family History:  The patient's family history includes Cancer in her father; Diabetes in an other  family member; Emphysema in her mother; Hypertension in her mother and another family member.  ? ?Review of Systems:   ? ?Please see the history of present illness.    ? ?All other systems reviewed and are otherwise negative except as noted above. ? ? ?Physical Exam:   ? ?VS:  BP 112/74   Pulse 68   Ht '5\' 3"'$  (1.6 m)   Wt 148 lb (67.1 kg)   SpO2 98%   BMI 26.22 kg/m?    ?General: Well developed, well nourished,female appearing in no acute distress. ?Head: Normocephalic, atraumatic. ?Neck: No carotid bruits. JVD not elevated.  ?Lungs: Respirations regular  and unlabored, without wheezes or rales.  ?Heart: Regular rate and rhythm. No S3 or S4.  No murmur, no rubs, or gallops appreciated. ?Abdomen: Appears non-distended. No obvious abdominal masses. ?Msk:  Strength and tone appear normal for age. No obvious joint deformities or effusions. ?Extremities: No clubbing or cyanosis. No pitting edema.  Distal pedal pulses are 2+ bilaterally. ?Neuro: Alert and oriented X 3. Moves all extremities spontaneously. No focal deficits noted. ?Psych:  Responds to questions appropriately with a normal affect. ?Skin: No rashes or lesions noted ? ?Wt Readings from Last 3 Encounters:  ?01/18/22 148 lb (67.1 kg)  ?12/07/21 150 lb (68 kg)  ?11/16/21 147 lb 9.6 oz (67 kg)  ?  ? ?Studies/Labs Reviewed:  ? ?EKG:  EKG is not ordered today.  ? ?Recent Labs: ?05/10/2021: ALT 15; B Natriuretic Peptide 58.0; BUN 12; Hemoglobin 13.3; Magnesium 1.9; Platelets 306; Potassium 3.7; Sodium 136 ?12/07/2021: Creatinine, Ser 0.70  ? ?Lipid Panel ?No results found for: CHOL, TRIG, HDL, CHOLHDL, VLDL, LDLCALC, LDLDIRECT ? ?Additional studies/ records that were reviewed today include:  ? ?Echocardiogram: 08/2014 ?Study Conclusions  ? ?- Left ventricle: The cavity size was normal. Wall thickness was  ?  increased in a pattern of mild LVH. Systolic function was normal.  ?  The estimated ejection fraction was in the range of 60% to 65%.  ?  Wall motion was  normal; there were no regional wall motion  ?  abnormalities. Findings consistent with left ventricular  ?  diastolic dysfunction. There was no evidence of elevated  ?  ventricular filling pressure by Doppler pa

## 2022-01-25 DIAGNOSIS — I671 Cerebral aneurysm, nonruptured: Secondary | ICD-10-CM | POA: Diagnosis not present

## 2022-01-25 DIAGNOSIS — Z6827 Body mass index (BMI) 27.0-27.9, adult: Secondary | ICD-10-CM | POA: Diagnosis not present

## 2022-01-27 DIAGNOSIS — G894 Chronic pain syndrome: Secondary | ICD-10-CM | POA: Diagnosis not present

## 2022-02-03 ENCOUNTER — Ambulatory Visit (HOSPITAL_COMMUNITY): Payer: Medicare HMO

## 2022-02-08 ENCOUNTER — Telehealth (HOSPITAL_COMMUNITY): Payer: Self-pay | Admitting: Emergency Medicine

## 2022-02-08 NOTE — Telephone Encounter (Signed)
Reaching out to patient to offer assistance regarding upcoming cardiac imaging study; pt verbalizes understanding of appt date/time, parking situation and where to check in, pre-test NPO status and medications ordered, and verified current allergies; name and call back number provided for further questions should they arise Gina Bond RN Navigator Cardiac Imaging [facility] [phone] office [phone] cell  Denies iv issues  Arrival 730 Taking full dose metoprolol succinate

## 2022-02-09 ENCOUNTER — Encounter (HOSPITAL_COMMUNITY): Payer: Self-pay

## 2022-02-09 ENCOUNTER — Ambulatory Visit (HOSPITAL_COMMUNITY)
Admission: RE | Admit: 2022-02-09 | Discharge: 2022-02-09 | Disposition: A | Discharge status: Home / Self Care | Payer: Medicare HMO | Source: Ambulatory Visit | Attending: Student | Admitting: Student

## 2022-02-09 DIAGNOSIS — R0609 Other forms of dyspnea: Secondary | ICD-10-CM | POA: Diagnosis not present

## 2022-02-09 DIAGNOSIS — R072 Precordial pain: Secondary | ICD-10-CM | POA: Diagnosis not present

## 2022-02-09 DIAGNOSIS — R0789 Other chest pain: Secondary | ICD-10-CM | POA: Insufficient documentation

## 2022-02-09 MED ORDER — NITROGLYCERIN 0.4 MG SL SUBL
0.8000 mg | SUBLINGUAL_TABLET | Freq: Once | SUBLINGUAL | Status: DC
Start: 2022-02-09 — End: 2022-02-10

## 2022-02-09 MED ORDER — IOHEXOL 350 MG/ML SOLN
100.0000 mL | Freq: Once | INTRAVENOUS | Status: AC | PRN
  Administered 2022-02-09: 100 mL via INTRAVENOUS

## 2022-02-09 MED ORDER — NITROGLYCERIN 0.4 MG SL SUBL
SUBLINGUAL_TABLET | SUBLINGUAL | Status: AC
  Filled 2022-02-09: qty 2

## 2022-02-10 DIAGNOSIS — Z96651 Presence of right artificial knee joint: Secondary | ICD-10-CM | POA: Diagnosis not present

## 2022-02-13 ENCOUNTER — Encounter (HOSPITAL_COMMUNITY): Payer: Self-pay | Admitting: Physical Therapy

## 2022-02-13 ENCOUNTER — Ambulatory Visit (HOSPITAL_COMMUNITY): Payer: Medicare HMO | Attending: Student | Admitting: Physical Therapy

## 2022-02-13 DIAGNOSIS — M542 Cervicalgia: Secondary | ICD-10-CM | POA: Diagnosis not present

## 2022-02-13 DIAGNOSIS — Z6827 Body mass index (BMI) 27.0-27.9, adult: Secondary | ICD-10-CM | POA: Diagnosis not present

## 2022-02-13 DIAGNOSIS — M25512 Pain in left shoulder: Secondary | ICD-10-CM | POA: Insufficient documentation

## 2022-02-13 DIAGNOSIS — I671 Cerebral aneurysm, nonruptured: Secondary | ICD-10-CM | POA: Diagnosis not present

## 2022-02-13 NOTE — Patient Instructions (Signed)

## 2022-02-13 NOTE — Therapy (Signed)
OUTPATIENT PHYSICAL THERAPY CERVICAL EVALUATION   Patient Name: Gina Wolfe MRN: [id number] DOB:Jun 06, 1956, 66 y.o., female Today's Date: 02/13/2022   PT End of Session - 02/13/22 0927     Visit Number 1    Number of Visits 12    Date for PT Re-Evaluation 03/27/22    Authorization Type [employer]    Authorization Time Period submited for 12 visits (check auth)    Authorization - Visit Number 1    Authorization - Number of Visits 1    Progress Note Due on Visit 10    PT Start Time [id number]    PT Stop Time 0940    PT Time Calculation (min) 45 min    Activity Tolerance Patient tolerated treatment well    Behavior During Therapy WFL for tasks assessed/performed             Past Medical History:  Diagnosis Date   Arthritis    Asthma    Chronic pain syndrome    Diverticulitis    GERD (gastroesophageal reflux disease)    IBS (irritable bowel syndrome)    Spinal headache    Past Surgical History:  Procedure Laterality Date   ABDOMINAL HYSTERECTOMY     BACK SURGERY     x3 previous lumbar, x3 cervical surgeries   CHOLECYSTECTOMY     COLONOSCOPY  05/20/10   VPX:TGGYIRSW hemorrhoids otherwise normal/EGD:distal esophageal erosions otherwise normal   COLONOSCOPY WITH PROPOFOL N/A 08/15/2021   Procedure: COLONOSCOPY WITH PROPOFOL;  Surgeon: Daneil Dolin, MD;  [facility];  Service: Endoscopy;  Laterality: N/A;  7:30am   CYST EXCISION Right    Eye cyst   EYE SURGERY Right    removed a cyst   LUMBAR LAMINECTOMY/DECOMPRESSION MICRODISCECTOMY N/A 07/13/2016   Procedure: DISCECTOMY Lumbar four-five;  Surgeon: Newman Pies, MD;  Location: [place];  Service: Neurosurgery;  Laterality: N/A;   MENISCUS REPAIR  2007   R knee    POSTERIOR CERVICAL FUSION/FORAMINOTOMY N/A 04/10/2013   Procedure: POSTERIOR CERVICAL FUSION/FORAMINOTOMY LEVEL 2;  Surgeon: Sinclair Ship, MD;  Location: [place];  Service: Orthopedics;  Laterality: N/A;  Posterior cervical decompression  fusion, cervical 5-6, cervical 6-7 with instrumentation and allograft.   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 This has been ongoing for several years. She has had therapy before. She has had 3 neck surgeries (surgical fusions) (see impression). She is currently managing symptoms with pain medication.   PERTINENT HISTORY:  3 x neck surgery ( ACDF, fusion), RT TKA, several lumbar surgeries, cerebrovascular aneurysm   PAIN:  Are you having pain? Yes: NPRS scale: 4/10 Pain location: neck  Pain description: aching, throbbing  Aggravating factors: Movement  Relieving factors: meds, rest, heat   PRECAUTIONS: None  WEIGHT BEARING RESTRICTIONS No  FALLS:  Has patient fallen in last 6 months? No  LIVING ENVIRONMENT: Lives with: lives with their spouse Lives in: Mobile home  OCCUPATION: Retired    PLOF: Independent  PATIENT GOALS Be pain free   OBJECTIVE:   DIAGNOSTIC FINDINGS:  IMPRESSION: No acute finding. Previous ACDF at C4-5. Previous anterior and posterior fusion from C5-C7. Satisfactory appearance in the operative region allowing for hardware artifact.   Mild bulging of the discs at C2-3 and C3-4. No compressive canal stenosis. Mild foraminal narrowing on the left at C3-4.IMPRESSION: Cervical spondylosis and postoperative changes, as outlined.   At C3-C4, there is progressive multifactorial minimal relative spinal canal narrowing (without spinal cord mass effect), and bilateral neural foraminal narrowing (mild/moderate right, moderate/severe left).   Findings have otherwise not significantly changed from the cervical spine MRI of 01/18/2017. No significant spinal canal stenosis at the remaining levels. As before, vertebral osteophytosis results in mild/moderate right neural foraminal narrowing at C6-C7.   Straightening of the expected cervical lordosis.   Trace C3-C4 grade 1 anterolisthesis.  PATIENT SURVEYS:  FOTO 42% function    COGNITION: Overall cognitive status: Within functional limits for tasks assessed   POSTURE: rounded shoulders and forward head  PALPATION: Mod TTP about bilateral upper trapezius and levator (LT>RT)    CERVICAL ROM:   Active ROM A/PROM (deg) Eval 02/13/22  Flexion 29  Extension 18  Right lateral flexion   Left lateral flexion   Right rotation 32  Left rotation 31   (Blank rows = not tested)  UPPER EXTREMITY ROM: WNL    UPPER EXTREMITY MMT:  MMT Right Eval 02/13/22 Left Eval 02/13/22  Shoulder flexion 4+ 4+  Shoulder extension    Shoulder abduction 4+ 4  Shoulder adduction    Shoulder extension    Shoulder internal rotation 5 4+  Shoulder external rotation 5 4+  Middle trapezius    Lower trapezius    Elbow flexion    Elbow extension    Wrist flexion    Wrist extension    Wrist ulnar deviation     Wrist radial deviation    Wrist pronation    Wrist supination    Grip strength     (Blank rows = not tested)   TODAY'S TREATMENT:  02/13/22 Chin tuck  Scap retraction Shoulder adduction stretch Cervical AROM (rotation and flexion/ extension)   Trigger Point Dry-Needling  Treatment instructions: Expect mild to moderate muscle soreness. S/S of pneumothorax if dry needled over a lung field, and to seek immediate medical attention should they occur. Patient verbalized understanding of these instructions and education.  Patient Consent Given: Yes Education handout provided: Yes Muscles treated: Bilateral upper trap  Electrical stimulation performed: No Parameters: N/A Treatment response/outcome: good tolerance, several twitch response noted   Manual STM to bilateral upper trap pre and post dry needling for trigger point identification and surface area preparation  PATIENT EDUCATION:  Education details: on evaluation findings, POC, HEP and dry needling application/ precaution/ contraindications  Person educated: Patient Education method: Explanation Education comprehension: verbalized understanding   HOME EXERCISE PROGRAM: Access Code: [email or web address] URL: [email or web address] Date: 02/13/2022 Prepared by: Josue Hector  Exercises - Seated Cervical Retraction  - 2-3 x daily - 7 x weekly - 1-2 sets - 10 reps - 5 second hold - Seated Scapular Retraction  - 2-3 x daily - 7 x weekly - 1-2 sets - 10 reps - 5 second hold - Seated Cervical Rotation AROM  - 2-3 x daily - 7 x weekly - 1-2 sets - 10 reps - Seated Cervical Extension AROM  - 2-3 x daily - 7 x weekly - 1-2 sets - 10 reps  ASSESSMENT:  CLINICAL IMPRESSION: Patient is a 66 y.o. female who presents to physical therapy with complaint of chronic neck and LT shoulder pain. Patient demonstrates decreased strength, ROM restriction, reduced flexibility, increased tenderness to palpation and postural abnormalities  which are likely contributing to symptoms of pain and are negatively impacting patient ability to perform ADLs. Patient will benefit from skilled physical therapy services to address these deficits to reduce pain and improve level of function with ADLs   OBJECTIVE IMPAIRMENTS decreased activity tolerance, decreased ROM, decreased strength, hypomobility, increased fascial restrictions, increased muscle spasms, impaired flexibility, impaired UE functional use, improper body mechanics, postural dysfunction, and pain.   ACTIVITY LIMITATIONS carrying, lifting, sleeping, and reach over head  PARTICIPATION LIMITATIONS: meal prep, cleaning, laundry, driving, shopping, community activity, and yard work  PERSONAL FACTORS  chronicity, multiple body regions, multiple surgeries  are also affecting patient's functional outcome.   REHAB POTENTIAL: Good  CLINICAL DECISION MAKING: Stable/uncomplicated  EVALUATION COMPLEXITY: Low   GOALS: SHORT TERM GOALS: Target date: 03/06/2022  Patient will be independent with initial HEP and self-management strategies to improve functional outcomes Baseline:  Goal status: INITIAL    LONG TERM GOALS: Target date: 03/27/2022  Patient will be independent with advanced HEP and self-management strategies to improve functional outcomes Baseline:  Goal status: INITIAL  2.  Patient will improve FOTO score to predicted value to indicate improvement in functional outcomes Baseline: 42% function  Goal status: INITIAL  3.  Patient will demo improved bilateral cervical rotation by at least 10 degrees in order to improve ability to scan environment for safety and while driving. Baseline: See AROM  Goal status: INITIAL  4. Patient will report a decrease in neck pain to no more than 3/10 for improved quality of life and ability to perform UE ADLs  Baseline: 4-5/10 Goal status: INITIAL    PLAN: PT FREQUENCY: 2x/week  PT DURATION: 6 weeks  PLANNED INTERVENTIONS:  Therapeutic exercises, Therapeutic activity, Neuromuscular re-education, Balance training, Gait training, Patient/Family education, Joint manipulation, Joint mobilization, Stair training, Aquatic Therapy, Dry Needling, Electrical stimulation, Spinal manipulation, Spinal mobilization, Cryotherapy, Moist heat, scar mobilization, Taping, Traction, Ultrasound, Biofeedback, Ionotophoresis '4mg'$ /ml Dexamethasone, and Manual therapy.   PLAN FOR NEXT SESSION: Assess response to DN. Manual STM to address pain and restriction. Postural strength and cervical mobility progressions as tolerated.    9:30 AM, 02/13/22 Josue Hector PT DPT  Physical Therapist with [facility]  [phone]

## 2022-02-15 ENCOUNTER — Other Ambulatory Visit: Payer: Self-pay | Admitting: Neurosurgery

## 2022-02-15 DIAGNOSIS — I671 Cerebral aneurysm, nonruptured: Secondary | ICD-10-CM

## 2022-02-16 ENCOUNTER — Other Ambulatory Visit: Payer: Self-pay | Admitting: Neurosurgery

## 2022-02-16 DIAGNOSIS — M542 Cervicalgia: Secondary | ICD-10-CM | POA: Diagnosis not present

## 2022-02-16 DIAGNOSIS — G894 Chronic pain syndrome: Secondary | ICD-10-CM | POA: Diagnosis not present

## 2022-02-16 DIAGNOSIS — M5136 Other intervertebral disc degeneration, lumbar region: Secondary | ICD-10-CM | POA: Diagnosis not present

## 2022-02-16 DIAGNOSIS — M5030 Other cervical disc degeneration, unspecified cervical region: Secondary | ICD-10-CM | POA: Diagnosis not present

## 2022-02-20 ENCOUNTER — Other Ambulatory Visit: Payer: Self-pay | Admitting: Internal Medicine

## 2022-02-21 ENCOUNTER — Encounter (HOSPITAL_COMMUNITY): Payer: Self-pay

## 2022-02-21 ENCOUNTER — Ambulatory Visit (HOSPITAL_COMMUNITY)
Admission: RE | Admit: 2022-02-21 | Discharge: 2022-02-21 | Disposition: A | Discharge status: Home / Self Care | Payer: Medicare HMO | Source: Ambulatory Visit | Attending: Neurosurgery | Admitting: Neurosurgery

## 2022-02-21 ENCOUNTER — Other Ambulatory Visit: Payer: Self-pay | Admitting: Neurosurgery

## 2022-02-21 ENCOUNTER — Telehealth (HOSPITAL_COMMUNITY): Payer: Self-pay | Admitting: Physical Therapy

## 2022-02-21 DIAGNOSIS — Z87891 Personal history of nicotine dependence: Secondary | ICD-10-CM | POA: Diagnosis not present

## 2022-02-21 DIAGNOSIS — I671 Cerebral aneurysm, nonruptured: Secondary | ICD-10-CM

## 2022-02-21 DIAGNOSIS — I720 Aneurysm of carotid artery: Secondary | ICD-10-CM | POA: Insufficient documentation

## 2022-02-21 HISTORY — PX: IR ANGIO VERTEBRAL SEL VERTEBRAL UNI R MOD SED: IMG5368

## 2022-02-21 HISTORY — PX: IR ANGIO INTRA EXTRACRAN SEL INTERNAL CAROTID BILAT MOD SED: IMG5363

## 2022-02-21 LAB — CBC WITH DIFFERENTIAL/PLATELET
Abs Immature Granulocytes: 0.05 10*3/uL (ref 0.00–0.07)
Basophils Absolute: 0.1 10*3/uL (ref 0.0–0.1)
Basophils Relative: 1 %
Eosinophils Absolute: 0.1 10*3/uL (ref 0.0–0.5)
Eosinophils Relative: 1 %
HCT: 42 % (ref 36.0–46.0)
Hemoglobin: 13.7 g/dL (ref 12.0–15.0)
Immature Granulocytes: 1 %
Lymphocytes Relative: 44 %
Lymphs Abs: 4.6 10*3/uL — ABNORMAL HIGH (ref 0.7–4.0)
MCH: 30.4 pg (ref 26.0–34.0)
MCHC: 32.6 g/dL (ref 30.0–36.0)
MCV: 93.1 fL (ref 80.0–100.0)
Monocytes Absolute: 0.7 10*3/uL (ref 0.1–1.0)
Monocytes Relative: 7 %
Neutro Abs: 4.8 10*3/uL (ref 1.7–7.7)
Neutrophils Relative %: 46 %
Platelets: 372 10*3/uL (ref 150–400)
RBC: 4.51 MIL/uL (ref 3.87–5.11)
RDW: 12.2 % (ref 11.5–15.5)
WBC: 10.4 10*3/uL (ref 4.0–10.5)
nRBC: 0 % (ref 0.0–0.2)

## 2022-02-21 LAB — PROTIME-INR
INR: 1.2 (ref 0.8–1.2)
Prothrombin Time: 14.9 seconds (ref 11.4–15.2)

## 2022-02-21 LAB — BASIC METABOLIC PANEL
Anion gap: 6 (ref 5–15)
BUN: 11 mg/dL (ref 8–23)
CO2: 30 mmol/L (ref 22–32)
Calcium: 8.8 mg/dL — ABNORMAL LOW (ref 8.9–10.3)
Chloride: 103 mmol/L (ref 98–111)
Creatinine, Ser: 0.76 mg/dL (ref 0.44–1.00)
GFR, Estimated: >60 mL/min (ref >60)
Glucose, Bld: 73 mg/dL (ref 70–99)
Potassium: 3.3 mmol/L — ABNORMAL LOW (ref 3.5–5.1)
Sodium: 139 mmol/L (ref 135–145)

## 2022-02-21 MED ORDER — FENTANYL CITRATE (PF) 100 MCG/2ML IJ SOLN
INTRAMUSCULAR | Status: AC
  Filled 2022-02-21: qty 2

## 2022-02-21 MED ORDER — MIDAZOLAM HCL 2 MG/2ML IJ SOLN
INTRAMUSCULAR | Status: AC
  Filled 2022-02-21: qty 2

## 2022-02-21 MED ORDER — HEPARIN SODIUM (PORCINE) 1000 UNIT/ML IJ SOLN
INTRAMUSCULAR | Status: AC | PRN
  Administered 2022-02-21: 2000 [IU] via INTRAVENOUS

## 2022-02-21 MED ORDER — IOHEXOL 300 MG/ML  SOLN: 100.0000 mL | Freq: Once | INTRAMUSCULAR | Status: DC | PRN

## 2022-02-21 MED ORDER — FENTANYL CITRATE (PF) 100 MCG/2ML IJ SOLN
INTRAMUSCULAR | Status: AC | PRN
  Administered 2022-02-21: 25 ug via INTRAVENOUS

## 2022-02-21 MED ORDER — HEPARIN SODIUM (PORCINE) 1000 UNIT/ML IJ SOLN
INTRAMUSCULAR | Status: AC
  Filled 2022-02-21: qty 2

## 2022-02-21 MED ORDER — HYDROCODONE-ACETAMINOPHEN 5-325 MG PO TABS: 1.0000 | ORAL_TABLET | ORAL | Status: DC | PRN

## 2022-02-21 MED ORDER — LIDOCAINE HCL 1 % IJ SOLN
INTRAMUSCULAR | Status: AC
  Filled 2022-02-21: qty 20

## 2022-02-21 MED ORDER — SODIUM CHLORIDE 0.9 % IV SOLN: INTRAVENOUS | Status: DC

## 2022-02-21 MED ORDER — MIDAZOLAM HCL 2 MG/2ML IJ SOLN
INTRAMUSCULAR | Status: AC | PRN
  Administered 2022-02-21: 1 mg via INTRAVENOUS

## 2022-02-21 MED ORDER — SODIUM CHLORIDE 0.9 % IV SOLN
INTRAVENOUS | Status: AC | PRN
  Administered 2022-02-21: 10 mL/h via INTRAVENOUS

## 2022-02-21 NOTE — Progress Notes (Signed)
Patient and husband was given discharge instructions. Both verbalized understanding. 

## 2022-02-21 NOTE — H&P (Signed)
Chief Complaint   Aneurysm  History of Present Illness  Gina Wolfe is a 66 y.o. female with a history of cervical spondylosis and previous neck surgery.  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  After lengthy discussion of treatment options including expectant observation, the patient elected to proceed with diagnostic angiogram.  Past Medical History   Past Medical History:  Diagnosis Date   Arthritis    Asthma    Chronic pain syndrome    Diverticulitis    GERD (gastroesophageal reflux disease)    IBS (irritable bowel syndrome)    Spinal headache     Past Surgical History   Past Surgical History:  Procedure Laterality Date   ABDOMINAL HYSTERECTOMY     BACK SURGERY     x3 previous lumbar, x3 cervical surgeries   CHOLECYSTECTOMY     COLONOSCOPY  05/20/10   KCM:[id number] hemorrhoids otherwise normal/EGD:distal esophageal erosions otherwise normal   COLONOSCOPY WITH PROPOFOL N/A 08/15/2021   Procedure: COLONOSCOPY WITH PROPOFOL;  Surgeon: Daneil Dolin, MD;  [facility];  Service: Endoscopy;  Laterality: N/A;  7:30am   CYST EXCISION Right    Eye cyst   EYE SURGERY Right    removed a cyst   LUMBAR LAMINECTOMY/DECOMPRESSION MICRODISCECTOMY N/A 07/13/2016   Procedure: DISCECTOMY Lumbar four-five;  Surgeon: Newman Pies, MD;  Location: [place];  Service: Neurosurgery;  Laterality: N/A;   MENISCUS REPAIR  2007   R knee    POSTERIOR CERVICAL FUSION/FORAMINOTOMY N/A 04/10/2013   Procedure: POSTERIOR CERVICAL FUSION/FORAMINOTOMY LEVEL 2;  Surgeon: Sinclair Ship, MD;  Location: [place];  Service: Orthopedics;  Laterality: N/A;  Posterior cervical decompression fusion, cervical 5-6, cervical 6-7 with instrumentation and allograft.   TOTAL KNEE ARTHROPLASTY Right 01/03/2021   Procedure: TOTAL KNEE ARTHROPLASTY;  Surgeon:  Gaynelle Arabian, MD;  Location: [facility];  Service: Orthopedics;  Laterality: Right;  52mn   TUBAL LIGATION      Social History   Social History   Tobacco Use   Smoking status: Former    Types: Cigarettes    Quit date: 08/25/2007    Years since quitting: 14.5   Smokeless tobacco: Never  Vaping Use   Vaping Use: Never used  Substance Use Topics   Alcohol use: No    Alcohol/week: 0.0 standard drinks of alcohol   Drug use: No    Medications   Prior to Admission medications   Medication Sig Start Date End Date Taking? Authorizing Provider  albuterol (PROVENTIL HFA;VENTOLIN HFA) 108 (90 Base) MCG/ACT inhaler Inhale 2 puffs into the lungs every 6 (six) hours as needed for wheezing or shortness of breath.    Yes [provider]  aspirin EC 81 MG tablet Take 81 mg by mouth daily. Swallow whole.   Yes [provider]  cholecalciferol (VITAMIN D3) 25 MCG (1000 UNIT) tablet Take 1,000 Units by mouth daily.   Yes [provider]  cyclobenzaprine (FLEXERIL) 10 MG tablet Take 1 tablet (10 mg total) by mouth 2 (two) times daily as needed for muscle spasms. Patient taking differently: Take 10 mg by mouth 3 (three) times daily as needed for muscle spasms. 06/15/21  Yes FMarcello Fennel PA-C  estrogens, conjugated, (PREMARIN) 0.625 MG tablet Take 0.625 mg by mouth daily with breakfast.    Yes [provider]  furosemide (LASIX) 20 MG tablet Take 20 mg  by mouth daily as needed for edema.    Yes [provider]  gabapentin (NEURONTIN) 100 MG capsule Take 100 mg by mouth 3 (three) times daily. 11/25/21  Yes [provider]  linaclotide (LINZESS) 290 MCG CAPS capsule Take 290 mcg by mouth daily.   Yes [provider]  metoprolol succinate (TOPROL XL) 25 MG 24 hr tablet Take 0.5 tablets (12.5 mg total) by mouth daily. 01/18/22  Yes Strader, Fransisco Hertz, PA-C  oxyCODONE-acetaminophen (PERCOCET) 10-325 MG tablet Take 1 tablet by mouth every 4  (four) hours as needed for pain. 05/02/21  Yes [provider]  pantoprazole (PROTONIX) 40 MG tablet Take 40 mg by mouth daily.   Yes [provider]  promethazine (PHENERGAN) 25 MG tablet Take 25 mg by mouth every 6 (six) hours as needed for nausea or vomiting.   Yes [provider]    Allergies   Allergies  Allergen Reactions   Clindamycin Hcl Anaphylaxis   Codeine Nausea Only    Review of Systems  ROS  Neurologic Exam  Awake, alert, oriented Memory and concentration grossly intact Speech fluent, appropriate CN grossly intact Motor exam: Upper Extremities Deltoid Bicep Tricep Grip  Right 5/5 5/5 5/5 5/5  Left 5/5 5/5 5/5 5/5   Lower Extremities IP Quad PF DF EHL  Right 5/5 5/5 5/5 5/5 5/5  Left 5/5 5/5 5/5 5/5 5/5   Sensation grossly intact to LT  Imaging  CT angiogram of the head was personally reviewed and demonstrates an approximately 3 to 4 mm inferolaterally directed aneurysm of the right supraclinoid internal carotid artery.  Impression  - 66 y.o. female with incidental discovery of small right carotid aneurysm.  Although expectant observation was recommended, the patient wished to proceed with diagnostic angiogram for possible aneurysm treatment.  Plan  -We will plan on proceeding with diagnostic cerebral angiogram  I have reviewed the details of the procedure as well as the expected postprocedural course and recovery.  We have also discussed the associated risks, benefits, and alternatives to the angiogram.  All her questions today were answered and she provided informed consent to proceed.  Consuella Lose, MD [facility]

## 2022-02-21 NOTE — Telephone Encounter (Signed)
Patient called to cx she had some outpt surgery and will not be here tomorrow

## 2022-02-21 NOTE — Brief Op Note (Signed)
  NEUROSURGERY BRIEF OPERATIVE  NOTE   PREOP DX: RICA aneurysm  POSTOP DX: Same  PROCEDURE: Diagnostic cerebral angiogram  SURGEON: Dr. Consuella Lose, MD  ANESTHESIA: IV Sedation with Local  EBL: Minimal  SPECIMENS: None  COMPLICATIONS: None  CONDITION: Stable to recovery  FINDINGS (Full report in [facility]): 1. ~73m inferiorly projecting supraclinoid RICA aneurysm   NConsuella Lose MD [facility]

## 2022-02-22 ENCOUNTER — Encounter (HOSPITAL_COMMUNITY): Payer: Medicare HMO | Admitting: Physical Therapy

## 2022-02-27 ENCOUNTER — Ambulatory Visit (HOSPITAL_COMMUNITY): Payer: Medicare HMO | Attending: Neurosurgery | Admitting: Physical Therapy

## 2022-02-27 ENCOUNTER — Encounter (HOSPITAL_COMMUNITY): Payer: Self-pay | Admitting: Physical Therapy

## 2022-02-27 DIAGNOSIS — Z981 Arthrodesis status: Secondary | ICD-10-CM | POA: Diagnosis not present

## 2022-02-27 DIAGNOSIS — M542 Cervicalgia: Secondary | ICD-10-CM | POA: Diagnosis not present

## 2022-02-27 DIAGNOSIS — G8929 Other chronic pain: Secondary | ICD-10-CM | POA: Diagnosis not present

## 2022-02-27 DIAGNOSIS — M25512 Pain in left shoulder: Secondary | ICD-10-CM | POA: Insufficient documentation

## 2022-02-27 DIAGNOSIS — I671 Cerebral aneurysm, nonruptured: Secondary | ICD-10-CM | POA: Diagnosis not present

## 2022-02-27 NOTE — Therapy (Signed)
OUTPATIENT PHYSICAL THERAPY TREATMENT NOTE   Patient Name: Gina Wolfe MRN: [id number] DOB:05/23/1956, 66 y.o., female Today's Date: 02/27/2022   PT End of Session - 02/27/22 0959     Visit Number 2    Number of Visits 12    Date for PT Re-Evaluation 03/27/22    Authorization Type [employer]    Authorization Time Period 12 visits approved 6/5-7/17    Authorization - Visit Number 2    Authorization - Number of Visits 12    Progress Note Due on Visit 10    PT Start Time 1000    PT Stop Time 1038    PT Time Calculation (min) 38 min    Activity Tolerance Patient tolerated treatment well    Behavior During Therapy WFL for tasks assessed/performed             Past Medical History:  Diagnosis Date   Arthritis    Asthma    Chronic pain syndrome    Diverticulitis    GERD (gastroesophageal reflux disease)    IBS (irritable bowel syndrome)    Spinal headache    Past Surgical History:  Procedure Laterality Date   ABDOMINAL HYSTERECTOMY     BACK SURGERY     x3 previous lumbar, x3 cervical surgeries   CHOLECYSTECTOMY     COLONOSCOPY  05/20/10   QBH:ALPFXTKW hemorrhoids otherwise normal/EGD:distal esophageal erosions otherwise normal   COLONOSCOPY WITH PROPOFOL N/A 08/15/2021   Procedure: COLONOSCOPY WITH PROPOFOL;  Surgeon: Daneil Dolin, MD;  [facility];  Service: Endoscopy;  Laterality: N/A;  7:30am   CYST EXCISION Right    Eye cyst   EYE SURGERY Right    removed a cyst   IR ANGIO INTRA EXTRACRAN SEL INTERNAL CAROTID BILAT MOD SED  02/21/2022   IR ANGIO VERTEBRAL SEL VERTEBRAL UNI R MOD SED  02/21/2022   LUMBAR LAMINECTOMY/DECOMPRESSION MICRODISCECTOMY N/A 07/13/2016   Procedure: DISCECTOMY Lumbar four-five;  Surgeon: Newman Pies, MD;  Location: [place];  Service: Neurosurgery;  Laterality: N/A;   MENISCUS REPAIR  2007   R knee    POSTERIOR CERVICAL FUSION/FORAMINOTOMY N/A 04/10/2013   Procedure: POSTERIOR CERVICAL FUSION/FORAMINOTOMY LEVEL 2;  Surgeon:  Sinclair Ship, MD;  Location: [place];  Service: Orthopedics;  Laterality: N/A;  Posterior cervical decompression fusion, cervical 5-6, cervical 6-7 with instrumentation and allograft.   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 Neck is still sore on sides. She has been trying to work on posture. Dry needling didn't really help much.   PERTINENT HISTORY:  3 x neck surgery ( ACDF, fusion), RT TKA, several lumbar surgeries, cerebrovascular aneurysm   PAIN:  Are you having pain? Yes: NPRS scale: 4/10 Pain location: neck  Pain description: aching, throbbing  Aggravating factors: Movement  Relieving factors: meds, rest, heat   PRECAUTIONS: None  WEIGHT BEARING RESTRICTIONS No  FALLS:  Has patient fallen in last 6 months? No  LIVING ENVIRONMENT: Lives with: lives with their  spouse Lives in: Mobile home  OCCUPATION: Retired   PLOF: Independent  PATIENT GOALS Be pain free   OBJECTIVE:   DIAGNOSTIC FINDINGS:  IMPRESSION: No acute finding. Previous ACDF at C4-5. Previous anterior and posterior fusion from C5-C7. Satisfactory appearance in the operative region allowing for hardware artifact.   Mild bulging of the discs at C2-3 and C3-4. No compressive canal stenosis. Mild foraminal narrowing on the left at C3-4.IMPRESSION: Cervical spondylosis and postoperative changes, as outlined.   At C3-C4, there is progressive multifactorial minimal relative spinal canal narrowing (without spinal cord mass effect), and bilateral neural foraminal narrowing (mild/moderate right, moderate/severe left).   Findings have otherwise not significantly changed from the cervical spine MRI of 01/18/2017. No significant spinal canal stenosis at the remaining levels. As before, vertebral osteophytosis results in mild/moderate right neural foraminal narrowing at C6-C7.   Straightening of the expected cervical lordosis.   Trace C3-C4 grade 1 anterolisthesis.  PATIENT SURVEYS:  FOTO 42% function    COGNITION: Overall cognitive status: Within functional limits for tasks assessed   POSTURE: rounded shoulders and forward head  PALPATION: Mod TTP about bilateral upper trapezius and levator (LT>RT)    CERVICAL ROM:   Active ROM A/PROM (deg) Eval 02/13/22  Flexion 29  Extension 18  Right lateral flexion   Left lateral flexion   Right rotation 32  Left rotation 31   (Blank rows = not tested)  UPPER EXTREMITY ROM: WNL    UPPER EXTREMITY MMT:  MMT Right Eval 02/13/22 Left Eval 02/13/22  Shoulder flexion 4+ 4+  Shoulder extension    Shoulder abduction 4+ 4  Shoulder adduction    Shoulder extension    Shoulder internal rotation 5 4+  Shoulder external rotation 5 4+  Middle trapezius    Lower trapezius    Elbow flexion    Elbow extension    Wrist flexion     Wrist extension    Wrist ulnar deviation    Wrist radial deviation    Wrist pronation    Wrist supination    Grip strength     (Blank rows = not tested)   TODAY'S TREATMENT:  02/27/22 Chin tuck 2x 10  Scap retraction 1x 20  Shoulder adduction stretch 10 x 5 seconds bilateral  Cervical AROM (rotation and flexion/ extension)  UT stretch 3x 30 second holds bilateral  L levator scapulae stretch 3x 20 second holds Scap retraction with GH ER 2x 10 RTB Row RTB 2x 15  Shoulder extensions RTB 2x 15 STM to L levator scapulae and UT  02/13/22 Chin tuck  Scap retraction Shoulder adduction stretch Cervical AROM (rotation and flexion/ extension)   Trigger Point Dry-Needling  Treatment instructions: Expect mild to moderate muscle soreness. S/S of pneumothorax if dry needled over a lung field, and to seek immediate medical  attention should they occur. Patient verbalized understanding of these instructions and education.  Patient Consent Given: Yes Education handout provided: Yes Muscles treated: Bilateral upper trap  Electrical stimulation performed: No Parameters: N/A Treatment response/outcome: good tolerance, several twitch response noted   Manual STM to bilateral upper trap pre and post dry needling for trigger point identification and surface area preparation     PATIENT EDUCATION:  Education details: on evaluation findings, POC, HEP and dry needling application/ precaution/ contraindications  6/19 HEP Person educated: Patient Education method: Explanation Education comprehension: verbalized understanding   HOME EXERCISE PROGRAM: Access Code: [email or web address] URL: [email or web address] Date: 02/13/2022 Prepared by: Josue Hector  Exercises - Seated Cervical Retraction  - 2-3 x daily - 7 x weekly - 1-2 sets - 10 reps - 5 second hold - Seated Scapular Retraction  - 2-3 x daily - 7 x weekly - 1-2 sets - 10 reps - 5 second hold - Seated Cervical Rotation AROM  - 2-3  x daily - 7 x weekly - 1-2 sets - 10 reps - Seated Cervical Extension AROM  - 2-3 x daily - 7 x weekly - 1-2 sets - 10 reps  02/27/22 - Shoulder External Rotation and Scapular Retraction with Resistance  - 1 x daily - 7 x weekly - 2 sets - 10 reps - Standing Shoulder Row with Anchored Resistance  - 1 x daily - 7 x weekly - 2 sets - 15 reps - Shoulder extension with resistance - Neutral  - 1 x daily - 7 x weekly - 2 sets - 15 reps  ASSESSMENT:  CLINICAL IMPRESSION: Patient given minimal cueing for mechanics of previously completed exercises with good carry over. Continued ROM limitations present with greatest limitation to L rotation. Began resisted postural and periscap strengthening. Patient with hyperactive and tender levator scapulae and L UT, states improvement in symptoms following STM. Patient will continue to benefit from physical therapy in order to improve function and reduce impairment.    OBJECTIVE IMPAIRMENTS decreased activity tolerance, decreased ROM, decreased strength, hypomobility, increased fascial restrictions, increased muscle spasms, impaired flexibility, impaired UE functional use, improper body mechanics, postural dysfunction, and pain.   ACTIVITY LIMITATIONS carrying, lifting, sleeping, and reach over head  PARTICIPATION LIMITATIONS: meal prep, cleaning, laundry, driving, shopping, community activity, and yard work  PERSONAL FACTORS  chronicity, multiple body regions, multiple surgeries  are also affecting patient's functional outcome.   REHAB POTENTIAL: Good  CLINICAL DECISION MAKING: Stable/uncomplicated  EVALUATION COMPLEXITY: Low   GOALS: SHORT TERM GOALS: Target date: 03/06/2022  Patient will be independent with initial HEP and self-management strategies to improve functional outcomes Baseline:  Goal status: IN PROGRESS    LONG TERM GOALS: Target date: 03/27/2022  Patient will be independent with advanced HEP and self-management strategies to improve  functional outcomes Baseline:  Goal status: IN PROGRESS  2.  Patient will improve FOTO score to predicted value to indicate improvement in functional outcomes Baseline: 42% function  Goal status: IN PROGRESS  3.  Patient will demo improved bilateral cervical rotation by at least 10 degrees in order to improve ability to scan environment for safety and while driving. Baseline: See AROM  Goal status: IN PROGRESS  4. Patient will report a decrease in neck pain to no more than 3/10 for improved quality of life and ability to perform UE ADLs  Baseline: 4-5/10 Goal status: IN PROGRESS    PLAN: PT FREQUENCY: 2x/week  PT DURATION: 6 weeks  PLANNED INTERVENTIONS: Therapeutic exercises, Therapeutic activity, Neuromuscular  re-education, Balance training, Gait training, Patient/Family education, Joint manipulation, Joint mobilization, Stair training, Aquatic Therapy, Dry Needling, Electrical stimulation, Spinal manipulation, Spinal mobilization, Cryotherapy, Moist heat, scar mobilization, Taping, Traction, Ultrasound, Biofeedback, Ionotophoresis '4mg'$ /ml Dexamethasone, and Manual therapy.   PLAN FOR NEXT SESSION: Assess response to DN. Manual STM to address pain and restriction. Postural strength and cervical mobility progressions as tolerated. Possibly DN to levator scap.   10:00 AM, 02/27/22 Mearl Latin PT, DPT Physical Therapist at [facility]

## 2022-03-06 DIAGNOSIS — I671 Cerebral aneurysm, nonruptured: Secondary | ICD-10-CM | POA: Diagnosis not present

## 2022-03-06 DIAGNOSIS — Z6827 Body mass index (BMI) 27.0-27.9, adult: Secondary | ICD-10-CM | POA: Diagnosis not present

## 2022-03-07 ENCOUNTER — Ambulatory Visit (HOSPITAL_COMMUNITY): Payer: Medicare HMO | Admitting: Physical Therapy

## 2022-03-07 ENCOUNTER — Other Ambulatory Visit (HOSPITAL_COMMUNITY): Payer: Self-pay | Admitting: Neurosurgery

## 2022-03-07 ENCOUNTER — Encounter (HOSPITAL_COMMUNITY): Payer: Self-pay | Admitting: Physical Therapy

## 2022-03-07 DIAGNOSIS — M542 Cervicalgia: Secondary | ICD-10-CM | POA: Diagnosis not present

## 2022-03-07 DIAGNOSIS — M25512 Pain in left shoulder: Secondary | ICD-10-CM

## 2022-03-07 DIAGNOSIS — I671 Cerebral aneurysm, nonruptured: Secondary | ICD-10-CM

## 2022-03-13 ENCOUNTER — Encounter (HOSPITAL_COMMUNITY): Payer: Self-pay | Admitting: Physical Therapy

## 2022-03-13 ENCOUNTER — Ambulatory Visit (HOSPITAL_COMMUNITY): Payer: Medicare HMO | Attending: Student | Admitting: Physical Therapy

## 2022-03-13 DIAGNOSIS — M542 Cervicalgia: Secondary | ICD-10-CM | POA: Diagnosis not present

## 2022-03-13 DIAGNOSIS — M25512 Pain in left shoulder: Secondary | ICD-10-CM | POA: Diagnosis not present

## 2022-03-13 NOTE — Therapy (Signed)
OUTPATIENT PHYSICAL THERAPY TREATMENT NOTE   Patient Name: Gina Wolfe MRN: [id number] DOB:04/30/1956, 66 y.o., female Today's Date: 03/13/2022   PT End of Session - 03/13/22 0732     Visit Number 4    Number of Visits 12    Date for PT Re-Evaluation 03/27/22    Authorization Type [employer]    Authorization Time Period 12 visits approved 6/5-7/17    Authorization - Visit Number 4    Authorization - Number of Visits 12    Progress Note Due on Visit 10    PT Start Time 0732    PT Stop Time 0810    PT Time Calculation (min) 38 min    Activity Tolerance Patient tolerated treatment well    Behavior During Therapy WFL for tasks assessed/performed             Past Medical History:  Diagnosis Date   Arthritis    Asthma    Chronic pain syndrome    Diverticulitis    GERD (gastroesophageal reflux disease)    IBS (irritable bowel syndrome)    Spinal headache    Past Surgical History:  Procedure Laterality Date   ABDOMINAL HYSTERECTOMY     BACK SURGERY     x3 previous lumbar, x3 cervical surgeries   CHOLECYSTECTOMY     COLONOSCOPY  05/20/10   OVF:IEPPIRJJ hemorrhoids otherwise normal/EGD:distal esophageal erosions otherwise normal   COLONOSCOPY WITH PROPOFOL N/A 08/15/2021   Procedure: COLONOSCOPY WITH PROPOFOL;  Surgeon: Daneil Dolin, MD;  [facility]: [facility];  Service: Endoscopy;  Laterality: N/A;  7:30am   CYST EXCISION Right    Eye cyst   EYE SURGERY Right    removed a cyst   IR ANGIO INTRA EXTRACRAN SEL INTERNAL CAROTID BILAT MOD SED  02/21/2022   IR ANGIO VERTEBRAL SEL VERTEBRAL UNI R MOD SED  02/21/2022   LUMBAR LAMINECTOMY/DECOMPRESSION MICRODISCECTOMY N/A 07/13/2016   Procedure: DISCECTOMY Lumbar four-five;  Surgeon: Newman Pies, MD;  Location: [facility];  Service: Neurosurgery;  Laterality: N/A;   MENISCUS REPAIR  2007   R knee    POSTERIOR CERVICAL FUSION/FORAMINOTOMY N/A 04/10/2013   Procedure: POSTERIOR CERVICAL FUSION/FORAMINOTOMY LEVEL 2;  Surgeon:  Sinclair Ship, MD;  Location: [facility];  Service: Orthopedics;  Laterality: N/A;  Posterior cervical decompression fusion, cervical 5-6, cervical 6-7 with instrumentation and allograft.   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 Exercises have been going good.   PERTINENT HISTORY:  3 x neck surgery ( ACDF, fusion), RT TKA, several lumbar surgeries, cerebrovascular aneurysm   PAIN:  Are you having pain? Yes: NPRS scale: 3/10 Pain location: neck  Pain description: aching, throbbing  Aggravating factors: Movement  Relieving factors: meds, rest, heat   PRECAUTIONS: None  WEIGHT BEARING RESTRICTIONS No  FALLS:  Has patient fallen in last 6 months? No  LIVING ENVIRONMENT: Lives with: lives with their spouse Lives in: Mobile home  OCCUPATION: Retired   PLOF: Independent  PATIENT GOALS Be pain free    OBJECTIVE:   DIAGNOSTIC FINDINGS:  IMPRESSION: No acute finding. Previous ACDF at C4-5. Previous anterior and posterior fusion from C5-C7. Satisfactory appearance in the operative region allowing for hardware artifact.   Mild bulging of the discs at C2-3 and C3-4. No compressive canal stenosis. Mild foraminal narrowing on the left at C3-4.IMPRESSION: Cervical spondylosis and postoperative changes, as outlined.   At C3-C4, there is progressive multifactorial minimal relative spinal canal narrowing (without spinal cord mass effect), and bilateral neural foraminal narrowing (mild/moderate right, moderate/severe left).   Findings have otherwise not significantly changed from the cervical spine MRI of 01/18/2017. No significant spinal canal stenosis at the remaining levels. As before, vertebral osteophytosis results in mild/moderate right neural foraminal narrowing at C6-C7.   Straightening of the expected cervical lordosis.   Trace C3-C4 grade 1 anterolisthesis.  PATIENT SURVEYS:  FOTO 42% function    COGNITION: Overall cognitive status: Within functional limits for tasks assessed   POSTURE: rounded shoulders and forward head  PALPATION: Mod TTP about bilateral upper trapezius and levator (LT>RT)    CERVICAL ROM:   Active ROM A/PROM (deg) Eval 02/13/22  Flexion 29  Extension 18  Right lateral flexion   Left lateral flexion   Right rotation 32  Left rotation 31   (Blank rows = not tested)  UPPER EXTREMITY ROM: WNL    UPPER EXTREMITY MMT:  MMT Right Eval 02/13/22 Left Eval 02/13/22  Shoulder flexion 4+ 4+  Shoulder extension    Shoulder abduction 4+ 4  Shoulder adduction    Shoulder extension    Shoulder internal rotation 5 4+  Shoulder external rotation 5 4+  Middle trapezius    Lower trapezius    Elbow flexion    Elbow extension    Wrist flexion    Wrist extension    Wrist ulnar deviation    Wrist radial deviation    Wrist pronation    Wrist  supination    Grip strength     (Blank rows = not tested)   TODAY'S TREATMENT:  03/13/22 Chin tuck 15 x 3"   Scap retraction 15 x 3"  UT stretch 3x 30 second holds bilateral  L levator scapulae stretch 3x 30 second holds Shoulder adduction stretch 10 x 5 seconds bilateral  Bilat shoulder ER with BTB 2 x10 Horizontal abduction BTB 2x 10  Shoulder flexion with band between hands BTB 2x 10 Shoulder extension BTB 2x 10 Row BTB 2x 10  Prone horizontal abduction 2x 10 1#  Manual STM to L UT/levator scapulae    03/07/22 Chin tuck 15 x 3"   Scap retraction 15 x 3"  Shoulder adduction stretch 10 x 5 seconds bilateral  Cervical AROM (rotation and flexion/ extension)  UT stretch 3x 30 second holds bilateral  L levator scapulae stretch 3x 30 second holds Bilat shoulder  ER with BTB 2 x10 Row BTB 2x 10  Shoulder extensions BTB 2 x10   Pec stretch in door way 3 x 20"    IASTM to L levator scapulae and UT  02/27/22 Chin tuck 2x 10  Scap retraction 1x 20  Shoulder adduction stretch 10 x 5 seconds bilateral  Cervical AROM (rotation and flexion/ extension)  UT stretch 3x 30 second holds bilateral  L levator scapulae stretch 3x 20 second holds Scap retraction with GH ER 2x 10 RTB Row RTB 2x 15  Shoulder extensions RTB 2x 15 STM to L levator scapulae and UT  02/13/22 Chin tuck  Scap retraction Shoulder adduction stretch Cervical AROM (rotation and flexion/ extension)   Trigger Point Dry-Needling  Treatment instructions: Expect mild to moderate muscle soreness. S/S of pneumothorax if dry needled over a lung field, and to seek immediate medical attention should they occur. Patient verbalized understanding of these instructions and education.  Patient Consent Given: Yes Education handout provided: Yes Muscles treated: Bilateral upper trap  Electrical stimulation performed: No Parameters: N/A Treatment response/outcome: good tolerance, several twitch response noted   Manual STM to  bilateral upper trap pre and post dry needling for trigger point identification and surface area preparation     PATIENT EDUCATION:  Education details: on evaluation findings, POC, HEP and dry needling application/ precaution/ contraindications  6/19 HEP Person educated: Patient Education method: Explanation Education comprehension: verbalized understanding   HOME EXERCISE PROGRAM: Access Code: [email or web address] URL: [email or web address]  Prepared by: Josue Hector 03/07/22 Access Code: [facility] URL: [email or web address]  Date: 03/07/2022 Prepared by: Josue Hector  02/27/22 - Shoulder External Rotation and Scapular Retraction with Resistance  - 1 x daily - 7 x weekly - 2 sets - 10 reps - Standing Shoulder Row with Anchored Resistance  - 1 x daily - 7 x weekly - 2 sets - 15 reps - Shoulder extension with resistance - Neutral  - 1 x daily - 7 x weekly - 2 sets - 15 reps  Exercises - Single Arm Doorway Pec Stretch at 90 Degrees Abduction  - 1-2 x daily - 7 x weekly - 1 sets - 3 reps - 30 second hold  Date: 02/13/2022  Exercises - Seated Cervical Retraction  - 2-3 x daily - 7 x weekly - 1-2 sets - 10 reps - 5 second hold - Seated Scapular Retraction  - 2-3 x daily - 7 x weekly - 1-2 sets - 10 reps - 5 second hold - Seated Cervical Rotation AROM  - 2-3 x daily - 7 x weekly - 1-2 sets - 10 reps - Seated Cervical Extension AROM  - 2-3 x daily - 7 x weekly - 1-2 sets - 10 reps    ASSESSMENT:  CLINICAL IMPRESSION: Began session with previously completed exercises which are performed with good mechanics. Additional postural/periscapular strengthening added which is tolerated well but does c/o L arm symptoms/fatigue following. Completed manual with decrease in symptoms and reduced tissue tension following. Patient will continue to benefit from physical therapy in order to improve function and reduce impairment.   OBJECTIVE IMPAIRMENTS decreased activity  tolerance, decreased ROM, decreased strength, hypomobility, increased fascial restrictions, increased muscle spasms, impaired flexibility, impaired UE functional use, improper body mechanics, postural dysfunction, and pain.   ACTIVITY LIMITATIONS carrying, lifting, sleeping, and reach over head  PARTICIPATION LIMITATIONS: meal prep, cleaning, laundry, driving, shopping, community activity, and yard work  PERSONAL FACTORS  chronicity, multiple body regions, multiple surgeries  are also affecting patient's functional outcome.  REHAB POTENTIAL: Good  CLINICAL DECISION MAKING: Stable/uncomplicated  EVALUATION COMPLEXITY: Low   GOALS: SHORT TERM GOALS: Target date: 03/06/2022  Patient will be independent with initial HEP and self-management strategies to improve functional outcomes Baseline:  Goal status: IN PROGRESS    LONG TERM GOALS: Target date: 03/27/2022  Patient will be independent with advanced HEP and self-management strategies to improve functional outcomes Baseline:  Goal status: IN PROGRESS  2.  Patient will improve FOTO score to predicted value to indicate improvement in functional outcomes Baseline: 42% function  Goal status: IN PROGRESS  3.  Patient will demo improved bilateral cervical rotation by at least 10 degrees in order to improve ability to scan environment for safety and while driving. Baseline: See AROM  Goal status: IN PROGRESS  4. Patient will report a decrease in neck pain to no more than 3/10 for improved quality of life and ability to perform UE ADLs  Baseline: 4-5/10 Goal status: IN PROGRESS    PLAN: PT FREQUENCY: 2x/week  PT DURATION: 6 weeks  PLANNED INTERVENTIONS: Therapeutic exercises, Therapeutic activity, Neuromuscular re-education, Balance training, Gait training, Patient/Family education, Joint manipulation, Joint mobilization, Stair training, Aquatic Therapy, Dry Needling, Electrical stimulation, Spinal manipulation, Spinal  mobilization, Cryotherapy, Moist heat, scar mobilization, Taping, Traction, Ultrasound, Biofeedback, Ionotophoresis '4mg'$ /ml Dexamethasone, and Manual therapy.   PLAN FOR NEXT SESSION: Manual STM to address pain and restriction. Postural strength and cervical mobility progressions as tolerated.   7:33 AM, 03/13/22 Mearl Latin PT, DPT Physical Therapist at [facility]

## 2022-03-17 ENCOUNTER — Encounter (HOSPITAL_COMMUNITY): Payer: Self-pay

## 2022-03-17 NOTE — Pre-Procedure Instructions (Signed)
Surgical Instructions    Your procedure is scheduled on Friday, July 14th.  Report to [facility] Main Entrance "A" at 09:45 A.M., then check in with the Admitting office.  Call this number if you have problems the morning of surgery:  [phone]   If you have any questions prior to your surgery date call [phone]: Open Monday-Friday 8am-4pm    Remember:  Do not eat after midnight the night before your surgery  You may drink clear liquids until 08:45 AM the morning of your surgery.   Clear liquids allowed are: Water, Non-Citrus Juices (without pulp), Carbonated Beverages, Clear Tea, Black Coffee Only (NO MILK, CREAM OR POWDERED CREAMER of any kind), and Gatorade.    Take these medicines the morning of surgery with A SIP OF WATER  metoprolol succinate (TOPROL XL)  pantoprazole (PROTONIX)    If needed: albuterol (PROVENTIL HFA;VENTOLIN HFA)- if needed, bring with you day of surgery cyclobenzaprine (FLEXERIL)  gabapentin (NEURONTIN)  oxyCODONE-acetaminophen (PERCOCET)  promethazine (PHENERGAN)    Follow your surgeon's instructions on when to stop Aspirin and ticagrelor (BRILINTA).  If no instructions were given by your surgeon then you will need to call the office to get those instructions.     As of today, STOP taking any Aspirin (unless otherwise instructed by your surgeon) Aleve, Naproxen, Ibuprofen, Motrin, Advil, Goody's, BC's, all herbal medications, fish oil, and all vitamins.                     Do NOT Smoke (Tobacco/Vaping) for 24 hours prior to your procedure.  If you use a CPAP at night, you may bring your mask/headgear for your overnight stay.   Contacts, glasses, piercing's, hearing aid's, dentures or partials may not be worn into surgery, please bring cases for these belongings.    For patients admitted to the hospital, discharge time will be determined by your treatment team.   Patients discharged the day of surgery will not be allowed to drive home, and  someone needs to stay with them for 24 hours.  SURGICAL WAITING ROOM VISITATION Patients having surgery or a procedure may have two support people in the waiting room. These visitors may be switched out with other visitors if needed. Children under the age of 54 must have an adult accompany them who is not the patient. If the patient needs to stay at the hospital during part of their recovery, the visitor guidelines for inpatient rooms apply.  Please refer to the [facility] website for the visitor guidelines for Inpatients (after your surgery is over and you are in a regular room).    Special instructions:   Gina Wolfe- Preparing For Surgery  Before surgery, you can play an important role. Because skin is not sterile, your skin needs to be as free of germs as possible. You can reduce the number of germs on your skin by washing with CHG (chlorahexidine gluconate) Soap before surgery.  CHG is an antiseptic cleaner which kills germs and bonds with the skin to continue killing germs even after washing.    Oral Hygiene is also important to reduce your risk of infection.  Remember - BRUSH YOUR TEETH THE MORNING OF SURGERY WITH YOUR REGULAR TOOTHPASTE  Please do not use if you have an allergy to CHG or antibacterial soaps. If your skin becomes reddened/irritated stop using the CHG.  Do not shave (including legs and underarms) for at least 48 hours prior to first CHG shower. It is OK to shave your  face.  Please follow these instructions carefully.   Shower the NIGHT BEFORE SURGERY and the MORNING OF SURGERY  If you chose to wash your hair, wash your hair first as usual with your normal shampoo.  After you shampoo, rinse your hair and body thoroughly to remove the shampoo.  Use CHG Soap as you would any other liquid soap. You can apply CHG directly to the skin and wash gently with a scrungie or a clean washcloth.   Apply the CHG Soap to your body ONLY FROM THE NECK DOWN.  Do not use on open  wounds or open sores. Avoid contact with your eyes, ears, mouth and genitals (private parts). Wash Face and genitals (private parts)  with your normal soap.   Wash thoroughly, paying special attention to the area where your surgery will be performed.  Thoroughly rinse your body with warm water from the neck down.  DO NOT shower/wash with your normal soap after using and rinsing off the CHG Soap.  Pat yourself dry with a CLEAN TOWEL.  Wear CLEAN PAJAMAS to bed the night before surgery  Place CLEAN SHEETS on your bed the night before your surgery  DO NOT SLEEP WITH PETS.   Day of Surgery: Take a shower with CHG soap. Do not wear jewelry or makeup Do not wear lotions, powders, perfumes, or deodorant. Do not shave 48 hours prior to surgery.   Do not bring valuables to the hospital.  [facility] is not responsible for any belongings or valuables. Do not wear nail polish, gel polish, artificial nails, or any other type of covering on natural nails (fingers and toes) If you have artificial nails or gel coating that need to be removed by a nail salon, please have this removed prior to surgery. Artificial nails or gel coating may interfere with anesthesia's ability to adequately monitor your vital signs. Wear Clean/Comfortable clothing the morning of surgery Remember to brush your teeth WITH YOUR REGULAR TOOTHPASTE.   Please read over the following fact sheets that you were given.    If you received a COVID test during your pre-op visit  it is requested that you wear a mask when out in public, stay away from anyone that may not be feeling well and notify your surgeon if you develop symptoms. If you have been in contact with anyone that has tested positive in the last 10 days please notify you surgeon.

## 2022-03-20 ENCOUNTER — Encounter (HOSPITAL_COMMUNITY): Payer: Self-pay

## 2022-03-20 ENCOUNTER — Encounter (HOSPITAL_COMMUNITY): Payer: Medicare HMO | Admitting: Physical Therapy

## 2022-03-20 ENCOUNTER — Encounter (HOSPITAL_COMMUNITY)
Admission: RE | Admit: 2022-03-20 | Discharge: 2022-03-20 | Disposition: A | Discharge status: Home / Self Care | Payer: Medicare HMO | Source: Ambulatory Visit | Attending: Neurosurgery | Admitting: Neurosurgery

## 2022-03-20 ENCOUNTER — Other Ambulatory Visit: Payer: Self-pay

## 2022-03-20 VITALS — BP 135/71 | HR 57 | Temp 97.7°F | Resp 18 | Ht 62.0 in | Wt 149.9 lb

## 2022-03-20 DIAGNOSIS — R002 Palpitations: Secondary | ICD-10-CM | POA: Insufficient documentation

## 2022-03-20 DIAGNOSIS — I493 Ventricular premature depolarization: Secondary | ICD-10-CM | POA: Insufficient documentation

## 2022-03-20 DIAGNOSIS — I517 Cardiomegaly: Secondary | ICD-10-CM | POA: Diagnosis not present

## 2022-03-20 DIAGNOSIS — I10 Essential (primary) hypertension: Secondary | ICD-10-CM | POA: Insufficient documentation

## 2022-03-20 DIAGNOSIS — Z01812 Encounter for preprocedural laboratory examination: Secondary | ICD-10-CM | POA: Diagnosis not present

## 2022-03-20 DIAGNOSIS — I471 Supraventricular tachycardia: Secondary | ICD-10-CM | POA: Diagnosis not present

## 2022-03-20 DIAGNOSIS — Z01818 Encounter for other preprocedural examination: Secondary | ICD-10-CM

## 2022-03-20 HISTORY — DX: Personal history of other specified conditions: Z87.898

## 2022-03-20 HISTORY — DX: Essential (primary) hypertension: I10

## 2022-03-20 LAB — CBC
HCT: 41.7 % (ref 36.0–46.0)
Hemoglobin: 13.2 g/dL (ref 12.0–15.0)
MCH: 30.1 pg (ref 26.0–34.0)
MCHC: 31.7 g/dL (ref 30.0–36.0)
MCV: 95 fL (ref 80.0–100.0)
Platelets: 259 10*3/uL (ref 150–400)
RBC: 4.39 MIL/uL (ref 3.87–5.11)
RDW: 12.4 % (ref 11.5–15.5)
WBC: 6.4 10*3/uL (ref 4.0–10.5)
nRBC: 0 % (ref 0.0–0.2)

## 2022-03-20 LAB — BASIC METABOLIC PANEL
Anion gap: 11 (ref 5–15)
BUN: 7 mg/dL — ABNORMAL LOW (ref 8–23)
CO2: 23 mmol/L (ref 22–32)
Calcium: 9 mg/dL (ref 8.9–10.3)
Chloride: 103 mmol/L (ref 98–111)
Creatinine, Ser: 0.67 mg/dL (ref 0.44–1.00)
GFR, Estimated: >60 mL/min (ref >60)
Glucose, Bld: 84 mg/dL (ref 70–99)
Potassium: 4 mmol/L (ref 3.5–5.1)
Sodium: 137 mmol/L (ref 135–145)

## 2022-03-20 LAB — TYPE AND SCREEN
ABO/RH(D): A POS
Antibody Screen: NEGATIVE

## 2022-03-20 LAB — URINALYSIS, ROUTINE W REFLEX MICROSCOPIC
Bilirubin Urine: NEGATIVE
Glucose, UA: NEGATIVE mg/dL
Hgb urine dipstick: NEGATIVE
Ketones, ur: NEGATIVE mg/dL
Leukocytes,Ua: NEGATIVE
Nitrite: NEGATIVE
Protein, ur: NEGATIVE mg/dL
Specific Gravity, Urine: 1.013 (ref 1.005–1.030)
pH: 6 (ref 5.0–8.0)

## 2022-03-20 LAB — APTT: aPTT: 28 seconds (ref 24–36)

## 2022-03-20 NOTE — Progress Notes (Signed)
PCP - Dr. Redmond School Cardiologist - Dr. Rozann Lesches  PPM/ICD - denies   Chest x-ray - 05/10/21 EKG - 05/10/21 Stress Test - 08/25/14 ECHO - 08/25/14 Cardiac Cath - denies  Sleep Study - denies   DM- denies  ASA/Blood Thinner Instructions: F/u with surgeon regarding ASA and Brilinta   ERAS Protcol - yes, no drink   COVID TEST- n/a   Anesthesia review: yes, cardiac hx  Patient denies shortness of breath, fever, cough and chest pain at PAT appointment   All instructions explained to the patient, with a verbal understanding of the material. Patient agrees to go over the instructions while at home for a better understanding.  The opportunity to ask questions was provided.

## 2022-03-21 NOTE — Anesthesia Preprocedure Evaluation (Addendum)
Anesthesia Evaluation  Patient identified by MRN, date of birth, ID band Patient awake    Reviewed: Allergy & Precautions, NPO status , Patient's Chart, lab work & pertinent test results, reviewed documented beta blocker date and time   History of Anesthesia Complications (+) PONV, POST - OP SPINAL HEADACHE and history of anesthetic complications  Airway Mallampati: II  TM Distance: >3 FB Neck ROM: Limited   Comment: Posterior cervical fusion and foraminotomy Dental  (+) Dental Advisory Given, Partial Upper   Pulmonary asthma , former smoker,    Pulmonary exam normal breath sounds clear to auscultation       Cardiovascular hypertension, Pt. on home beta blockers Normal cardiovascular exam+ dysrhythmias Supra Ventricular Tachycardia  Rhythm:Regular Rate:Normal     Neuro/Psych  Headaches, Right ICA aneurysm    GI/Hepatic Neg liver ROS, GERD  ,  Endo/Other  negative endocrine ROS  Renal/GU negative Renal ROS     Musculoskeletal  (+) Arthritis ,   Abdominal   Peds  Hematology  (+) Blood dyscrasia (Brilinta), ,   Anesthesia Other Findings Day of surgery medications reviewed with the patient.  Reproductive/Obstetrics                            Anesthesia Physical Anesthesia Plan  ASA: 4  Anesthesia Plan: General   Post-op Pain Management: Tylenol PO (pre-op)*   Induction: Intravenous  PONV Risk Score and Plan: 4 or greater and Midazolam, Dexamethasone, Scopolamine patch - Pre-op and Ondansetron  Airway Management Planned: Oral ETT and Video Laryngoscope Planned  Additional Equipment: Arterial line  Intra-op Plan:   Post-operative Plan: Extubation in OR  Informed Consent: I have reviewed the patients History and Physical, chart, labs and discussed the procedure including the risks, benefits and alternatives for the proposed anesthesia with the patient or authorized representative who  has indicated his/her understanding and acceptance.     Dental advisory given  Plan Discussed with: CRNA  Anesthesia Plan Comments: (PAT note by Karoline Caldwell, PA-C:  Follows with cardiology for hx of palpitations. Recent monitor showed PAC's and PVC's with brief episodes of PSVT. When last seen by Bernerd Pho, PA-C on 01/18/22, pt reported still having palpitations, although she had not discontinue propranolol and started Toprol XL as previously recommended.  Patient also reported some DOE and intermittent chest pain that typically occurred at rest and was described as a "knot" in left pectoral region.  She was again recommended to start Toprol-XL.  Coronary CT was ordered.  Test was done on 02/09/2022 and showed coronary calcium score of 0, no evidence of CAD.  History of cervical spondylosis and previous C4-7 cervical fusion. 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   Preop labs reviewed, unremarkable.  Coronary CT 02/09/2022: IMPRESSION: 1. Coronary calcium score of 0.  2. Normal coronary origin with right dominance.  3. Normal coronary arteries.  RECOMMENDATIONS: 1. No evidence of CAD (0%). Consider non-atherosclerotic causes of chest pain.  Carotid duplex 11/16/2021: Summary:  Right Carotid: Velocities in the right ICA are consistent with a 1-39% stenosis.  Left Carotid: Velocities in the left ICA are consistent with a 1-39% stenosis.  Vertebrals: Bilateral vertebral arteries demonstrate antegrade flow.  Subclavians: Normal flow hemodynamics were seen in bilateral subclavian arteries.   Event monitor 10/06/2021: ZIO XT reviewed. 7 days, 14 hours analyzed. Predominant rhythm  is sinus with heart rate ranging from 52 bpm up to 131 bpm and average heart rate 67 bpm.  There were rare PACs including atrial couplets and triplets  representing less than 1% total beats. There were also rare PVCs representing less than 1% total beats.  Brief episodes of PSVT were noted, the longest of which lasted only 12 beats with average heart rate at 110 bpm. There were no sustained arrhythmias or pauses.  Patient triggered events did not correlate with any specific arrhythmia.  Nuclear stress 08/25/2014: IMPRESSION: 1. No reversible ischemia or infarction.  2. Normal left ventricular wall motion.  3. Left ventricular ejection fraction 55%  4. Low-risk stress test findings*.  TTE 08/25/2014: - Left ventricle: The cavity size was normal. Wall thickness was  increased in a pattern of mild LVH. Systolic function was normal.  The estimated ejection fraction was in the range of 60% to 65%.  Wall motion was normal; there were no regional wall motion  abnormalities. Findings consistent with left ventricular  diastolic dysfunction. There was no evidence of elevated  ventricular filling pressure by Doppler parameters.  )      Anesthesia Quick Evaluation

## 2022-03-21 NOTE — Progress Notes (Addendum)
Anesthesia Chart Review:  Follows with cardiology for hx of palpitations. Recent monitor showed PAC's and PVC's with brief episodes of PSVT. When last seen by Bernerd Pho, PA-C on 01/18/22, pt reported still having palpitations, although she had not discontinue propranolol and started Toprol XL as previously recommended.  Patient also reported some DOE and intermittent chest pain that typically occurred at rest and was described as a "knot" in left pectoral region.  She was again recommended to start Toprol-XL.  Coronary CT was ordered.  Test was done on 02/09/2022 and showed coronary calcium score of 0, no evidence of CAD.  History of cervical spondylosis and previous C4-7 cervical fusion.  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   Preop labs reviewed, unremarkable.  Coronary CT 02/09/2022: IMPRESSION: 1. Coronary calcium score of 0.   2. Normal coronary origin with right dominance.   3. Normal coronary arteries.   RECOMMENDATIONS: 1. No evidence of CAD (0%). Consider non-atherosclerotic causes of chest pain.  Carotid duplex 11/16/2021: Summary:  Right Carotid: Velocities in the right ICA are consistent with a 1-39% stenosis.  Left Carotid: Velocities in the left ICA are consistent with a 1-39% stenosis.  Vertebrals:  Bilateral vertebral arteries demonstrate antegrade flow.  Subclavians: Normal flow hemodynamics were seen in bilateral subclavian arteries.   Event monitor 10/06/2021: ZIO XT reviewed.  7 days, 14 hours analyzed.  Predominant rhythm is sinus with heart rate ranging from 52 bpm up to 131 bpm and average heart rate 67 bpm.   There were rare PACs including atrial couplets and triplets representing less than 1% total beats.  There were also rare PVCs representing less than 1% total beats.   Brief episodes of PSVT were noted, the  longest of which lasted only 12 beats with average heart rate at 110 bpm.  There were no sustained arrhythmias or pauses.   Patient triggered events did not correlate with any specific arrhythmia.  Nuclear stress 08/25/2014: IMPRESSION: 1. No reversible ischemia or infarction.   2. Normal left ventricular wall motion.   3. Left ventricular ejection fraction 55%   4. Low-risk stress test findings*.  TTE 08/25/2014: - Left ventricle: The cavity size was normal. Wall thickness was    increased in a pattern of mild LVH. Systolic function was normal.    The estimated ejection fraction was in the range of 60% to 65%.    Wall motion was normal; there were no regional wall motion    abnormalities. Findings consistent with left ventricular    diastolic dysfunction. There was no evidence of elevated    ventricular filling pressure by Doppler parameters.     Wynonia Musty [facility] Short Stay Center/Anesthesiology Phone [phone] 03/21/2022 1:31 PM

## 2022-03-24 ENCOUNTER — Inpatient Hospital Stay (HOSPITAL_COMMUNITY): Payer: Medicare HMO | Admitting: Physician Assistant

## 2022-03-24 ENCOUNTER — Inpatient Hospital Stay (HOSPITAL_COMMUNITY): Payer: Medicare HMO | Admitting: Anesthesiology

## 2022-03-24 ENCOUNTER — Inpatient Hospital Stay (HOSPITAL_COMMUNITY)
Admission: RE | Admit: 2022-03-24 | Discharge: 2022-03-25 | DRG: 027 | Disposition: A | Discharge status: Home / Self Care | Payer: Medicare HMO | Attending: Neurological Surgery | Admitting: Neurological Surgery

## 2022-03-24 ENCOUNTER — Encounter (HOSPITAL_COMMUNITY)
Admission: RE | Disposition: A | Discharge status: Home / Self Care | Payer: Self-pay | Source: Home / Self Care | Attending: Neurosurgery

## 2022-03-24 ENCOUNTER — Inpatient Hospital Stay (HOSPITAL_COMMUNITY)
Admission: RE | Admit: 2022-03-24 | Discharge: 2022-03-24 | Disposition: A | Discharge status: Home / Self Care | Payer: Medicare HMO | Source: Ambulatory Visit | Attending: Neurosurgery | Admitting: Neurosurgery

## 2022-03-24 DIAGNOSIS — I671 Cerebral aneurysm, nonruptured: Principal | ICD-10-CM | POA: Diagnosis present

## 2022-03-24 DIAGNOSIS — Z881 Allergy status to other antibiotic agents status: Secondary | ICD-10-CM

## 2022-03-24 DIAGNOSIS — Z87891 Personal history of nicotine dependence: Secondary | ICD-10-CM

## 2022-03-24 DIAGNOSIS — I10 Essential (primary) hypertension: Secondary | ICD-10-CM | POA: Diagnosis not present

## 2022-03-24 DIAGNOSIS — J45909 Unspecified asthma, uncomplicated: Secondary | ICD-10-CM

## 2022-03-24 DIAGNOSIS — G894 Chronic pain syndrome: Secondary | ICD-10-CM | POA: Diagnosis not present

## 2022-03-24 DIAGNOSIS — Z79899 Other long term (current) drug therapy: Secondary | ICD-10-CM

## 2022-03-24 DIAGNOSIS — K219 Gastro-esophageal reflux disease without esophagitis: Secondary | ICD-10-CM | POA: Diagnosis not present

## 2022-03-24 DIAGNOSIS — Z9049 Acquired absence of other specified parts of digestive tract: Secondary | ICD-10-CM

## 2022-03-24 DIAGNOSIS — Z7982 Long term (current) use of aspirin: Secondary | ICD-10-CM

## 2022-03-24 DIAGNOSIS — Z885 Allergy status to narcotic agent status: Secondary | ICD-10-CM

## 2022-03-24 DIAGNOSIS — M1990 Unspecified osteoarthritis, unspecified site: Secondary | ICD-10-CM | POA: Diagnosis present

## 2022-03-24 DIAGNOSIS — Z96651 Presence of right artificial knee joint: Secondary | ICD-10-CM | POA: Diagnosis present

## 2022-03-24 DIAGNOSIS — Z90710 Acquired absence of both cervix and uterus: Secondary | ICD-10-CM | POA: Diagnosis not present

## 2022-03-24 DIAGNOSIS — Z981 Arthrodesis status: Secondary | ICD-10-CM | POA: Diagnosis not present

## 2022-03-24 HISTORY — PX: RADIOLOGY WITH ANESTHESIA: SHX6223

## 2022-03-24 HISTORY — PX: IR TRANSCATH/EMBOLIZ: IMG695

## 2022-03-24 LAB — SURGICAL PCR SCREEN
MRSA, PCR: NEGATIVE
Staphylococcus aureus: NEGATIVE

## 2022-03-24 SURGERY — IR WITH ANESTHESIA
Anesthesia: General | Laterality: Right

## 2022-03-24 MED ORDER — CLEVIDIPINE BUTYRATE 0.5 MG/ML IV EMUL
INTRAVENOUS | Status: DC | PRN
  Administered 2022-03-24: 2 mg/h via INTRAVENOUS

## 2022-03-24 MED ORDER — ALBUTEROL SULFATE HFA 108 (90 BASE) MCG/ACT IN AERS: 2.0000 | INHALATION_SPRAY | Freq: Four times a day (QID) | RESPIRATORY_TRACT | Status: DC | PRN

## 2022-03-24 MED ORDER — SODIUM CHLORIDE 0.9 % IV SOLN: INTRAVENOUS | Status: DC | PRN

## 2022-03-24 MED ORDER — LIDOCAINE 2% (20 MG/ML) 5 ML SYRINGE
INTRAMUSCULAR | Status: DC | PRN
  Administered 2022-03-24: 100 mg via INTRAVENOUS

## 2022-03-24 MED ORDER — OXYCODONE-ACETAMINOPHEN 10-325 MG PO TABS
1.0000 | ORAL_TABLET | ORAL | Status: DC | PRN
Start: 2022-03-24 — End: 2022-03-24

## 2022-03-24 MED ORDER — CHLORHEXIDINE GLUCONATE CLOTH 2 % EX PADS: 6.0000 | MEDICATED_PAD | Freq: Once | CUTANEOUS | Status: DC

## 2022-03-24 MED ORDER — IOHEXOL 300 MG/ML  SOLN
100.0000 mL | Freq: Once | INTRAMUSCULAR | Status: AC | PRN
  Administered 2022-03-24: 60 mL via INTRA_ARTERIAL

## 2022-03-24 MED ORDER — ROCURONIUM BROMIDE 10 MG/ML (PF) SYRINGE
PREFILLED_SYRINGE | INTRAVENOUS | Status: DC | PRN
  Administered 2022-03-24: 70 mg via INTRAVENOUS

## 2022-03-24 MED ORDER — METOPROLOL SUCCINATE ER 25 MG PO TB24
12.5000 mg | ORAL_TABLET | Freq: Every day | ORAL | Status: DC
  Administered 2022-03-25: 12.5 mg via ORAL
  Filled 2022-03-24: qty 1

## 2022-03-24 MED ORDER — FENTANYL CITRATE (PF) 100 MCG/2ML IJ SOLN
INTRAMUSCULAR | Status: AC
  Administered 2022-03-24: 50 ug via INTRAVENOUS
  Filled 2022-03-24: qty 2

## 2022-03-24 MED ORDER — ESTROGENS CONJUGATED 0.625 MG PO TABS
0.6250 mg | ORAL_TABLET | Freq: Every day | ORAL | Status: DC
  Administered 2022-03-25: 0.625 mg via ORAL
  Filled 2022-03-24: qty 1

## 2022-03-24 MED ORDER — EPHEDRINE SULFATE-NACL 50-0.9 MG/10ML-% IV SOSY
PREFILLED_SYRINGE | INTRAVENOUS | Status: DC | PRN
  Administered 2022-03-24: 5 mg via INTRAVENOUS

## 2022-03-24 MED ORDER — LACTATED RINGERS IV SOLN: INTRAVENOUS | Status: DC

## 2022-03-24 MED ORDER — AMISULPRIDE (ANTIEMETIC) 5 MG/2ML IV SOLN
INTRAVENOUS | Status: AC
  Filled 2022-03-24: qty 4

## 2022-03-24 MED ORDER — PROMETHAZINE HCL 25 MG PO TABS: 25.0000 mg | ORAL_TABLET | Freq: Four times a day (QID) | ORAL | Status: DC | PRN

## 2022-03-24 MED ORDER — ACETAMINOPHEN 500 MG PO TABS
1000.0000 mg | ORAL_TABLET | Freq: Once | ORAL | Status: AC
  Administered 2022-03-24: 1000 mg via ORAL
  Filled 2022-03-24: qty 2

## 2022-03-24 MED ORDER — PHENYLEPHRINE HCL-NACL 20-0.9 MG/250ML-% IV SOLN
INTRAVENOUS | Status: DC | PRN
  Administered 2022-03-24: 40 ug/min via INTRAVENOUS

## 2022-03-24 MED ORDER — ONDANSETRON HCL 4 MG PO TABS: 4.0000 mg | ORAL_TABLET | ORAL | Status: DC | PRN

## 2022-03-24 MED ORDER — CEFAZOLIN SODIUM-DEXTROSE 2-4 GM/100ML-% IV SOLN
2.0000 g | INTRAVENOUS | Status: DC
  Filled 2022-03-24: qty 100

## 2022-03-24 MED ORDER — LABETALOL HCL 5 MG/ML IV SOLN
10.0000 mg | INTRAVENOUS | Status: DC | PRN
  Administered 2022-03-24: 20 mg via INTRAVENOUS
  Filled 2022-03-24: qty 8

## 2022-03-24 MED ORDER — OXYCODONE HCL 5 MG PO TABS: 5.0000 mg | ORAL_TABLET | ORAL | Status: DC | PRN

## 2022-03-24 MED ORDER — ORAL CARE MOUTH RINSE: 15.0000 mL | Freq: Once | OROMUCOSAL | Status: AC

## 2022-03-24 MED ORDER — PROPOFOL 10 MG/ML IV BOLUS
INTRAVENOUS | Status: DC | PRN
  Administered 2022-03-24: 150 mg via INTRAVENOUS

## 2022-03-24 MED ORDER — FENTANYL CITRATE (PF) 250 MCG/5ML IJ SOLN
INTRAMUSCULAR | Status: AC
  Filled 2022-03-24: qty 5

## 2022-03-24 MED ORDER — FENTANYL CITRATE (PF) 100 MCG/2ML IJ SOLN
25.0000 ug | INTRAMUSCULAR | Status: DC | PRN
  Administered 2022-03-24: 25 ug via INTRAVENOUS

## 2022-03-24 MED ORDER — SODIUM CHLORIDE 0.9 % IV SOLN: INTRAVENOUS | Status: DC

## 2022-03-24 MED ORDER — VITAMIN D 25 MCG (1000 UNIT) PO TABS
1000.0000 [IU] | ORAL_TABLET | Freq: Every day | ORAL | Status: DC
  Administered 2022-03-25: 1000 [IU] via ORAL
  Filled 2022-03-24: qty 1

## 2022-03-24 MED ORDER — SCOPOLAMINE 1 MG/3DAYS TD PT72
MEDICATED_PATCH | TRANSDERMAL | Status: DC | PRN
  Administered 2022-03-24: 1 via TRANSDERMAL

## 2022-03-24 MED ORDER — ONDANSETRON HCL 4 MG/2ML IJ SOLN: 4.0000 mg | INTRAMUSCULAR | Status: DC | PRN

## 2022-03-24 MED ORDER — GABAPENTIN 100 MG PO CAPS: 100.0000 mg | ORAL_CAPSULE | Freq: Three times a day (TID) | ORAL | Status: DC | PRN

## 2022-03-24 MED ORDER — HEPARIN SODIUM (PORCINE) 1000 UNIT/ML IJ SOLN
INTRAMUSCULAR | Status: DC | PRN
  Administered 2022-03-24: 5000 [IU] via INTRAVENOUS

## 2022-03-24 MED ORDER — CLEVIDIPINE BUTYRATE 0.5 MG/ML IV EMUL
INTRAVENOUS | Status: AC
  Filled 2022-03-24: qty 50

## 2022-03-24 MED ORDER — CYCLOBENZAPRINE HCL 10 MG PO TABS
10.0000 mg | ORAL_TABLET | Freq: Two times a day (BID) | ORAL | Status: DC | PRN
Start: 2022-03-24 — End: 2022-03-25

## 2022-03-24 MED ORDER — LINACLOTIDE 145 MCG PO CAPS
290.0000 ug | ORAL_CAPSULE | Freq: Every day | ORAL | Status: DC
  Administered 2022-03-25: 290 ug via ORAL
  Filled 2022-03-24: qty 2

## 2022-03-24 MED ORDER — FENTANYL CITRATE (PF) 250 MCG/5ML IJ SOLN
INTRAMUSCULAR | Status: DC | PRN
Start: 2022-03-24 — End: 2022-03-24
  Administered 2022-03-24: 100 ug via INTRAVENOUS

## 2022-03-24 MED ORDER — FUROSEMIDE 20 MG PO TABS: 20.0000 mg | ORAL_TABLET | Freq: Every day | ORAL | Status: DC | PRN

## 2022-03-24 MED ORDER — TICAGRELOR 90 MG PO TABS
90.0000 mg | ORAL_TABLET | Freq: Two times a day (BID) | ORAL | Status: DC
  Administered 2022-03-24 – 2022-03-25 (×2): 90 mg via ORAL
  Filled 2022-03-24 (×2): qty 1

## 2022-03-24 MED ORDER — ASPIRIN 81 MG PO TBEC
81.0000 mg | DELAYED_RELEASE_TABLET | Freq: Every day | ORAL | Status: DC
  Administered 2022-03-25: 81 mg via ORAL
  Filled 2022-03-24: qty 1

## 2022-03-24 MED ORDER — ONDANSETRON HCL 4 MG/2ML IJ SOLN: 4.0000 mg | Freq: Once | INTRAMUSCULAR | Status: DC | PRN

## 2022-03-24 MED ORDER — OXYCODONE-ACETAMINOPHEN 5-325 MG PO TABS
1.0000 | ORAL_TABLET | ORAL | Status: DC | PRN
  Administered 2022-03-24 – 2022-03-25 (×2): 1 via ORAL
  Filled 2022-03-24 (×2): qty 1

## 2022-03-24 MED ORDER — SUGAMMADEX SODIUM 200 MG/2ML IV SOLN
INTRAVENOUS | Status: DC | PRN
  Administered 2022-03-24: 200 mg via INTRAVENOUS

## 2022-03-24 MED ORDER — SODIUM CHLORIDE 0.9 % IV SOLN
INTRAVENOUS | Status: DC
Start: 2022-03-24 — End: 2022-03-25

## 2022-03-24 MED ORDER — DEXAMETHASONE SODIUM PHOSPHATE 10 MG/ML IJ SOLN
INTRAMUSCULAR | Status: DC | PRN
  Administered 2022-03-24: 5 mg via INTRAVENOUS

## 2022-03-24 MED ORDER — ONDANSETRON HCL 4 MG/2ML IJ SOLN
INTRAMUSCULAR | Status: DC | PRN
  Administered 2022-03-24: 4 mg via INTRAVENOUS

## 2022-03-24 MED ORDER — CHLORHEXIDINE GLUCONATE 0.12 % MT SOLN
15.0000 mL | Freq: Once | OROMUCOSAL | Status: AC
  Administered 2022-03-24: 15 mL via OROMUCOSAL
  Filled 2022-03-24: qty 15

## 2022-03-24 MED ORDER — AMISULPRIDE (ANTIEMETIC) 5 MG/2ML IV SOLN: 5.0000 mg | Freq: Once | INTRAVENOUS | Status: DC

## 2022-03-24 MED ORDER — PANTOPRAZOLE SODIUM 40 MG PO TBEC
40.0000 mg | DELAYED_RELEASE_TABLET | Freq: Two times a day (BID) | ORAL | Status: DC
  Administered 2022-03-24 – 2022-03-25 (×2): 40 mg via ORAL
  Filled 2022-03-24 (×2): qty 1

## 2022-03-24 NOTE — Anesthesia Procedure Notes (Signed)
Procedure Name: Intubation Date/Time: 03/24/2022 2:55 PM  Performed by: Reece Agar, CRNAPre-anesthesia Checklist: Patient identified, Emergency Drugs available, Suction available and Patient being monitored Patient Re-evaluated:Patient Re-evaluated prior to induction Oxygen Delivery Method: Circle System Utilized Preoxygenation: Pre-oxygenation with 100% oxygen Induction Type: IV induction Ventilation: Mask ventilation without difficulty Laryngoscope Size: Mac and 3 Grade View: Grade I Tube type: Oral Tube size: 7.0 mm Number of attempts: 1 Airway Equipment and Method: Stylet Placement Confirmation: ETT inserted through vocal cords under direct vision, positive ETCO2 and breath sounds checked- equal and bilateral Secured at: 21 cm Tube secured with: Tape Dental Injury: Teeth and Oropharynx as per pre-operative assessment

## 2022-03-24 NOTE — Progress Notes (Signed)
Report given to [place]. Groin site with dressing dry and intact. Pulses present

## 2022-03-24 NOTE — Brief Op Note (Signed)
  NEUROSURGERY BRIEF OPERATIVE  NOTE   PREOP DX: Right ICA aneurysm  POSTOP DX: Same  PROCEDURE: Pipeline embolization of RICA aneurysm  SURGEON: Dr. Consuella Lose, MD  ANESTHESIA: GETA  APPROACH: Right trans-femoral  EBL: Minimal  SPECIMENS: None  COMPLICATIONS: None  CONDITION: Stable to recovery  FINDINGS (Full report in [facility]): 1. Successful Pipeline embolization of right ICA aneurysm   Consuella Lose, MD [facility]

## 2022-03-24 NOTE — Anesthesia Procedure Notes (Signed)
Arterial Line Insertion Start/End7/14/2023 12:00 PM, 03/24/2022 12:07 PM Performed by: Santa Lighter, MD, anesthesiologist  Patient location: Pre-op. Preanesthetic checklist: patient identified, IV checked, site marked, risks and benefits discussed, surgical consent, monitors and equipment checked, pre-op evaluation, timeout performed and anesthesia consent Lidocaine 1% used for infiltration Right, radial was placed Catheter size: 20 G Hand hygiene performed  and maximum sterile barriers used   Attempts: 1 Procedure performed using ultrasound guided technique. Ultrasound Notes:anatomy identified, needle tip was noted to be adjacent to the nerve/plexus identified, no ultrasound evidence of intravascular and/or intraneural injection and image(s) printed for medical record Following insertion, dressing applied and Biopatch. Post procedure assessment: normal and unchanged  Post procedure complications: second provider assisted. Patient tolerated the procedure well with no immediate complications.

## 2022-03-24 NOTE — Transfer of Care (Signed)
Immediate Anesthesia Transfer of Care Note  Patient: Gina Wolfe  Procedure(s) Performed: Pipeline Embolization of Right ICA Aneurysm (Right)  Patient Location: PACU  Anesthesia Type:General  Level of Consciousness: awake and alert   Airway & Oxygen Therapy: Patient Spontanous Breathing and Patient connected to face mask oxygen  Post-op Assessment: Report given to RN and Post -op Vital signs reviewed and stable  Post vital signs: Reviewed and stable  Last Vitals:  Vitals Value Taken Time  BP 119/66 03/24/22 1745  Temp    Pulse 78 03/24/22 1749  Resp 12 03/24/22 1749  SpO2 95 % 03/24/22 1749  Vitals shown include unvalidated device data.  Last Pain:  Vitals:   03/24/22 1030  TempSrc:   PainSc: 5       Patients Stated Pain Goal: 3 (41/03/01 3143)  Complications: No notable events documented.

## 2022-03-24 NOTE — H&P (Signed)
Chief Complaint   Aneurysm  History of Present Illness  Gina Wolfe is a 66 y.o. female with a history of cervical spondylosis and previous neck surgery.  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  She has undergone diagnostic cerebral angiogram for further characterization of the aneurysm.  After lengthy discussion regarding treatment options, the patient elected to proceed with pipeline embolization.  She has been pretreated with aspirin and Brilinta, including a dose this morning.  She does not report any excessive bruising or spontaneous bleeding.  Past Medical History   Past Medical History:  Diagnosis Date   Arthritis    Asthma    Chronic pain syndrome    Diverticulitis    GERD (gastroesophageal reflux disease)    History of palpitations    Hypertension    pt states she has never had high BP   IBS (irritable bowel syndrome)    PONV (postoperative nausea and vomiting)    Spinal headache     Past Surgical History   Past Surgical History:  Procedure Laterality Date   ABDOMINAL HYSTERECTOMY     BACK SURGERY     x3 previous lumbar, x3 cervical surgeries   CHOLECYSTECTOMY     COLONOSCOPY  05/20/10   TDV:VOHYWVPX hemorrhoids otherwise normal/EGD:distal esophageal erosions otherwise normal   COLONOSCOPY WITH PROPOFOL N/A 08/15/2021   Procedure: COLONOSCOPY WITH PROPOFOL;  Surgeon: Daneil Dolin, MD;  [facility];  Service: Endoscopy;  Laterality: N/A;  7:30am   CYST EXCISION Right    Eye cyst   EYE SURGERY Right    removed a cyst   IR ANGIO INTRA EXTRACRAN SEL INTERNAL CAROTID BILAT MOD SED  02/21/2022   IR ANGIO VERTEBRAL SEL VERTEBRAL UNI R MOD SED  02/21/2022   LUMBAR LAMINECTOMY/DECOMPRESSION MICRODISCECTOMY N/A 07/13/2016   Procedure: DISCECTOMY Lumbar four-five;  Surgeon: Newman Pies, MD;  Location: [place];   Service: Neurosurgery;  Laterality: N/A;   MENISCUS REPAIR  2007   R knee    POSTERIOR CERVICAL FUSION/FORAMINOTOMY N/A 04/10/2013   Procedure: POSTERIOR CERVICAL FUSION/FORAMINOTOMY LEVEL 2;  Surgeon: Sinclair Ship, MD;  Location: [place];  Service: Orthopedics;  Laterality: N/A;  Posterior cervical decompression fusion, cervical 5-6, cervical 6-7 with instrumentation and allograft.   TOTAL KNEE ARTHROPLASTY Right 01/03/2021   Procedure: TOTAL KNEE ARTHROPLASTY;  Surgeon: Gaynelle Arabian, MD;  Location: [facility];  Service: Orthopedics;  Laterality: Right;  74mn   TUBAL LIGATION      Social History   Social History   Tobacco Use   Smoking status: Former    Types: Cigarettes    Quit date: 08/25/2007    Years since quitting: 14.5   Smokeless tobacco: Never  Vaping Use   Vaping Use: Never used  Substance Use Topics   Alcohol use: No    Alcohol/week: 0.0 standard drinks of alcohol   Drug use: No    Medications   Prior to Admission medications   Medication Sig Start Date End Date Taking? Authorizing Provider  albuterol (PROVENTIL HFA;VENTOLIN HFA) 108 (90 Base) MCG/ACT inhaler Inhale 2 puffs into the lungs every 6 (six) hours as needed for wheezing or shortness of breath.    Yes [provider]  aspirin EC 81 MG tablet Take 81 mg by mouth daily. Swallow whole.   Yes [provider]  cholecalciferol (VITAMIN D3) 25 MCG (1000 UNIT) tablet  Take 1,000 Units by mouth daily.   Yes [provider]  cyclobenzaprine (FLEXERIL) 10 MG tablet Take 1 tablet (10 mg total) by mouth 2 (two) times daily as needed for muscle spasms. Patient taking differently: Take 10 mg by mouth 3 (three) times daily as needed for muscle spasms. 06/15/21  Yes Marcello Fennel, PA-C  estrogens, conjugated, (PREMARIN) 0.625 MG tablet Take 0.625 mg by mouth daily with breakfast.    Yes [provider]  furosemide (LASIX) 20 MG tablet Take 20 mg by mouth daily as needed for edema.     Yes [provider]  gabapentin (NEURONTIN) 100 MG capsule Take 100 mg by mouth 3 (three) times daily. 11/25/21  Yes [provider]  linaclotide (LINZESS) 290 MCG CAPS capsule Take 290 mcg by mouth daily.   Yes [provider]  metoprolol succinate (TOPROL XL) 25 MG 24 hr tablet Take 0.5 tablets (12.5 mg total) by mouth daily. 01/18/22  Yes Strader, Fransisco Hertz, PA-C  oxyCODONE-acetaminophen (PERCOCET) 10-325 MG tablet Take 1 tablet by mouth every 4 (four) hours as needed for pain. 05/02/21  Yes [provider]  pantoprazole (PROTONIX) 40 MG tablet Take 40 mg by mouth daily.   Yes [provider]  promethazine (PHENERGAN) 25 MG tablet Take 25 mg by mouth every 6 (six) hours as needed for nausea or vomiting.   Yes [provider]    Allergies   Allergies  Allergen Reactions   Clindamycin Hcl Anaphylaxis   Codeine Nausea Only    Review of Systems  ROS  Neurologic Exam  Awake, alert, oriented Memory and concentration grossly intact Speech fluent, appropriate CN grossly intact Motor exam: Upper Extremities Deltoid Bicep Tricep Grip  Right 5/5 5/5 5/5 5/5  Left 5/5 5/5 5/5 5/5   Lower Extremities IP Quad PF DF EHL  Right 5/5 5/5 5/5 5/5 5/5  Left 5/5 5/5 5/5 5/5 5/5   Sensation grossly intact to LT  Imaging  Diagnostic cerebral angiogram again reviewed and demonstrates an approximately 3 to 4 mm right carotid aneurysm  Impression  - 66 y.o. female with incidental discovery of small right carotid aneurysm.  Although expectant observation was initially recommended, the patient wished to proceed with treatment.  She has been pretreated with DAPT.  Plan  -We will plan on proceeding with pipeline embolization of the right carotid aneurysm   I have reviewed the details of the procedure as well as the expected postprocedural course and recovery at length with the patient and her family in the office.  We have also discussed the  associated risks, benefits, and alternatives to the procedure.  All her questions today were answered and she provided informed consent to proceed.  Consuella Lose, MD [facility]

## 2022-03-25 MED ORDER — CHLORHEXIDINE GLUCONATE CLOTH 2 % EX PADS
6.0000 | MEDICATED_PAD | Freq: Every day | CUTANEOUS | Status: DC
Start: 2022-03-25 — End: 2022-03-25

## 2022-03-25 NOTE — Anesthesia Postprocedure Evaluation (Signed)
Anesthesia Post Note  Patient: Shimika Ames Fielder  Procedure(s) Performed: Pipeline Embolization of Right ICA Aneurysm (Right)     Patient location during evaluation: PACU Anesthesia Type: General Level of consciousness: awake and alert Pain management: pain level controlled Vital Signs Assessment: post-procedure vital signs reviewed and stable Respiratory status: spontaneous breathing, nonlabored ventilation, respiratory function stable and patient connected to nasal cannula oxygen Cardiovascular status: blood pressure returned to baseline and stable Postop Assessment: no apparent nausea or vomiting Anesthetic complications: no   No notable events documented.  Last Vitals:  Vitals:   03/25/22 0700 03/25/22 0800  BP: (!) 93/51 (!) 104/53  Pulse: (!) 59 66  Resp: 11 10  Temp:  36.6 C  SpO2: 96% 98%    Last Pain:  Vitals:   03/25/22 0800  TempSrc: Oral  PainSc: 0-No pain                 Willy Vorce S

## 2022-03-25 NOTE — Progress Notes (Signed)
  Transition of Care (TOC) Screening Note   Patient Details  Name: Gina Wolfe Date of Birth: [birth year]   Transition of Care [facility]) CM/SW Contact:    Alfredia Ferguson, LCSW Phone Number: 03/25/2022, 8:07 AM    Transition of Care Department [facility]) has reviewed patient and noted no immediate TOC needs pending continued medical work-up. TOC team will continue to monitor patient advancement through interdisciplinary progression rounds to support any identified discharge supports as needed. If new patient transition needs arise, please place a TOC consult or reach out to [facility] team.

## 2022-03-25 NOTE — Discharge Summary (Signed)
Physician Discharge Summary  Patient ID: Gina Wolfe MRN: [id number] DOB/AGE: 64-Jun-1957 64 y.o.  Admit date: 03/24/2022 Discharge date: 03/25/2022  Admission Diagnoses:  Right ICA aneurysm   Discharge Diagnoses: same   Discharged Condition: good  Hospital Course: The patient was admitted on 03/24/2022 and taken to the operating room where the patient underwent pipeline embolization of RICA aneurysm. The patient tolerated the procedure well and was taken to the recovery room and then to the ICU in stable condition. The hospital course was routine. There were no complications. The wound remained clean dry and intact. Pt had appropriate head soreness. No complaints of new pain or new N/T/W. The patient remained afebrile with stable vital signs, and tolerated a regular diet. The patient continued to increase activities, and pain was well controlled with oral pain medications.   Consults: None  Significant Diagnostic Studies:  Results for orders placed or performed during the hospital encounter of 03/24/22  Surgical pcr screen   Specimen: Nasal Mucosa; Nasal Swab  Result Value Ref Range   MRSA, PCR NEGATIVE NEGATIVE   Staphylococcus aureus NEGATIVE NEGATIVE    IR Transcath/Emboliz  Result Date: 03/24/2022 PROCEDURE: PIPELINE EMBOLIZATION OF RIGHT INTERNAL CAROTID ARTERY ANEURYSM HISTORY: The patient is a 66 year old woman with incidentally discovered right internal carotid artery aneurysms. After a lengthy discussion in the office, the patient did wish to proceed with treatment. She had previously undergone diagnostic cerebral angiogram which further delineated the aneurysm. After a review of treatment options she elected to proceed with pipeline embolization. She has been on 7 days of aspirin and Brilinta. ACCESS: The technical aspects of the procedure as well as its potential risks and benefits were reviewed with the patient and the patient's family. These risks included but were not  limited to stroke, intracranial hemorrhage, bleeding, infection, allergic reaction, damage to organs or vital structures, stroke, non-diagnostic procedure, and the catastrophic outcomes of heart attack, coma, and death. With an understanding of these risks, informed consent was obtained and witnessed. The patient was placed in the supine position on the angiography table and the skin of right groin prepped in the usual sterile fashion. The procedure was performed under general anesthesia. A 8- French sheath was introduced in the right common femoral artery using Seldinger technique. MEDICATIONS: HEPARIN: 5000 Units total. CONTRAST:  cc, Omnipaque 300 FLUOROSCOPY TIME:  FLUOROSCOPY TIME: See IR records TECHNIQUE: CATHETERS AND WIRES 180 cm 0.035" glidewire 6-French 80cm NeuronMax guide sheath 6-French Berenstein Select JB-1 catheter 0.058" CatV guidecatheter 150 cm Phenom microcatheter Synchro 2 select microwire PIPELINE DEVICE USED 4.0 x 33m Pipeline Shield VESSELS CATHETERIZED Right internal carotid Right middle cerebral artery VESSELS STUDIED Right internal carotid artery, head (pre-embolization) Right internal carotid artery, head (post-embolization) Right internal carotid artery, head (final control) Right common femoral PROCEDURAL NARRATIVE Under real-time fluoroscopy, the guide sheath was advanced over the select catheter and glidewire into the descending aorta. The select catheter was then advanced into the mid cervical right internal carotid artery. The guide sheath was then advanced into the origin of the cervical right internal carotid artery. The Select catheter was removed and the 058 guide catheter was coaxially introduced over the microcatheter and microwire. The microcatheter was then advanced under roadmap guidance into the mid M2 segment. The guide catheter was then tracked over the microcatheter to its final position in the distal cavernous internal carotid artery. The microwire was then removed  and the above pipeline device was introduced. Under roadmap guidance, the pipeline device was deployed  just proximal to the origin of the posterior communicating artery. During deployment, the CAT 5 guide catheter was withdrawn down into the petrous segment, and the remainder of the pipeline device was deployed. The pushing wire was then recaptured with the microcatheter. Immediate post embolization angiogram was taken. There appeared to be good proximal vessel wall apposition. The microcatheter was therefore removed. Delayed post deployment angiogram was taken. The guide catheter was then withdrawn into the cervical internal carotid artery. Final control angiography was then performed from the guide catheter. The guide catheter and guide sheath were then synchronously removed without incident. FINDINGS: Right internal carotid artery, head (pre-embolization): Injection reveals the presence of a widely patent ICA, M1, and A1 segments and their branches. The previously described medially and inferiorly projecting supraclinoid aneurysm is again seen. Again noted is a fetal type posterior cerebral artery. The parenchymal and venous phases are normal. The venous sinuses are widely patent. Right internal carotid artery, head (post-embolization): Injection taken immediately and in a delayed fashion after deployment of the pipeline device reveal the presence of a widely patent ICA that leads to a patent ACA and MCA. Pipeline device appears to be in satisfactory position with good vessel wall apposition. There are no filling defects seen. There is no significant contrast stasis within the aneurysm. Right internal carotid artery, head (final control): Injection reveals the presence of a widely patent ICA that leads to a patent ACA and MCA. The fetal type posterior cerebral artery remains widely patent. No thrombus is visualized. Pipeline device is in stable position with no significant contrast stasis within the aneurysm. The  parenchymal and venous phases are unremarkable. Right femoral: Normal vessel. No significant atherosclerotic disease. Arterial sheath in adequate position. DISPOSITION: Upon completion of the study, the femoral sheath was removed and hemostasis obtained using a 7-Fr ExoSeal closure device. Good proximal and distal lower extremity pulses were documented upon achievement of hemostasis. The procedure was well tolerated and no early complications were observed. The patient was transferred to the postanesthesia care unit in stable hemodynamic condition. IMPRESSION: 1. Successful pipeline embolization of a supraclinoid right internal carotid artery aneurysm. No immediate local thrombotic or distal embolic complications were noted. The preliminary results of this procedure were shared with the patient's family. Electronically Signed   By: Consuella Lose   On: 03/24/2022 16:43    Antibiotics:  Anti-infectives (From admission, onward)    Start     Dose/Rate Route Frequency Ordered Stop   03/24/22 1000  ceFAZolin (ANCEF) IVPB 2g/100 mL premix  Status:  Discontinued        2 g 200 mL/hr over 30 Minutes Intravenous On call to O.R. 03/24/22 0949 03/24/22 1857       Discharge Exam: Blood pressure (!) 104/53, pulse 66, temperature (!) 97.5 F (36.4 C), temperature source Oral, resp. rate 10, height '5\' 2"'$  (1.575 m), weight 68 kg, SpO2 98 %. Neurologic: Grossly normal Ambulating and voiding well  Discharge Medications:   Allergies as of 03/25/2022       Reactions   Clindamycin Hcl Anaphylaxis   Codeine Nausea Only        Medication List     TAKE these medications    albuterol 108 (90 Base) MCG/ACT inhaler Commonly known as: VENTOLIN HFA Inhale 2 puffs into the lungs every 6 (six) hours as needed for wheezing or shortness of breath.   aspirin EC 81 MG tablet Take 81 mg by mouth daily. Swallow whole.   Brilinta 90 MG Tabs tablet  Generic drug: ticagrelor Take 90 mg by mouth daily.    cholecalciferol 25 MCG (1000 UNIT) tablet Commonly known as: VITAMIN D3 Take 1,000 Units by mouth daily.   cyclobenzaprine 10 MG tablet Commonly known as: FLEXERIL Take 1 tablet (10 mg total) by mouth 2 (two) times daily as needed for muscle spasms. What changed: when to take this   estrogens (conjugated) 0.625 MG tablet Commonly known as: PREMARIN Take 0.625 mg by mouth daily with breakfast.   furosemide 20 MG tablet Commonly known as: LASIX Take 20 mg by mouth daily as needed for edema.   gabapentin 100 MG capsule Commonly known as: NEURONTIN Take 100 mg by mouth 3 (three) times daily as needed (Nerve).   linaclotide 290 MCG Caps capsule Commonly known as: LINZESS Take 290 mcg by mouth daily.   metoprolol succinate 25 MG 24 hr tablet Commonly known as: Toprol XL Take 0.5 tablets (12.5 mg total) by mouth daily.   oxyCODONE-acetaminophen 10-325 MG tablet Commonly known as: PERCOCET Take 1 tablet by mouth every 4 (four) hours as needed for pain.   pantoprazole 40 MG tablet Commonly known as: PROTONIX Take 40 mg by mouth 2 (two) times daily.   promethazine 25 MG tablet Commonly known as: PHENERGAN Take 25 mg by mouth every 6 (six) hours as needed for nausea or vomiting.        Disposition: home   Final Dx: pipeline embolization of RICA aneurysm  Discharge Instructions     Diet - low sodium heart healthy   Complete by: As directed    Increase activity slowly   Complete by: As directed           Signed: Ocie Cornfield Talishia Betzler 03/25/2022, 8:25 AM

## 2022-03-27 ENCOUNTER — Encounter (HOSPITAL_COMMUNITY): Payer: Self-pay

## 2022-03-27 ENCOUNTER — Encounter (HOSPITAL_COMMUNITY): Payer: Medicare HMO | Admitting: Physical Therapy

## 2022-03-27 ENCOUNTER — Other Ambulatory Visit (HOSPITAL_COMMUNITY): Payer: Self-pay | Admitting: Neurosurgery

## 2022-03-27 DIAGNOSIS — I671 Cerebral aneurysm, nonruptured: Secondary | ICD-10-CM

## 2022-03-27 HISTORY — PX: IR NEURO EACH ADD'L AFTER BASIC UNI RIGHT (MS): IMG5374

## 2022-03-27 HISTORY — PX: IR ANGIO INTRA EXTRACRAN SEL INTERNAL CAROTID UNI R MOD SED: IMG5362

## 2022-03-27 HISTORY — PX: IR ANGIOGRAM FOLLOW UP STUDY: IMG697

## 2022-04-03 ENCOUNTER — Encounter (HOSPITAL_COMMUNITY): Payer: Medicare HMO | Admitting: Physical Therapy

## 2022-04-10 DIAGNOSIS — I671 Cerebral aneurysm, nonruptured: Secondary | ICD-10-CM | POA: Diagnosis not present

## 2022-04-10 DIAGNOSIS — Z6827 Body mass index (BMI) 27.0-27.9, adult: Secondary | ICD-10-CM | POA: Diagnosis not present

## 2022-04-21 DIAGNOSIS — I671 Cerebral aneurysm, nonruptured: Secondary | ICD-10-CM | POA: Diagnosis not present

## 2022-04-21 DIAGNOSIS — Z6827 Body mass index (BMI) 27.0-27.9, adult: Secondary | ICD-10-CM | POA: Diagnosis not present

## 2022-04-21 DIAGNOSIS — M5136 Other intervertebral disc degeneration, lumbar region: Secondary | ICD-10-CM | POA: Diagnosis not present

## 2022-04-21 DIAGNOSIS — M5030 Other cervical disc degeneration, unspecified cervical region: Secondary | ICD-10-CM | POA: Diagnosis not present

## 2022-04-21 DIAGNOSIS — G40101 Localization-related (focal) (partial) symptomatic epilepsy and epileptic syndromes with simple partial seizures, not intractable, with status epilepticus: Secondary | ICD-10-CM | POA: Diagnosis not present

## 2022-04-21 DIAGNOSIS — E2839 Other primary ovarian failure: Secondary | ICD-10-CM | POA: Diagnosis not present

## 2022-04-21 DIAGNOSIS — N951 Menopausal and female climacteric states: Secondary | ICD-10-CM | POA: Diagnosis not present

## 2022-05-11 DIAGNOSIS — G894 Chronic pain syndrome: Secondary | ICD-10-CM | POA: Diagnosis not present

## 2022-05-11 DIAGNOSIS — M159 Polyosteoarthritis, unspecified: Secondary | ICD-10-CM | POA: Diagnosis not present

## 2022-05-11 DIAGNOSIS — M5136 Other intervertebral disc degeneration, lumbar region: Secondary | ICD-10-CM | POA: Diagnosis not present

## 2022-05-11 DIAGNOSIS — E663 Overweight: Secondary | ICD-10-CM | POA: Diagnosis not present

## 2022-05-11 DIAGNOSIS — R400 Somnolence: Secondary | ICD-10-CM | POA: Diagnosis not present

## 2022-05-11 DIAGNOSIS — J45909 Unspecified asthma, uncomplicated: Secondary | ICD-10-CM | POA: Diagnosis not present

## 2022-05-11 DIAGNOSIS — R5383 Other fatigue: Secondary | ICD-10-CM | POA: Diagnosis not present

## 2022-05-11 DIAGNOSIS — M5030 Other cervical disc degeneration, unspecified cervical region: Secondary | ICD-10-CM | POA: Diagnosis not present

## 2022-05-11 DIAGNOSIS — Z6826 Body mass index (BMI) 26.0-26.9, adult: Secondary | ICD-10-CM | POA: Diagnosis not present

## 2022-05-22 DIAGNOSIS — J069 Acute upper respiratory infection, unspecified: Secondary | ICD-10-CM | POA: Diagnosis not present

## 2022-05-26 ENCOUNTER — Other Ambulatory Visit: Payer: Self-pay

## 2022-05-26 ENCOUNTER — Emergency Department (HOSPITAL_COMMUNITY): Payer: Medicare HMO

## 2022-05-26 ENCOUNTER — Emergency Department (HOSPITAL_COMMUNITY)
Admission: EM | Admit: 2022-05-26 | Discharge: 2022-05-26 | Disposition: A | Discharge status: Home / Self Care | Payer: Medicare HMO | Attending: Emergency Medicine | Admitting: Emergency Medicine

## 2022-05-26 ENCOUNTER — Encounter (HOSPITAL_COMMUNITY): Payer: Self-pay | Admitting: Emergency Medicine

## 2022-05-26 DIAGNOSIS — K219 Gastro-esophageal reflux disease without esophagitis: Secondary | ICD-10-CM | POA: Diagnosis not present

## 2022-05-26 DIAGNOSIS — R109 Unspecified abdominal pain: Secondary | ICD-10-CM | POA: Insufficient documentation

## 2022-05-26 DIAGNOSIS — I10 Essential (primary) hypertension: Secondary | ICD-10-CM | POA: Diagnosis not present

## 2022-05-26 DIAGNOSIS — J45909 Unspecified asthma, uncomplicated: Secondary | ICD-10-CM | POA: Insufficient documentation

## 2022-05-26 DIAGNOSIS — R079 Chest pain, unspecified: Secondary | ICD-10-CM | POA: Diagnosis not present

## 2022-05-26 DIAGNOSIS — R197 Diarrhea, unspecified: Secondary | ICD-10-CM | POA: Diagnosis not present

## 2022-05-26 DIAGNOSIS — R112 Nausea with vomiting, unspecified: Secondary | ICD-10-CM

## 2022-05-26 DIAGNOSIS — U071 COVID-19: Secondary | ICD-10-CM | POA: Diagnosis not present

## 2022-05-26 DIAGNOSIS — R0602 Shortness of breath: Secondary | ICD-10-CM | POA: Diagnosis not present

## 2022-05-26 DIAGNOSIS — Z7982 Long term (current) use of aspirin: Secondary | ICD-10-CM | POA: Insufficient documentation

## 2022-05-26 DIAGNOSIS — Z9049 Acquired absence of other specified parts of digestive tract: Secondary | ICD-10-CM | POA: Diagnosis not present

## 2022-05-26 DIAGNOSIS — R059 Cough, unspecified: Secondary | ICD-10-CM | POA: Diagnosis present

## 2022-05-26 DIAGNOSIS — K5730 Diverticulosis of large intestine without perforation or abscess without bleeding: Secondary | ICD-10-CM | POA: Diagnosis not present

## 2022-05-26 LAB — COMPREHENSIVE METABOLIC PANEL
ALT: 17 U/L (ref 0–44)
AST: 20 U/L (ref 15–41)
Albumin: 4.2 g/dL (ref 3.5–5.0)
Alkaline Phosphatase: 64 U/L (ref 38–126)
Anion gap: 12 (ref 5–15)
BUN: 14 mg/dL (ref 8–23)
CO2: 31 mmol/L (ref 22–32)
Calcium: 9.5 mg/dL (ref 8.9–10.3)
Chloride: 97 mmol/L — ABNORMAL LOW (ref 98–111)
Creatinine, Ser: 0.66 mg/dL (ref 0.44–1.00)
GFR, Estimated: >60 mL/min (ref >60)
Glucose, Bld: 136 mg/dL — ABNORMAL HIGH (ref 70–99)
Potassium: 3.9 mmol/L (ref 3.5–5.1)
Sodium: 140 mmol/L (ref 135–145)
Total Bilirubin: 0.6 mg/dL (ref 0.3–1.2)
Total Protein: 8 g/dL (ref 6.5–8.1)

## 2022-05-26 LAB — CBC
HCT: 44.3 % (ref 36.0–46.0)
Hemoglobin: 14.9 g/dL (ref 12.0–15.0)
MCH: 30.6 pg (ref 26.0–34.0)
MCHC: 33.6 g/dL (ref 30.0–36.0)
MCV: 91 fL (ref 80.0–100.0)
Platelets: 380 10*3/uL (ref 150–400)
RBC: 4.87 MIL/uL (ref 3.87–5.11)
RDW: 12.9 % (ref 11.5–15.5)
WBC: 9.6 10*3/uL (ref 4.0–10.5)
nRBC: 0 % (ref 0.0–0.2)

## 2022-05-26 LAB — TROPONIN I (HIGH SENSITIVITY)
Troponin I (High Sensitivity): 3 ng/L (ref <18)
Troponin I (High Sensitivity): 3 ng/L (ref <18)

## 2022-05-26 LAB — LIPASE, BLOOD: Lipase: 31 U/L (ref 11–51)

## 2022-05-26 LAB — RESP PANEL BY RT-PCR (FLU A&B, COVID) ARPGX2
Influenza A by PCR: NEGATIVE
Influenza B by PCR: NEGATIVE
SARS Coronavirus 2 by RT PCR: POSITIVE — AB

## 2022-05-26 MED ORDER — ONDANSETRON 8 MG PO TBDP: 8.0000 mg | ORAL_TABLET | Freq: Three times a day (TID) | ORAL | 0 refills | Status: DC | PRN

## 2022-05-26 MED ORDER — SODIUM CHLORIDE 0.9 % IV SOLN
1000.0000 mL | INTRAVENOUS | Status: DC
  Administered 2022-05-26: 1000 mL via INTRAVENOUS

## 2022-05-26 MED ORDER — PANTOPRAZOLE SODIUM 40 MG PO TBEC: 40.0000 mg | DELAYED_RELEASE_TABLET | Freq: Every day | ORAL | 0 refills | Status: DC

## 2022-05-26 MED ORDER — ONDANSETRON 4 MG PO TBDP
ORAL_TABLET | ORAL | Status: AC
  Filled 2022-05-26: qty 1

## 2022-05-26 MED ORDER — ONDANSETRON 4 MG PO TBDP
4.0000 mg | ORAL_TABLET | Freq: Once | ORAL | Status: AC
  Administered 2022-05-26: 4 mg via ORAL
  Filled 2022-05-26: qty 1

## 2022-05-26 MED ORDER — ONDANSETRON HCL 4 MG/2ML IJ SOLN
4.0000 mg | Freq: Once | INTRAMUSCULAR | Status: AC
  Administered 2022-05-26: 4 mg via INTRAVENOUS
  Filled 2022-05-26: qty 2

## 2022-05-26 MED ORDER — IOHEXOL 300 MG/ML  SOLN
100.0000 mL | Freq: Once | INTRAMUSCULAR | Status: AC | PRN
  Administered 2022-05-26: 100 mL via INTRAVENOUS

## 2022-05-26 MED ORDER — PANTOPRAZOLE SODIUM 40 MG IV SOLR
40.0000 mg | Freq: Once | INTRAVENOUS | Status: AC
  Administered 2022-05-26: 40 mg via INTRAVENOUS
  Filled 2022-05-26: qty 10

## 2022-05-26 MED ORDER — SODIUM CHLORIDE 0.9 % IV BOLUS (SEPSIS)
1000.0000 mL | Freq: Once | INTRAVENOUS | Status: AC
  Administered 2022-05-26: 1000 mL via INTRAVENOUS

## 2022-05-26 MED ORDER — SUCRALFATE 1 G PO TABS: 1.0000 g | ORAL_TABLET | Freq: Three times a day (TID) | ORAL | 0 refills | Status: DC

## 2022-05-26 NOTE — ED Provider Notes (Signed)
[facility] EMERGENCY DEPARTMENT Provider Note   CSN: [id number] Arrival date & time: 05/26/22  3825     History  Chief Complaint  Patient presents with   Emesis    Gina Wolfe is a 66 y.o. female.   Emesis    Patient has history of IBS, diverticulitis, asthma, GERD, who presents to the ED with complaints of nausea vomiting chest and abdominal pain.  Patient states she started having some URI type symptoms earlier this week.  Her doctor started on antibiotics infection.  Patient states she continues to have cough but last evening she started having multiple episodes of vomiting.  She is having discomfort in her upper abdomen that goes into her chest.  She denies any diarrhea.  She does not have any urinary symptoms.  Patient has had prior abdominal surgeries including a cholecystectomy hysterectomy  Home Medications Prior to Admission medications   Medication Sig Start Date End Date Taking? Authorizing Provider  ondansetron (ZOFRAN-ODT) 8 MG disintegrating tablet Take 1 tablet (8 mg total) by mouth every 8 (eight) hours as needed for nausea or vomiting. 05/26/22  Yes Dorie Rank, MD  pantoprazole (PROTONIX) 40 MG tablet Take 1 tablet (40 mg total) by mouth daily. 05/26/22 06/25/22 Yes Dorie Rank, MD  sucralfate (CARAFATE) 1 g tablet Take 1 tablet (1 g total) by mouth 4 (four) times daily -  with meals and at bedtime for 10 days. 05/26/22 06/05/22 Yes Dorie Rank, MD  albuterol (PROVENTIL HFA;VENTOLIN HFA) 108 (90 Base) MCG/ACT inhaler Inhale 2 puffs into the lungs every 6 (six) hours as needed for wheezing or shortness of breath.     [provider]  aspirin EC 81 MG tablet Take 81 mg by mouth daily. Swallow whole.    [provider]  cholecalciferol (VITAMIN D3) 25 MCG (1000 UNIT) tablet Take 1,000 Units by mouth daily.    [provider]  cyclobenzaprine (FLEXERIL) 10 MG tablet Take 1 tablet (10 mg total) by mouth 2 (two) times daily as needed for muscle  spasms. Patient taking differently: Take 10 mg by mouth 3 (three) times daily as needed for muscle spasms. 06/15/21   Marcello Fennel, PA-C  estrogens, conjugated, (PREMARIN) 0.625 MG tablet Take 0.625 mg by mouth daily with breakfast.     [provider]  furosemide (LASIX) 20 MG tablet Take 20 mg by mouth daily as needed for edema.     [provider]  gabapentin (NEURONTIN) 100 MG capsule Take 100 mg by mouth 3 (three) times daily as needed (Nerve). 11/25/21   [provider]  linaclotide (LINZESS) 290 MCG CAPS capsule Take 290 mcg by mouth daily.    [provider]  metoprolol succinate (TOPROL XL) 25 MG 24 hr tablet Take 0.5 tablets (12.5 mg total) by mouth daily. 01/18/22   Strader, Fransisco Hertz, PA-C  oxyCODONE-acetaminophen (PERCOCET) 10-325 MG tablet Take 1 tablet by mouth every 4 (four) hours as needed for pain. 05/02/21   [provider]  promethazine (PHENERGAN) 25 MG tablet Take 25 mg by mouth every 6 (six) hours as needed for nausea or vomiting.    [provider]  ticagrelor (BRILINTA) 90 MG TABS tablet Take 90 mg by mouth daily.    [provider]      Allergies    Clindamycin hcl and Codeine    Review of Systems   Review of Systems  Gastrointestinal:  Positive for vomiting.    Physical Exam Updated Vital Signs BP 132/63  Pulse 80   Temp 98 F (36.7 C) (Oral)   Resp 13   Ht 1.575 m ('5\' 2"'$ )   Wt 67.6 kg   SpO2 99%   BMI 27.25 kg/m  Physical Exam Vitals and nursing note reviewed.  Constitutional:      General: She is not in acute distress.    Appearance: She is well-developed.  HENT:     Head: Normocephalic and atraumatic.     Right Ear: External ear normal.     Left Ear: External ear normal.  Eyes:     General: No scleral icterus.       Right eye: No discharge.        Left eye: No discharge.     Conjunctiva/sclera: Conjunctivae normal.  Neck:     Trachea: No tracheal deviation.   Cardiovascular:     Rate and Rhythm: Normal rate and regular rhythm.  Pulmonary:     Effort: Pulmonary effort is normal. No respiratory distress.     Breath sounds: Normal breath sounds. No stridor. No wheezing or rales.  Abdominal:     General: Bowel sounds are normal. There is no distension.     Palpations: Abdomen is soft.     Tenderness: There is abdominal tenderness. There is no guarding or rebound.  Musculoskeletal:        General: No tenderness or deformity.     Cervical back: Neck supple.  Skin:    General: Skin is warm and dry.     Findings: No rash.  Neurological:     General: No focal deficit present.     Mental Status: She is alert.     Cranial Nerves: No cranial nerve deficit (no facial droop, extraocular movements intact, no slurred speech).     Sensory: No sensory deficit.     Motor: No abnormal muscle tone or seizure activity.     Coordination: Coordination normal.  Psychiatric:        Mood and Affect: Mood normal.     ED Results / Procedures / Treatments   Labs (all labs ordered are listed, but only abnormal results are displayed) Labs Reviewed  RESP PANEL BY RT-PCR (FLU A&B, COVID) ARPGX2 - Abnormal; Notable for the following components:      Result Value   SARS Coronavirus 2 by RT PCR POSITIVE (*)    All other components within normal limits  COMPREHENSIVE METABOLIC PANEL - Abnormal; Notable for the following components:   Chloride 97 (*)    Glucose, Bld 136 (*)    All other components within normal limits  LIPASE, BLOOD  CBC  URINALYSIS, ROUTINE W REFLEX MICROSCOPIC  TROPONIN I (HIGH SENSITIVITY)  TROPONIN I (HIGH SENSITIVITY)    EKG EKG Interpretation  Date/Time:  Friday May 26 2022 09:38:51 EDT Ventricular Rate:  78 PR Interval:  152 QRS Duration: 70 QT Interval:  376 QTC Calculation: 428 R Axis:   69 Text Interpretation: Normal sinus rhythm Low voltage QRS  diffuse nonspecific T waves Confirmed by Sherwood Gambler [id number]) on  05/26/2022 10:21:05 AM  Radiology CT ABDOMEN PELVIS W CONTRAST  Result Date: 05/26/2022 CLINICAL DATA:  Abdominal pain, nausea, vomiting EXAM: CT ABDOMEN AND PELVIS WITH CONTRAST TECHNIQUE: Multidetector CT imaging of the abdomen and pelvis was performed using the standard protocol following bolus administration of intravenous contrast. RADIATION DOSE REDUCTION: This exam was performed according to the departmental dose-optimization program which includes automated exposure control, adjustment of the mA and/or kV according to patient size and/or use  of iterative reconstruction technique. CONTRAST:  154m OMNIPAQUE IOHEXOL 300 MG/ML  SOLN COMPARISON:  12/17/2017 FINDINGS: Lower chest: No acute pleural or parenchymal lung disease. Hepatobiliary: 8 mm hypodensity left lobe liver most consistent with a hepatic cyst. Otherwise the liver is unremarkable. Gallbladder is surgically absent. No biliary duct dilation. Pancreas: Unremarkable. No pancreatic ductal dilatation or surrounding inflammatory changes. Spleen: Normal in size without focal abnormality. Adrenals/Urinary Tract: Adrenal glands are unremarkable. Kidneys are normal, without renal calculi, focal lesion, or hydronephrosis. Bladder is unremarkable. Stomach/Bowel: No bowel obstruction or ileus. Prominent diverticulosis of the distal colon without evidence of acute diverticulitis. Normal appendix right lower quadrant. No bowel wall thickening or inflammatory change. Vascular/Lymphatic: Aortic atherosclerosis. No enlarged abdominal or pelvic lymph nodes. Reproductive: Status post hysterectomy. No adnexal masses. Other: No free fluid or free intraperitoneal gas. No abdominal wall hernia. Musculoskeletal: No acute or destructive bony lesions. Multilevel lumbar spondylosis greatest at L5-S1. Reconstructed images demonstrate no additional findings. IMPRESSION: 1. Distal colonic diverticulosis without evidence of acute diverticulitis. 2. No acute intra-abdominal or  intrapelvic process. 3.  Aortic Atherosclerosis (ICD10-I70.0). Electronically Signed   By: MRanda NgoM.D.   On: 05/26/2022 18:35   DG Chest 2 View  Result Date: 05/26/2022 CLINICAL DATA:  cp/sob EXAM: CHEST - 2 VIEW COMPARISON:  May 10, 2021 FINDINGS: The cardiomediastinal silhouette is unchanged in contour. No pleural effusion. No pneumothorax. No acute pleuroparenchymal abnormality. Status post cholecystectomy. Orthopedic hardware of the cervical spine, incompletely visualized. IMPRESSION: No acute cardiopulmonary abnormality. Electronically Signed   By: SValentino SaxonM.D.   On: 05/26/2022 10:03    Procedures Procedures    Medications Ordered in ED Medications  sodium chloride 0.9 % bolus 1,000 mL (0 mLs Intravenous Stopped 05/26/22 1933)    Followed by  0.9 %  sodium chloride infusion (1,000 mLs Intravenous New Bag/Given 05/26/22 1806)  ondansetron (ZOFRAN-ODT) disintegrating tablet 4 mg (4 mg Oral Given 05/26/22 1038)  ondansetron (ZOFRAN) injection 4 mg (4 mg Intravenous Given 05/26/22 1802)  pantoprazole (PROTONIX) injection 40 mg (40 mg Intravenous Given 05/26/22 1805)  iohexol (OMNIPAQUE) 300 MG/ML solution 100 mL (100 mLs Intravenous Contrast Given 05/26/22 1813)    ED Course/ Medical Decision Making/ A&P Clinical Course as of 05/26/22 2001  Fri May 26, 2022  1742 Lipase, blood [JK]  1742 Normal [JK]  1742 Troponin I (High Sensitivity) Normal [JK]  1742 Comprehensive metabolic panel(!) Normal [JK]  1742 CBC Normal [JK]  1849 CT ABDOMEN PELVIS W CONTRAST CT scan without acute findings.  No evidence of diverticulitis or bowel obstruction [JK]    Clinical Course User Index [JK] KDorie Rank MD                           Medical Decision Making Presented with complaints of abdominal pain vomiting.  Differential diagnosis includes but not limited to hepatitis pancreatitis bowel obstruction colitis diverticulitis, gastroesophageal reflux.  Cardiac etiology less  likely  Problems Addressed: Gastroesophageal reflux disease, unspecified whether esophagitis present: acute illness or injury that poses a threat to life or bodily functions Nausea vomiting and diarrhea: acute illness or injury that poses a threat to life or bodily functions  Amount and/or Complexity of Data Reviewed Labs:  Decision-making details documented in ED Course. Radiology: ordered and independent interpretation performed. Decision-making details documented in ED Course.  Risk Prescription drug management.  ED work-up is reassuring.  No signs of any significant electrolyte abnormalities.  No signs of hepatitis  pancreatitis.  CT scan was performed and there is no evidence of bowel obstruction or other acute abnormality.  Patient's COVID test is actually positive.  Certainly would account for her URI type symptoms that started earlier the week.  No signs of any respiratory difficulty.  No indications for hospitalization.  Evaluation and diagnostic testing in the emergency department does not suggest an emergent condition requiring admission or immediate intervention beyond what has been performed at this time.  The patient is safe for discharge and has been instructed to return immediately for worsening symptoms, change in symptoms or any other concerns.         Final Clinical Impression(s) / ED Diagnoses Final diagnoses:  Nausea vomiting and diarrhea  Gastroesophageal reflux disease, unspecified whether esophagitis present  COVID-19 virus infection    Rx / DC Orders ED Discharge Orders          Ordered    pantoprazole (PROTONIX) 40 MG tablet  Daily        05/26/22 1935    sucralfate (CARAFATE) 1 g tablet  3 times daily with meals & bedtime        05/26/22 1935    ondansetron (ZOFRAN-ODT) 8 MG disintegrating tablet  Every 8 hours PRN        05/26/22 Mardelle Matte, MD 05/26/22 2001

## 2022-05-26 NOTE — ED Provider Triage Note (Signed)
Emergency Medicine Provider Triage Evaluation Note  Gina Wolfe , a 66 y.o. female  was evaluated in triage.  Pt complains of not feeling well.  Patient with chest pain, abdominal pain, nausea vomiting which began yesterday.  Has been on antibiotics, amoxicillin since Monday for upper respiratory infection.  She continues to have cough.  Multiple episodes of NBNB emesis.  No diarrhea, no urinary symptoms  Review of Systems  Positive: Cough, CP, abd pain Negative:   Physical Exam  BP (!) 143/72   Pulse 80   Temp 98.3 F (36.8 C) (Oral)   Resp 18   Ht '5\' 2"'$  (1.575 m)   Wt 67.6 kg   SpO2 100%   BMI 27.25 kg/m  Gen:   Awake, no distress   Resp:  Normal effort  MSK:   Moves extremities without difficulty  Other:    Medical Decision Making  Medically screening exam initiated at 10:35 AM.  Appropriate orders placed.  Gina Wolfe was informed that the remainder of the evaluation will be completed by another provider, this initial triage assessment does not replace that evaluation, and the importance of remaining in the ED until their evaluation is complete.  Cough, CP, ABD pain   Gina Wolfe A, PA-C 05/26/22 1036

## 2022-05-26 NOTE — ED Notes (Signed)
Pt returned from CT °

## 2022-05-26 NOTE — ED Triage Notes (Signed)
Pt c/o abd/cp/n/v since last night. Pt has been on amoxicillin since Monday for URI.

## 2022-05-26 NOTE — ED Notes (Signed)
Patient transported to CT 

## 2022-05-26 NOTE — ED Notes (Addendum)
Back into triage for VS and re-check, reports "can't swallow, throat closing up, feeling worse". LS CTA, throat unremarkable.

## 2022-05-26 NOTE — Discharge Instructions (Addendum)
Take the medications as prescribed to help with your nausea vomiting and acid reflux.  Follow-up with your primary care doctor or consider seeing a GI doctor for further evaluation.  Return to the ED for recurrent or worsening symptoms

## 2022-06-16 DIAGNOSIS — Z6827 Body mass index (BMI) 27.0-27.9, adult: Secondary | ICD-10-CM | POA: Diagnosis not present

## 2022-06-16 DIAGNOSIS — M5136 Other intervertebral disc degeneration, lumbar region: Secondary | ICD-10-CM | POA: Diagnosis not present

## 2022-06-16 DIAGNOSIS — M5030 Other cervical disc degeneration, unspecified cervical region: Secondary | ICD-10-CM | POA: Diagnosis not present

## 2022-06-16 DIAGNOSIS — M159 Polyosteoarthritis, unspecified: Secondary | ICD-10-CM | POA: Diagnosis not present

## 2022-06-16 DIAGNOSIS — G894 Chronic pain syndrome: Secondary | ICD-10-CM | POA: Diagnosis not present

## 2022-06-28 DIAGNOSIS — G894 Chronic pain syndrome: Secondary | ICD-10-CM | POA: Diagnosis not present

## 2022-06-28 DIAGNOSIS — M5136 Other intervertebral disc degeneration, lumbar region: Secondary | ICD-10-CM | POA: Diagnosis not present

## 2022-06-28 DIAGNOSIS — M159 Polyosteoarthritis, unspecified: Secondary | ICD-10-CM | POA: Diagnosis not present

## 2022-06-28 DIAGNOSIS — M5030 Other cervical disc degeneration, unspecified cervical region: Secondary | ICD-10-CM | POA: Diagnosis not present

## 2022-07-25 DIAGNOSIS — E663 Overweight: Secondary | ICD-10-CM | POA: Diagnosis not present

## 2022-07-25 DIAGNOSIS — Z0001 Encounter for general adult medical examination with abnormal findings: Secondary | ICD-10-CM | POA: Diagnosis not present

## 2022-07-25 DIAGNOSIS — J329 Chronic sinusitis, unspecified: Secondary | ICD-10-CM | POA: Diagnosis not present

## 2022-07-25 DIAGNOSIS — K219 Gastro-esophageal reflux disease without esophagitis: Secondary | ICD-10-CM | POA: Diagnosis not present

## 2022-07-25 DIAGNOSIS — Z6826 Body mass index (BMI) 26.0-26.9, adult: Secondary | ICD-10-CM | POA: Diagnosis not present

## 2022-07-25 DIAGNOSIS — Z1331 Encounter for screening for depression: Secondary | ICD-10-CM | POA: Diagnosis not present

## 2022-07-25 DIAGNOSIS — M159 Polyosteoarthritis, unspecified: Secondary | ICD-10-CM | POA: Diagnosis not present

## 2022-07-25 DIAGNOSIS — M5030 Other cervical disc degeneration, unspecified cervical region: Secondary | ICD-10-CM | POA: Diagnosis not present

## 2022-07-25 DIAGNOSIS — G894 Chronic pain syndrome: Secondary | ICD-10-CM | POA: Diagnosis not present

## 2022-07-25 DIAGNOSIS — E039 Hypothyroidism, unspecified: Secondary | ICD-10-CM | POA: Diagnosis not present

## 2022-07-25 DIAGNOSIS — D518 Other vitamin B12 deficiency anemias: Secondary | ICD-10-CM | POA: Diagnosis not present

## 2022-07-25 DIAGNOSIS — E559 Vitamin D deficiency, unspecified: Secondary | ICD-10-CM | POA: Diagnosis not present

## 2022-08-08 ENCOUNTER — Ambulatory Visit: Payer: Medicare HMO | Admitting: Cardiology

## 2022-08-24 DIAGNOSIS — G894 Chronic pain syndrome: Secondary | ICD-10-CM | POA: Diagnosis not present

## 2022-08-24 DIAGNOSIS — M5030 Other cervical disc degeneration, unspecified cervical region: Secondary | ICD-10-CM | POA: Diagnosis not present

## 2022-08-24 DIAGNOSIS — J329 Chronic sinusitis, unspecified: Secondary | ICD-10-CM | POA: Diagnosis not present

## 2022-08-24 DIAGNOSIS — K219 Gastro-esophageal reflux disease without esophagitis: Secondary | ICD-10-CM | POA: Diagnosis not present

## 2022-08-29 DIAGNOSIS — G894 Chronic pain syndrome: Secondary | ICD-10-CM | POA: Diagnosis not present

## 2022-08-29 DIAGNOSIS — F1120 Opioid dependence, uncomplicated: Secondary | ICD-10-CM | POA: Diagnosis not present

## 2022-08-29 DIAGNOSIS — M5030 Other cervical disc degeneration, unspecified cervical region: Secondary | ICD-10-CM | POA: Diagnosis not present

## 2022-09-22 ENCOUNTER — Other Ambulatory Visit: Payer: Self-pay | Admitting: Neurosurgery

## 2022-09-22 DIAGNOSIS — I671 Cerebral aneurysm, nonruptured: Secondary | ICD-10-CM

## 2022-09-25 DIAGNOSIS — G894 Chronic pain syndrome: Secondary | ICD-10-CM | POA: Diagnosis not present

## 2022-09-25 DIAGNOSIS — I700 Atherosclerosis of aorta: Secondary | ICD-10-CM | POA: Diagnosis not present

## 2022-09-25 DIAGNOSIS — Z6827 Body mass index (BMI) 27.0-27.9, adult: Secondary | ICD-10-CM | POA: Diagnosis not present

## 2022-09-25 DIAGNOSIS — M5030 Other cervical disc degeneration, unspecified cervical region: Secondary | ICD-10-CM | POA: Diagnosis not present

## 2022-09-25 DIAGNOSIS — F1120 Opioid dependence, uncomplicated: Secondary | ICD-10-CM | POA: Diagnosis not present

## 2022-09-25 DIAGNOSIS — M5136 Other intervertebral disc degeneration, lumbar region: Secondary | ICD-10-CM | POA: Diagnosis not present

## 2022-09-25 DIAGNOSIS — E663 Overweight: Secondary | ICD-10-CM | POA: Diagnosis not present

## 2022-09-25 DIAGNOSIS — D518 Other vitamin B12 deficiency anemias: Secondary | ICD-10-CM | POA: Diagnosis not present

## 2022-09-26 ENCOUNTER — Other Ambulatory Visit: Payer: Self-pay | Admitting: Neurosurgery

## 2022-09-29 ENCOUNTER — Ambulatory Visit (HOSPITAL_COMMUNITY)
Admission: RE | Admit: 2022-09-29 | Discharge: 2022-09-29 | Disposition: A | Discharge status: Home / Self Care | Payer: Medicare HMO | Source: Ambulatory Visit | Attending: Neurosurgery | Admitting: Neurosurgery

## 2022-09-29 ENCOUNTER — Other Ambulatory Visit: Payer: Self-pay

## 2022-09-29 ENCOUNTER — Other Ambulatory Visit: Payer: Self-pay | Admitting: Neurosurgery

## 2022-09-29 DIAGNOSIS — I671 Cerebral aneurysm, nonruptured: Secondary | ICD-10-CM | POA: Diagnosis not present

## 2022-09-29 DIAGNOSIS — Z87891 Personal history of nicotine dependence: Secondary | ICD-10-CM | POA: Diagnosis not present

## 2022-09-29 DIAGNOSIS — Z7982 Long term (current) use of aspirin: Secondary | ICD-10-CM | POA: Diagnosis not present

## 2022-09-29 DIAGNOSIS — Z7902 Long term (current) use of antithrombotics/antiplatelets: Secondary | ICD-10-CM | POA: Diagnosis not present

## 2022-09-29 DIAGNOSIS — Z48812 Encounter for surgical aftercare following surgery on the circulatory system: Secondary | ICD-10-CM | POA: Diagnosis not present

## 2022-09-29 HISTORY — PX: IR ANGIO INTRA EXTRACRAN SEL INTERNAL CAROTID UNI R MOD SED: IMG5362

## 2022-09-29 HISTORY — PX: IR US GUIDE VASC ACCESS RIGHT: IMG2390

## 2022-09-29 HISTORY — PX: IR ANGIO INTRA EXTRACRAN SEL COM CAROTID INNOMINATE UNI L MOD SED: IMG5358

## 2022-09-29 LAB — CBC WITH DIFFERENTIAL/PLATELET
Abs Immature Granulocytes: 0.02 10*3/uL (ref 0.00–0.07)
Basophils Absolute: 0 10*3/uL (ref 0.0–0.1)
Basophils Relative: 0 %
Eosinophils Absolute: 0.1 10*3/uL (ref 0.0–0.5)
Eosinophils Relative: 1 %
HCT: 38.7 % (ref 36.0–46.0)
Hemoglobin: 12.9 g/dL (ref 12.0–15.0)
Immature Granulocytes: 0 %
Lymphocytes Relative: 31 %
Lymphs Abs: 3 10*3/uL (ref 0.7–4.0)
MCH: 30.5 pg (ref 26.0–34.0)
MCHC: 33.3 g/dL (ref 30.0–36.0)
MCV: 91.5 fL (ref 80.0–100.0)
Monocytes Absolute: 0.8 10*3/uL (ref 0.1–1.0)
Monocytes Relative: 8 %
Neutro Abs: 5.8 10*3/uL (ref 1.7–7.7)
Neutrophils Relative %: 60 %
Platelets: 337 10*3/uL (ref 150–400)
RBC: 4.23 MIL/uL (ref 3.87–5.11)
RDW: 13.3 % (ref 11.5–15.5)
WBC: 9.7 10*3/uL (ref 4.0–10.5)
nRBC: 0 % (ref 0.0–0.2)

## 2022-09-29 LAB — URINALYSIS, ROUTINE W REFLEX MICROSCOPIC
Bilirubin Urine: NEGATIVE
Glucose, UA: NEGATIVE mg/dL
Hgb urine dipstick: NEGATIVE
Ketones, ur: NEGATIVE mg/dL
Leukocytes,Ua: NEGATIVE
Nitrite: NEGATIVE
Protein, ur: NEGATIVE mg/dL
Specific Gravity, Urine: 1.02 (ref 1.005–1.030)
pH: 5 (ref 5.0–8.0)

## 2022-09-29 LAB — BASIC METABOLIC PANEL
Anion gap: 8 (ref 5–15)
BUN: 13 mg/dL (ref 8–23)
CO2: 28 mmol/L (ref 22–32)
Calcium: 8.5 mg/dL — ABNORMAL LOW (ref 8.9–10.3)
Chloride: 104 mmol/L (ref 98–111)
Creatinine, Ser: 0.71 mg/dL (ref 0.44–1.00)
GFR, Estimated: >60 mL/min (ref >60)
Glucose, Bld: 72 mg/dL (ref 70–99)
Potassium: 3.5 mmol/L (ref 3.5–5.1)
Sodium: 140 mmol/L (ref 135–145)

## 2022-09-29 LAB — APTT: aPTT: 25 seconds (ref 24–36)

## 2022-09-29 LAB — PROTIME-INR
INR: 1 (ref 0.8–1.2)
Prothrombin Time: 13 seconds (ref 11.4–15.2)

## 2022-09-29 MED ORDER — HEPARIN SODIUM (PORCINE) 1000 UNIT/ML IJ SOLN
INTRAMUSCULAR | Status: AC
  Filled 2022-09-29: qty 10

## 2022-09-29 MED ORDER — SODIUM CHLORIDE 0.9 % IV SOLN: INTRAVENOUS | Status: DC

## 2022-09-29 MED ORDER — MIDAZOLAM HCL 2 MG/2ML IJ SOLN
INTRAMUSCULAR | Status: AC | PRN
  Administered 2022-09-29: 1 mg via INTRAVENOUS

## 2022-09-29 MED ORDER — CHLORHEXIDINE GLUCONATE CLOTH 2 % EX PADS: 6.0000 | MEDICATED_PAD | Freq: Once | CUTANEOUS | Status: DC

## 2022-09-29 MED ORDER — LIDOCAINE HCL 1 % IJ SOLN
INTRAMUSCULAR | Status: AC
  Filled 2022-09-29: qty 20

## 2022-09-29 MED ORDER — FENTANYL CITRATE (PF) 100 MCG/2ML IJ SOLN
INTRAMUSCULAR | Status: AC | PRN
  Administered 2022-09-29: 25 ug via INTRAVENOUS

## 2022-09-29 MED ORDER — VERAPAMIL HCL 2.5 MG/ML IV SOLN
INTRAVENOUS | Status: AC
  Filled 2022-09-29: qty 2

## 2022-09-29 MED ORDER — HYDROCODONE-ACETAMINOPHEN 5-325 MG PO TABS
1.0000 | ORAL_TABLET | ORAL | Status: DC | PRN
  Administered 2022-09-29: 1 via ORAL
  Filled 2022-09-29: qty 1

## 2022-09-29 MED ORDER — FENTANYL CITRATE (PF) 100 MCG/2ML IJ SOLN
INTRAMUSCULAR | Status: AC
  Filled 2022-09-29: qty 2

## 2022-09-29 MED ORDER — CEFAZOLIN SODIUM-DEXTROSE 2-4 GM/100ML-% IV SOLN: 2.0000 g | INTRAVENOUS | Status: DC

## 2022-09-29 MED ORDER — IOHEXOL 300 MG/ML  SOLN
100.0000 mL | Freq: Once | INTRAMUSCULAR | Status: AC | PRN
  Administered 2022-09-29: 50 mL via INTRA_ARTERIAL

## 2022-09-29 MED ORDER — NITROGLYCERIN 1 MG/10 ML FOR IR/CATH LAB: INTRA_ARTERIAL | Status: AC | PRN

## 2022-09-29 MED ORDER — VERAPAMIL HCL 2.5 MG/ML IV SOLN: INTRA_ARTERIAL | Status: AC | PRN

## 2022-09-29 MED ORDER — HEPARIN SODIUM (PORCINE) 1000 UNIT/ML IJ SOLN
INTRAMUSCULAR | Status: AC | PRN
  Administered 2022-09-29: 3000 [IU] via INTRAVENOUS

## 2022-09-29 MED ORDER — MIDAZOLAM HCL 2 MG/2ML IJ SOLN
INTRAMUSCULAR | Status: AC
  Filled 2022-09-29: qty 2

## 2022-09-29 MED ORDER — NITROGLYCERIN 1 MG/10 ML FOR IR/CATH LAB
INTRA_ARTERIAL | Status: AC
  Filled 2022-09-29: qty 10

## 2022-09-29 NOTE — H&P (Signed)
Chief Complaint   Aneurysm  History of Present Illness  Gina Wolfe is a 67 y.o. 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 She is clinically well, remains on ASA/Brilinta  Past Medical History   Past Medical History:  Diagnosis Date   Arthritis    Asthma    Chronic pain syndrome    Diverticulitis    GERD (gastroesophageal reflux disease)    History of palpitations    Hypertension    pt states she has never had high BP   IBS (irritable bowel syndrome)    PONV (postoperative nausea and vomiting)    Spinal headache     Past Surgical History   Past Surgical History:  Procedure Laterality Date   ABDOMINAL HYSTERECTOMY     BACK SURGERY     x3 previous lumbar, x3 cervical surgeries   CHOLECYSTECTOMY     COLONOSCOPY  05/20/10   VPX:TGGYIRSW hemorrhoids otherwise normal/EGD:distal esophageal erosions otherwise normal   COLONOSCOPY WITH PROPOFOL N/A 08/15/2021   Procedure: COLONOSCOPY WITH PROPOFOL;  Surgeon: Daneil Dolin, MD;  [facility];  Service: Endoscopy;  Laterality: N/A;  7:30am   CYST EXCISION Right    Eye cyst   EYE SURGERY Right    removed a cyst   IR ANGIO INTRA EXTRACRAN SEL INTERNAL CAROTID BILAT MOD SED  02/21/2022   IR ANGIO INTRA EXTRACRAN SEL INTERNAL CAROTID UNI R MOD SED  03/27/2022   IR ANGIO VERTEBRAL SEL VERTEBRAL UNI R MOD SED  02/21/2022   IR ANGIOGRAM FOLLOW UP STUDY  03/27/2022   IR NEURO EACH ADD'L AFTER BASIC UNI RIGHT (MS)  03/27/2022   IR TRANSCATH/EMBOLIZ  03/24/2022   LUMBAR LAMINECTOMY/DECOMPRESSION MICRODISCECTOMY N/A 07/13/2016   Procedure: DISCECTOMY Lumbar four-five;  Surgeon: Newman Pies, MD;  Location: [place];  Service: Neurosurgery;  Laterality: N/A;   MENISCUS REPAIR  2007   R knee    POSTERIOR CERVICAL FUSION/FORAMINOTOMY N/A 04/10/2013   Procedure: POSTERIOR CERVICAL FUSION/FORAMINOTOMY LEVEL 2;  Surgeon: Sinclair Ship, MD;  Location: [place];  Service: Orthopedics;   Laterality: N/A;  Posterior cervical decompression fusion, cervical 5-6, cervical 6-7 with instrumentation and allograft.   RADIOLOGY WITH ANESTHESIA Right 03/24/2022   Procedure: Pipeline Embolization of Right ICA Aneurysm;  Surgeon: Consuella Lose, MD;  Location: [place];  Service: Radiology;  Laterality: Right;   TOTAL KNEE ARTHROPLASTY Right 01/03/2021   Procedure: TOTAL KNEE ARTHROPLASTY;  Surgeon: Gaynelle Arabian, MD;  Location: [facility];  Service: Orthopedics;  Laterality: Right;  33mn   TUBAL LIGATION      Social History   Social History   Tobacco Use   Smoking status: Former    Types: Cigarettes    Quit date: 08/25/2007    Years since quitting: 15.1   Smokeless tobacco: Never  Vaping Use   Vaping Use: Never used  Substance Use Topics   Alcohol use: No    Alcohol/week: 0.0 standard drinks of alcohol   Drug use: No    Medications   Prior to Admission medications   Medication Sig Start Date End Date Taking? Authorizing Provider  albuterol (PROVENTIL HFA;VENTOLIN HFA) 108 (90 Base) MCG/ACT inhaler Inhale 2 puffs into the lungs every 6 (six) hours as needed for wheezing or shortness of breath.     [provider]  aspirin EC 81 MG tablet Take 81 mg by mouth daily. Swallow whole.    [provider]  cholecalciferol (VITAMIN D3) 25 MCG (1000 UNIT)  tablet Take 1,000 Units by mouth daily.    [provider]  cyclobenzaprine (FLEXERIL) 10 MG tablet Take 1 tablet (10 mg total) by mouth 2 (two) times daily as needed for muscle spasms. Patient taking differently: Take 10 mg by mouth 3 (three) times daily as needed for muscle spasms. 06/15/21   Gina Fennel, PA-C  estrogens, conjugated, (PREMARIN) 0.625 MG tablet Take 0.625 mg by mouth daily with breakfast.     [provider]  furosemide (LASIX) 20 MG tablet Take 20 mg by mouth daily as needed for edema.     [provider]  gabapentin (NEURONTIN) 100 MG capsule Take 100 mg by mouth 3  (three) times daily as needed (Nerve). 11/25/21   [provider]  linaclotide (LINZESS) 290 MCG CAPS capsule Take 290 mcg by mouth daily.    [provider]  metoprolol succinate (TOPROL XL) 25 MG 24 hr tablet Take 0.5 tablets (12.5 mg total) by mouth daily. 01/18/22   Strader, Gina Hertz, PA-C  ondansetron (ZOFRAN-ODT) 8 MG disintegrating tablet Take 1 tablet (8 mg total) by mouth every 8 (eight) hours as needed for nausea or vomiting. 05/26/22   Dorie Rank, MD  oxyCODONE-acetaminophen (PERCOCET) 10-325 MG tablet Take 1 tablet by mouth every 4 (four) hours as needed for pain. 05/02/21   [provider]  pantoprazole (PROTONIX) 40 MG tablet Take 1 tablet (40 mg total) by mouth daily. 05/26/22 06/25/22  Dorie Rank, MD  promethazine (PHENERGAN) 25 MG tablet Take 25 mg by mouth every 6 (six) hours as needed for nausea or vomiting.    [provider]  sucralfate (CARAFATE) 1 g tablet Take 1 tablet (1 g total) by mouth 4 (four) times daily -  with meals and at bedtime for 10 days. 05/26/22 06/05/22  Dorie Rank, MD  ticagrelor (BRILINTA) 90 MG TABS tablet Take 90 mg by mouth daily.    [provider]    Allergies   Allergies  Allergen Reactions   Clindamycin Hcl Anaphylaxis   Codeine Nausea Only    Review of Systems  ROS  Neurologic Exam  Awake, alert, oriented Memory and concentration grossly intact Speech fluent, appropriate CN grossly intact Motor exam: Upper Extremities Deltoid Bicep Tricep Grip  Right 5/5 5/5 5/5 5/5  Left 5/5 5/5 5/5 5/5   Lower Extremities IP Quad PF DF EHL  Right 5/5 5/5 5/5 5/5 5/5  Left 5/5 5/5 5/5 5/5 5/5   Sensation grossly intact to LT   Impression  - 67 y.o. female 9mos/p elective Pipeline embolization of RICA aneurysm, doing well  Plan  - Proceed with diagnostic angiogram  I have reviewed the indications for the procedure as well as the details of the procedure and the expected postoperative course and  recovery at length with the patient in the office. We have also reviewed in detail the risks, benefits, and alternatives to the procedure. All questions were answered and [name] provided informed consent to proceed.  NConsuella Lose MD [facility]

## 2022-09-29 NOTE — Progress Notes (Signed)
TR BAND REMOVAL  LOCATION:    right radial  DEFLATED PER PROTOCOL:    Yes.    TIME BAND OFF / DRESSING APPLIED:    1400 gauze dressing applied   SITE UPON ARRIVAL:    Level 0  SITE AFTER BAND REMOVAL:    Level 0  CIRCULATION SENSATION AND MOVEMENT:    Within Normal Limits   Yes.    COMMENTS:   no issues

## 2022-09-29 NOTE — Brief Op Note (Signed)
  NEUROSURGERY BRIEF OPERATIVE  NOTE   PREOP DX: RICA aneurysm  POSTOP DX: Same  PROCEDURE: Diagnostic cerebral angiogram  SURGEON: Dr. Consuella Lose, MD  ANESTHESIA: IV Sedation with Local  APPROACH: Right trans-radial  EBL: Minimal  SPECIMENS: None  COMPLICATIONS: None  CONDITION: Stable to recovery  FINDINGS (Full report in [facility]): 1. Complete occlusion of previously seen RICA aneurysm 6 months after Pipeline embolization. No in-stent stenosis is seen.   Consuella Lose, MD [facility]

## 2022-10-09 DIAGNOSIS — I671 Cerebral aneurysm, nonruptured: Secondary | ICD-10-CM | POA: Diagnosis not present

## 2022-10-19 DIAGNOSIS — F1120 Opioid dependence, uncomplicated: Secondary | ICD-10-CM | POA: Diagnosis not present

## 2022-10-19 DIAGNOSIS — G894 Chronic pain syndrome: Secondary | ICD-10-CM | POA: Diagnosis not present

## 2022-10-19 DIAGNOSIS — M5136 Other intervertebral disc degeneration, lumbar region: Secondary | ICD-10-CM | POA: Diagnosis not present

## 2022-10-19 DIAGNOSIS — M5030 Other cervical disc degeneration, unspecified cervical region: Secondary | ICD-10-CM | POA: Diagnosis not present

## 2022-10-21 ENCOUNTER — Other Ambulatory Visit: Payer: Self-pay | Admitting: Student

## 2022-11-01 NOTE — Progress Notes (Unsigned)
Cardiology Office Note  Date: 11/02/2022   ID: Gina Wolfe, DOB March 08, 1956, MRN [id number]  PCP:  Redmond School, MD  Cardiologist:  Rozann Lesches, MD Electrophysiologist:  None   Chief Complaint  Patient presents with   Cardiac follow-up    History of Present Illness: Gina Wolfe is a 67 y.o. female last seen in May 2023 by Ms. Strader PA-C, I reviewed the note.  She reports intermittent palpitations as before, Toprol-XL 12.5 mg daily has been helpful but she was wondering if a full tablet would be of more benefit.  Symptoms are sporadic and not associated with dizziness or syncope.  She had a coronary CTA done back in June 2023 that showed normal coronaries with calcium score of 0.  She is status post Pipeline embolization of RICA aneurysm, followed by Dr. Kathyrn Sheriff.  Recent angiogram showed resolution of aneurysm and she was taken off Brilinta.  Past Medical History:  Diagnosis Date   Arthritis    Asthma    Chronic pain syndrome    Diverticulitis    GERD (gastroesophageal reflux disease)    History of palpitations    Hypertension    IBS (irritable bowel syndrome)    PONV (postoperative nausea and vomiting)    Spinal headache     Current Outpatient Medications  Medication Sig Dispense Refill   albuterol (PROVENTIL HFA;VENTOLIN HFA) 108 (90 Base) MCG/ACT inhaler Inhale 2 puffs into the lungs every 6 (six) hours as needed for wheezing or shortness of breath.      aspirin EC 81 MG tablet Take 81 mg by mouth daily. Swallow whole.     cholecalciferol (VITAMIN D3) 25 MCG (1000 UNIT) tablet Take 1,000 Units by mouth daily.     cyclobenzaprine (FLEXERIL) 10 MG tablet Take 1 tablet (10 mg total) by mouth 2 (two) times daily as needed for muscle spasms. (Patient taking differently: Take 10 mg by mouth 3 (three) times daily as needed for muscle spasms.) 20 tablet 0   estrogens, conjugated, (PREMARIN) 0.625 MG tablet Take 0.625 mg by mouth daily with breakfast.      furosemide  (LASIX) 20 MG tablet Take 20 mg by mouth daily as needed for edema.      linaclotide (LINZESS) 290 MCG CAPS capsule Take 290 mcg by mouth daily.     meloxicam (MOBIC) 7.5 MG tablet Take 7.5 mg by mouth 2 (two) times daily.     metoprolol succinate (TOPROL XL) 25 MG 24 hr tablet Take 1 tablet (25 mg total) by mouth daily. 90 tablet 3   ondansetron (ZOFRAN-ODT) 8 MG disintegrating tablet Take 1 tablet (8 mg total) by mouth every 8 (eight) hours as needed for nausea or vomiting. 12 tablet 0   oxyCODONE-acetaminophen (PERCOCET) 10-325 MG tablet Take 1 tablet by mouth every 4 (four) hours as needed for pain.     pantoprazole (PROTONIX) 40 MG tablet Take 1 tablet (40 mg total) by mouth daily. 30 tablet 0   promethazine (PHENERGAN) 25 MG tablet Take 25 mg by mouth every 6 (six) hours as needed for nausea or vomiting.     No current facility-administered medications for this visit.   Allergies:  Clindamycin hcl and Codeine   ROS: No orthopnea or PND.  Physical Exam: VS:  BP 124/60   Pulse 62   Ht 5' 2"$  (1.575 m)   Wt 152 lb (68.9 kg)   SpO2 98%   BMI 27.80 kg/m , BMI Body mass index is 27.8 kg/m.  Wt  Readings from Last 3 Encounters:  11/02/22 152 lb (68.9 kg)  09/29/22 148 lb (67.1 kg)  05/26/22 149 lb (67.6 kg)    General: Patient appears comfortable at rest. HEENT: Conjunctiva and lids normal. Neck: Supple, no elevated JVP or carotid bruits, no thyromegaly. Lungs: Clear to auscultation, nonlabored breathing at rest. Cardiac: Regular rate and rhythm, no S3 or significant systolic murmur, no pericardial rub.  ECG:  An ECG dated 05/26/2022 was personally reviewed today and demonstrated:  Sinus rhythm with borderline low voltage and nonspecific T wave changes.  Recent Labwork: 05/26/2022: ALT 17; AST 20 09/29/2022: BUN 13; Creatinine, Ser 0.71; Hemoglobin 12.9; Platelets 337; Potassium 3.5; Sodium 140  November 2023: Cholesterol 170, triglycerides 123, HDL 60, LDL 88, hemoglobin 13.9,  platelets 378  Other Studies Reviewed Today:  Coronary CTA 02/09/2022: FINDINGS: Image quality: Excellent.   Noise artifact is: Limited.   Coronary Arteries:  Normal coronary origin.  Right dominance.   Left main: The left main is a large caliber vessel with a normal take off from the left coronary cusp that bifurcates to form a left anterior descending artery and a left circumflex artery. There is no plaque or stenosis.   Left anterior descending artery: The LAD is patent without evidence of plaque or stenosis. The LAD gives off 1 patent diagonal branch.   Left circumflex artery: The LCX is non-dominant and patent with no evidence of plaque or stenosis. The LCX gives off 2 patent obtuse marginal branches.   Right coronary artery: The RCA is dominant with normal take off from the right coronary cusp. There is no evidence of plaque or stenosis. The RCA terminates as a PDA and right posterolateral branch without evidence of plaque or stenosis.   Right Atrium: Right atrial size is within normal limits.   Right Ventricle: The right ventricular cavity is within normal limits.   Left Atrium: Left atrial size is normal in size with no left atrial appendage filling defect.   Left Ventricle: The ventricular cavity size is within normal limits.   Pulmonary arteries: Normal in size without proximal filling defect.   Pulmonary veins: Normal pulmonary venous drainage.   Pericardium: Normal thickness without significant effusion or calcium present.   Cardiac valves: The aortic valve is trileaflet without significant calcification. The mitral valve is normal without significant calcification.   Aorta: Normal caliber without significant disease.   Extra-cardiac findings: See attached radiology report for non-cardiac structures.   IMPRESSION: 1. Coronary calcium score of 0.   2. Normal coronary origin with right dominance.   3. Normal coronary arteries.  Assessment and  Plan:  1.  Palpitations with previously documented brief episodes of SVT as well as atrial and ventricular ectopy.  Plan to increase Toprol-XL to 25 mg daily and observe.  2.  History of atypical chest pain and dyspnea on exertion.  Coronary CTA from June of last year was very reassuring with no significant CAD and calcium score of 0.  Medication Adjustments/Labs and Tests Ordered: Current medicines are reviewed at length with the patient today.  Concerns regarding medicines are outlined above.   Tests Ordered: No orders of the defined types were placed in this encounter.   Medication Changes: Meds ordered this encounter  Medications   metoprolol succinate (TOPROL XL) 25 MG 24 hr tablet    Sig: Take 1 tablet (25 mg total) by mouth daily.    Dispense:  90 tablet    Refill:  3    11/02/22 note dose  change    Disposition:  Follow up  1 year.  Signed, Satira Sark, MD, [facility] 11/02/2022 8:49 AM    [facility] at [facility] [address], [place], [place] [address] Phone: [phone]; Fax: [phone]

## 2022-11-02 ENCOUNTER — Encounter: Payer: Self-pay | Admitting: Cardiology

## 2022-11-02 ENCOUNTER — Ambulatory Visit: Payer: Medicare HMO | Attending: Cardiology | Admitting: Cardiology

## 2022-11-02 VITALS — BP 124/60 | HR 62 | Ht 62.0 in | Wt 152.0 lb

## 2022-11-02 DIAGNOSIS — R002 Palpitations: Secondary | ICD-10-CM | POA: Diagnosis not present

## 2022-11-02 MED ORDER — METOPROLOL SUCCINATE ER 25 MG PO TB24: 25.0000 mg | ORAL_TABLET | Freq: Every day | ORAL | 3 refills | Status: DC

## 2022-11-02 NOTE — Patient Instructions (Signed)
Medication Instructions:    Take Toprol XL 25 mg daily  Labwork: None today  Testing/Procedures: None today  Follow-Up: 1 year  Any Other Special Instructions Will Be Listed Below (If Applicable).  If you need a refill on your cardiac medications before your next appointment, please call your pharmacy.

## 2022-11-15 DIAGNOSIS — G894 Chronic pain syndrome: Secondary | ICD-10-CM | POA: Diagnosis not present

## 2022-11-15 DIAGNOSIS — M5136 Other intervertebral disc degeneration, lumbar region: Secondary | ICD-10-CM | POA: Diagnosis not present

## 2022-11-15 DIAGNOSIS — R109 Unspecified abdominal pain: Secondary | ICD-10-CM | POA: Diagnosis not present

## 2022-11-15 DIAGNOSIS — M5030 Other cervical disc degeneration, unspecified cervical region: Secondary | ICD-10-CM | POA: Diagnosis not present

## 2022-12-01 ENCOUNTER — Other Ambulatory Visit (HOSPITAL_COMMUNITY): Payer: Self-pay | Admitting: Internal Medicine

## 2022-12-01 DIAGNOSIS — R109 Unspecified abdominal pain: Secondary | ICD-10-CM

## 2022-12-18 DIAGNOSIS — D518 Other vitamin B12 deficiency anemias: Secondary | ICD-10-CM | POA: Diagnosis not present

## 2022-12-18 DIAGNOSIS — R109 Unspecified abdominal pain: Secondary | ICD-10-CM | POA: Diagnosis not present

## 2022-12-18 DIAGNOSIS — Z6827 Body mass index (BMI) 27.0-27.9, adult: Secondary | ICD-10-CM | POA: Diagnosis not present

## 2022-12-18 DIAGNOSIS — F411 Generalized anxiety disorder: Secondary | ICD-10-CM | POA: Diagnosis not present

## 2022-12-18 DIAGNOSIS — I700 Atherosclerosis of aorta: Secondary | ICD-10-CM | POA: Diagnosis not present

## 2022-12-18 DIAGNOSIS — M159 Polyosteoarthritis, unspecified: Secondary | ICD-10-CM | POA: Diagnosis not present

## 2022-12-18 DIAGNOSIS — F1120 Opioid dependence, uncomplicated: Secondary | ICD-10-CM | POA: Diagnosis not present

## 2022-12-18 DIAGNOSIS — G894 Chronic pain syndrome: Secondary | ICD-10-CM | POA: Diagnosis not present

## 2023-01-18 DIAGNOSIS — M26609 Unspecified temporomandibular joint disorder, unspecified side: Secondary | ICD-10-CM | POA: Diagnosis not present

## 2023-01-18 DIAGNOSIS — G894 Chronic pain syndrome: Secondary | ICD-10-CM | POA: Diagnosis not present

## 2023-01-18 DIAGNOSIS — M5030 Other cervical disc degeneration, unspecified cervical region: Secondary | ICD-10-CM | POA: Diagnosis not present

## 2023-01-18 DIAGNOSIS — F1120 Opioid dependence, uncomplicated: Secondary | ICD-10-CM | POA: Diagnosis not present

## 2023-01-18 DIAGNOSIS — M5136 Other intervertebral disc degeneration, lumbar region: Secondary | ICD-10-CM | POA: Diagnosis not present

## 2023-01-26 DIAGNOSIS — Z96651 Presence of right artificial knee joint: Secondary | ICD-10-CM | POA: Diagnosis not present

## 2023-02-02 ENCOUNTER — Ambulatory Visit (HOSPITAL_COMMUNITY): Payer: Medicare HMO

## 2023-02-20 DIAGNOSIS — M159 Polyosteoarthritis, unspecified: Secondary | ICD-10-CM | POA: Diagnosis not present

## 2023-02-20 DIAGNOSIS — F411 Generalized anxiety disorder: Secondary | ICD-10-CM | POA: Diagnosis not present

## 2023-02-20 DIAGNOSIS — F1120 Opioid dependence, uncomplicated: Secondary | ICD-10-CM | POA: Diagnosis not present

## 2023-02-20 DIAGNOSIS — M5136 Other intervertebral disc degeneration, lumbar region: Secondary | ICD-10-CM | POA: Diagnosis not present

## 2023-02-20 DIAGNOSIS — G894 Chronic pain syndrome: Secondary | ICD-10-CM | POA: Diagnosis not present

## 2023-03-20 DIAGNOSIS — G894 Chronic pain syndrome: Secondary | ICD-10-CM | POA: Diagnosis not present

## 2023-03-20 DIAGNOSIS — F1120 Opioid dependence, uncomplicated: Secondary | ICD-10-CM | POA: Diagnosis not present

## 2023-03-20 DIAGNOSIS — M5030 Other cervical disc degeneration, unspecified cervical region: Secondary | ICD-10-CM | POA: Diagnosis not present

## 2023-03-20 DIAGNOSIS — M5136 Other intervertebral disc degeneration, lumbar region: Secondary | ICD-10-CM | POA: Diagnosis not present

## 2023-03-20 DIAGNOSIS — G243 Spasmodic torticollis: Secondary | ICD-10-CM | POA: Diagnosis not present

## 2023-03-20 DIAGNOSIS — E663 Overweight: Secondary | ICD-10-CM | POA: Diagnosis not present

## 2023-03-20 DIAGNOSIS — Z6828 Body mass index (BMI) 28.0-28.9, adult: Secondary | ICD-10-CM | POA: Diagnosis not present

## 2023-03-20 DIAGNOSIS — F411 Generalized anxiety disorder: Secondary | ICD-10-CM | POA: Diagnosis not present

## 2023-03-26 ENCOUNTER — Other Ambulatory Visit (HOSPITAL_COMMUNITY): Payer: Self-pay | Admitting: Internal Medicine

## 2023-03-26 ENCOUNTER — Ambulatory Visit (HOSPITAL_COMMUNITY): Payer: Medicare HMO

## 2023-03-26 DIAGNOSIS — M5030 Other cervical disc degeneration, unspecified cervical region: Secondary | ICD-10-CM

## 2023-04-02 ENCOUNTER — Ambulatory Visit (HOSPITAL_BASED_OUTPATIENT_CLINIC_OR_DEPARTMENT_OTHER): Payer: Medicare HMO

## 2023-04-02 ENCOUNTER — Ambulatory Visit (HOSPITAL_BASED_OUTPATIENT_CLINIC_OR_DEPARTMENT_OTHER)
Admission: RE | Admit: 2023-04-02 | Discharge: 2023-04-02 | Disposition: A | Discharge status: Home / Self Care | Payer: Medicare HMO | Source: Ambulatory Visit | Attending: Internal Medicine | Admitting: Internal Medicine

## 2023-04-02 DIAGNOSIS — M5021 Other cervical disc displacement,  high cervical region: Secondary | ICD-10-CM | POA: Diagnosis not present

## 2023-04-02 DIAGNOSIS — M4802 Spinal stenosis, cervical region: Secondary | ICD-10-CM | POA: Diagnosis not present

## 2023-04-02 DIAGNOSIS — M5030 Other cervical disc degeneration, unspecified cervical region: Secondary | ICD-10-CM | POA: Insufficient documentation

## 2023-04-02 DIAGNOSIS — M4319 Spondylolisthesis, multiple sites in spine: Secondary | ICD-10-CM | POA: Diagnosis not present

## 2023-04-02 DIAGNOSIS — M5023 Other cervical disc displacement, cervicothoracic region: Secondary | ICD-10-CM | POA: Diagnosis not present

## 2023-04-07 ENCOUNTER — Ambulatory Visit (HOSPITAL_BASED_OUTPATIENT_CLINIC_OR_DEPARTMENT_OTHER)
Admission: RE | Admit: 2023-04-07 | Discharge: 2023-04-07 | Disposition: A | Discharge status: Home / Self Care | Payer: Medicare HMO | Source: Ambulatory Visit | Attending: Internal Medicine | Admitting: Internal Medicine

## 2023-04-07 DIAGNOSIS — R109 Unspecified abdominal pain: Secondary | ICD-10-CM | POA: Diagnosis not present

## 2023-04-07 DIAGNOSIS — K5730 Diverticulosis of large intestine without perforation or abscess without bleeding: Secondary | ICD-10-CM | POA: Diagnosis not present

## 2023-04-07 LAB — POCT I-STAT CREATININE: Creatinine, Ser: 0.7 mg/dL (ref 0.44–1.00)

## 2023-04-07 MED ORDER — IOHEXOL 300 MG/ML  SOLN
100.0000 mL | Freq: Once | INTRAMUSCULAR | Status: AC | PRN
  Administered 2023-04-07: 80 mL via INTRAVENOUS

## 2023-04-13 DIAGNOSIS — I671 Cerebral aneurysm, nonruptured: Secondary | ICD-10-CM | POA: Diagnosis not present

## 2023-04-13 DIAGNOSIS — S46012A Strain of muscle(s) and tendon(s) of the rotator cuff of left shoulder, initial encounter: Secondary | ICD-10-CM | POA: Diagnosis not present

## 2023-04-13 DIAGNOSIS — F411 Generalized anxiety disorder: Secondary | ICD-10-CM | POA: Diagnosis not present

## 2023-04-13 DIAGNOSIS — D518 Other vitamin B12 deficiency anemias: Secondary | ICD-10-CM | POA: Diagnosis not present

## 2023-04-13 DIAGNOSIS — M5136 Other intervertebral disc degeneration, lumbar region: Secondary | ICD-10-CM | POA: Diagnosis not present

## 2023-04-13 DIAGNOSIS — M5030 Other cervical disc degeneration, unspecified cervical region: Secondary | ICD-10-CM | POA: Diagnosis not present

## 2023-04-13 DIAGNOSIS — I872 Venous insufficiency (chronic) (peripheral): Secondary | ICD-10-CM | POA: Diagnosis not present

## 2023-04-13 DIAGNOSIS — G894 Chronic pain syndrome: Secondary | ICD-10-CM | POA: Diagnosis not present

## 2023-04-13 DIAGNOSIS — Z6828 Body mass index (BMI) 28.0-28.9, adult: Secondary | ICD-10-CM | POA: Diagnosis not present

## 2023-05-01 ENCOUNTER — Ambulatory Visit: Payer: Medicare HMO | Admitting: Internal Medicine

## 2023-05-01 ENCOUNTER — Encounter: Payer: Self-pay | Admitting: Internal Medicine

## 2023-05-01 VITALS — BP 126/79 | HR 63 | Temp 97.9°F | Ht 62.0 in | Wt 155.4 lb

## 2023-05-01 DIAGNOSIS — R1319 Other dysphagia: Secondary | ICD-10-CM

## 2023-05-01 DIAGNOSIS — K5900 Constipation, unspecified: Secondary | ICD-10-CM

## 2023-05-01 DIAGNOSIS — K219 Gastro-esophageal reflux disease without esophagitis: Secondary | ICD-10-CM

## 2023-05-01 NOTE — Progress Notes (Unsigned)
Primary Care Physician:  Elfredia Nevins, MD Primary Gastroenterologist:  Dr. Jena Gauss  Pre-Procedure History & Physical: HPI:  Gina Wolfe is a 67 y.o. female here for further evaluation of constipation on chronic opioids you for musculoskeletal issues.  Amitiza did not work previously for constipation.  Takes Linzess 290 only sporadically has a bowel movement 1-2 times weekly feels feels bloated as she points out in the distribution of her colon.  Has not had any rectal bleeding colonoscopy 2022 demonstrated diverticulosis only. PCP obtained a CT 3 weeks ago which revealed only diverticulosis.  Has GERD frequently over 3 times a week in spite of taking Protonix 40 mg daily occasional esophageal dysphagia to solid food.  No prior EGD.  Past Medical History:  Diagnosis Date   Arthritis    Asthma    Chronic pain syndrome    Diverticulitis    GERD (gastroesophageal reflux disease)    History of palpitations    Hypertension    IBS (irritable bowel syndrome)    PONV (postoperative nausea and vomiting)    Spinal headache     Past Surgical History:  Procedure Laterality Date   ABDOMINAL HYSTERECTOMY     BACK SURGERY     x3 previous lumbar, x3 cervical surgeries   CHOLECYSTECTOMY     COLONOSCOPY  05/20/10   MVH:QIONGEXB hemorrhoids otherwise normal/EGD:distal esophageal erosions otherwise normal   COLONOSCOPY WITH PROPOFOL N/A 08/15/2021   Procedure: COLONOSCOPY WITH PROPOFOL;  Surgeon: Corbin Ade, MD;  [facility];  Service: Endoscopy;  Laterality: N/A;  7:30am   CYST EXCISION Right    Eye cyst   EYE SURGERY Right    removed a cyst   IR ANGIO INTRA EXTRACRAN SEL COM CAROTID INNOMINATE UNI L MOD SED  09/29/2022   IR ANGIO INTRA EXTRACRAN SEL INTERNAL CAROTID BILAT MOD SED  02/21/2022   IR ANGIO INTRA EXTRACRAN SEL INTERNAL CAROTID UNI R MOD SED  03/27/2022   IR ANGIO INTRA EXTRACRAN SEL INTERNAL CAROTID UNI R MOD SED  09/29/2022   IR ANGIO VERTEBRAL SEL VERTEBRAL UNI R  MOD SED  02/21/2022   IR ANGIOGRAM FOLLOW UP STUDY  03/27/2022   IR NEURO EACH ADD'L AFTER BASIC UNI RIGHT (MS)  03/27/2022   IR TRANSCATH/EMBOLIZ  03/24/2022   IR US GUIDE VASC ACCESS RIGHT  09/29/2022   LUMBAR LAMINECTOMY/DECOMPRESSION MICRODISCECTOMY N/A 07/13/2016   Procedure: DISCECTOMY Lumbar four-five;  Surgeon: Tressie Stalker, MD;  Location: [facility] OR;  Service: Neurosurgery;  Laterality: N/A;   MENISCUS REPAIR  2007   R knee    POSTERIOR CERVICAL FUSION/FORAMINOTOMY N/A 04/10/2013   Procedure: POSTERIOR CERVICAL FUSION/FORAMINOTOMY LEVEL 2;  Surgeon: Emilee Hero, MD;  Location: [facility] OR;  Service: Orthopedics;  Laterality: N/A;  Posterior cervical decompression fusion, cervical 5-6, cervical 6-7 with instrumentation and allograft.   RADIOLOGY WITH ANESTHESIA Right 03/24/2022   Procedure: Pipeline Embolization of Right ICA Aneurysm;  Surgeon: Lisbeth Renshaw, MD;  Location: [facility] OR;  Service: Radiology;  Laterality: Right;   TOTAL KNEE ARTHROPLASTY Right 01/03/2021   Procedure: TOTAL KNEE ARTHROPLASTY;  Surgeon: Ollen Gross, MD;  Location: [facility] ORS;  Service: Orthopedics;  Laterality: Right;    TUBAL LIGATION      Prior to Admission medications   Medication Sig Start Date End Date Taking? Authorizing Provider  albuterol (PROVENTIL HFA;VENTOLIN HFA) 108 (90 Base) MCG/ACT inhaler Inhale 2 puffs into the lungs every 6 (six) hours as needed for wheezing or shortness of breath.  Yes [provider]  aspirin EC 81 MG tablet Take 81 mg by mouth daily. Swallow whole.   Yes [provider]  cholecalciferol (VITAMIN D3) 25 MCG (1000 UNIT) tablet Take 1,000 Units by mouth daily.   Yes [provider]  estrogens, conjugated, (PREMARIN) 0.625 MG tablet Take 0.625 mg by mouth daily with breakfast.    Yes [provider]  furosemide (LASIX) 20 MG tablet Take 20 mg by mouth daily as needed for edema.    Yes [provider]  linaclotide (LINZESS) 290  MCG CAPS capsule Take 290 mcg by mouth daily.   Yes [provider]  metoprolol succinate (TOPROL XL) 25 MG 24 hr tablet Take 1 tablet (25 mg total) by mouth daily. 11/02/22  Yes Jonelle Sidle, MD  ondansetron (ZOFRAN-ODT) 8 MG disintegrating tablet Take 1 tablet (8 mg total) by mouth every 8 (eight) hours as needed for nausea or vomiting. 05/26/22  Yes Linwood Dibbles, MD  oxyCODONE-acetaminophen (PERCOCET) 10-325 MG tablet Take 1 tablet by mouth every 4 (four) hours as needed for pain. 05/02/21  Yes [provider]  pantoprazole (PROTONIX) 40 MG tablet Take 1 tablet (40 mg total) by mouth daily. 05/26/22 05/01/23 Yes Linwood Dibbles, MD  promethazine (PHENERGAN) 25 MG tablet Take 25 mg by mouth every 6 (six) hours as needed for nausea or vomiting.   Yes [provider]    Allergies as of 05/01/2023 - Review Complete 05/01/2023  Allergen Reaction Noted   Clindamycin hcl Anaphylaxis    Codeine Nausea Only     Family History  Problem Relation Age of Onset   Hypertension Mother    Emphysema Mother    Cancer Father    Hypertension Other    Diabetes Other    Colon cancer Neg Hx    Colon polyps Neg Hx     Social History   Socioeconomic History   Marital status: Married    Spouse name: Not on file   Number of children: 3   Years of education: Not on file   Highest education level: Not on file  Occupational History   Not on file  Tobacco Use   Smoking status: Former    Current packs/day: 0.00    Types: Cigarettes    Quit date: 08/25/2007    Years since quitting: 15.6   Smokeless tobacco: Never  Vaping Use   Vaping status: Never Used  Substance and Sexual Activity   Alcohol use: No    Alcohol/week: 0.0 standard drinks of alcohol   Drug use: No   Sexual activity: Yes  Other Topics Concern   Not on file  Social History Narrative   Not on file   Social Determinants of Health   Financial Resource Strain: Not on file  Food Insecurity: Not on file   Transportation Needs: Not on file  Physical Activity: Not on file  Stress: Not on file  Social Connections: Not on file  Intimate Partner Violence: Not on file    Review of Systems: See HPI, otherwise negative ROS  Physical Exam: BP 126/79 (BP Location: Right Arm, Patient Position: Sitting, Cuff Size: Normal)   Pulse 63   Temp 97.9 F (36.6 C) (Oral)   Ht 5\' 2"  (1.575 m)   Wt 155 lb 6.4 oz (70.5 kg)   SpO2 98%   BMI 28.42 kg/m  General:   Alert,  Well-developed, well-nourished, pleasant and cooperative in NAD Neck:  Supple; no masses or thyromegaly. No significant cervical adenopathy.  Lungs:  Clear throughout to auscultation.   No wheezes, crackles, or rhonchi. No acute distress. Heart:  Regular rate and rhythm; no murmurs, clicks, rubs,  or gallops. Abdomen: Non-distended, normal bowel sounds.  Soft and nontender without appreciable mass or hepatosplenomegaly.    Impression/Plan: 67 year old lady with chronic constipation setting of daily opioid therapy constipation and adequately treated.  Takes Linzess only sporadically.  Up-to-date on colonoscopy.  Nothing on CT to suggest other causes of abdominal pain.  Longstanding GERD poorly controlled on once daily PPI.  She has esophageal dysphagia.  She needs further evaluation.  Recommendations:  Linzess 290 every day for a month and we will reassess.  She may need to be switched out to Movantik.  Proceed with an EGD with possible esophageal dilation. The risks, benefits, limitations, alternatives and imponderables have been reviewed with the patient. Potential for esophageal dilation, biopsy, etc. have also been reviewed.  Questions have been answered. All parties agreeable.   Edition n.p.o. past midnight she needs to take only a clear liquid supper prior to the procedure.  Further recommendations to follow.   Notice: This dictation was prepared with Dragon dictation along with smaller phrase technology. Any transcriptional  errors that result from this process are unintentional and may not be corrected upon review.

## 2023-05-01 NOTE — Patient Instructions (Signed)
It was good to see you again today!  Constipation is a chronic condition which requires treatment regularly not as needed  Take Linzess 290 every day for the next month without fail and then we will reassess its effects on constipation  Continue Protonix 40 mg daily  Because you have reflux symptoms more than 3 times a week on Protonix and have trouble swallowing from time to time we will pursue an EGD with esophageal dilation is feasible/appropriate per plan.  ASA 3.  Nothing to eat or drink after midnight the night before and only a clear liquid supper the night before.  Further recommendations to follow.

## 2023-05-16 DIAGNOSIS — M26609 Unspecified temporomandibular joint disorder, unspecified side: Secondary | ICD-10-CM | POA: Diagnosis not present

## 2023-05-16 DIAGNOSIS — M5412 Radiculopathy, cervical region: Secondary | ICD-10-CM | POA: Diagnosis not present

## 2023-05-16 DIAGNOSIS — I872 Venous insufficiency (chronic) (peripheral): Secondary | ICD-10-CM | POA: Diagnosis not present

## 2023-05-16 DIAGNOSIS — G453 Amaurosis fugax: Secondary | ICD-10-CM | POA: Diagnosis not present

## 2023-05-17 ENCOUNTER — Other Ambulatory Visit (HOSPITAL_COMMUNITY): Payer: Self-pay | Admitting: Internal Medicine

## 2023-05-17 DIAGNOSIS — G453 Amaurosis fugax: Secondary | ICD-10-CM

## 2023-05-19 ENCOUNTER — Ambulatory Visit (HOSPITAL_BASED_OUTPATIENT_CLINIC_OR_DEPARTMENT_OTHER)
Admission: RE | Admit: 2023-05-19 | Discharge: 2023-05-19 | Disposition: A | Discharge status: Home / Self Care | Payer: Medicare HMO | Source: Ambulatory Visit | Attending: Internal Medicine | Admitting: Internal Medicine

## 2023-05-19 DIAGNOSIS — G453 Amaurosis fugax: Secondary | ICD-10-CM | POA: Insufficient documentation

## 2023-05-19 DIAGNOSIS — I6502 Occlusion and stenosis of left vertebral artery: Secondary | ICD-10-CM | POA: Diagnosis not present

## 2023-05-19 DIAGNOSIS — I700 Atherosclerosis of aorta: Secondary | ICD-10-CM | POA: Diagnosis not present

## 2023-05-19 MED ORDER — IOHEXOL 350 MG/ML SOLN
100.0000 mL | Freq: Once | INTRAVENOUS | Status: AC | PRN
  Administered 2023-05-19: 75 mL via INTRAVENOUS

## 2023-05-23 ENCOUNTER — Ambulatory Visit (HOSPITAL_BASED_OUTPATIENT_CLINIC_OR_DEPARTMENT_OTHER): Payer: Medicare HMO

## 2023-05-23 ENCOUNTER — Emergency Department (HOSPITAL_COMMUNITY): Payer: Medicare HMO

## 2023-05-23 ENCOUNTER — Other Ambulatory Visit: Payer: Self-pay

## 2023-05-23 ENCOUNTER — Encounter (HOSPITAL_COMMUNITY): Payer: Self-pay

## 2023-05-23 ENCOUNTER — Observation Stay (HOSPITAL_COMMUNITY)
Admission: EM | Admit: 2023-05-23 | Discharge: 2023-05-24 | Disposition: A | Discharge status: Home / Self Care | Payer: Medicare HMO | Attending: Family Medicine | Admitting: Family Medicine

## 2023-05-23 DIAGNOSIS — R140 Abdominal distension (gaseous): Secondary | ICD-10-CM | POA: Diagnosis not present

## 2023-05-23 DIAGNOSIS — R109 Unspecified abdominal pain: Secondary | ICD-10-CM | POA: Diagnosis not present

## 2023-05-23 DIAGNOSIS — K529 Noninfective gastroenteritis and colitis, unspecified: Secondary | ICD-10-CM | POA: Diagnosis not present

## 2023-05-23 DIAGNOSIS — K5730 Diverticulosis of large intestine without perforation or abscess without bleeding: Secondary | ICD-10-CM | POA: Diagnosis not present

## 2023-05-23 DIAGNOSIS — Z79899 Other long term (current) drug therapy: Secondary | ICD-10-CM | POA: Diagnosis not present

## 2023-05-23 DIAGNOSIS — Z981 Arthrodesis status: Secondary | ICD-10-CM | POA: Diagnosis not present

## 2023-05-23 DIAGNOSIS — Z87891 Personal history of nicotine dependence: Secondary | ICD-10-CM | POA: Insufficient documentation

## 2023-05-23 DIAGNOSIS — F1190 Opioid use, unspecified, uncomplicated: Secondary | ICD-10-CM | POA: Diagnosis present

## 2023-05-23 DIAGNOSIS — R079 Chest pain, unspecified: Secondary | ICD-10-CM | POA: Diagnosis not present

## 2023-05-23 DIAGNOSIS — I10 Essential (primary) hypertension: Secondary | ICD-10-CM | POA: Diagnosis not present

## 2023-05-23 DIAGNOSIS — K219 Gastro-esophageal reflux disease without esophagitis: Secondary | ICD-10-CM | POA: Diagnosis present

## 2023-05-23 DIAGNOSIS — Z96651 Presence of right artificial knee joint: Secondary | ICD-10-CM | POA: Diagnosis not present

## 2023-05-23 DIAGNOSIS — D72829 Elevated white blood cell count, unspecified: Secondary | ICD-10-CM | POA: Diagnosis not present

## 2023-05-23 DIAGNOSIS — R1032 Left lower quadrant pain: Secondary | ICD-10-CM | POA: Diagnosis not present

## 2023-05-23 DIAGNOSIS — M1711 Unilateral primary osteoarthritis, right knee: Secondary | ICD-10-CM | POA: Diagnosis present

## 2023-05-23 DIAGNOSIS — R1013 Epigastric pain: Principal | ICD-10-CM

## 2023-05-23 DIAGNOSIS — K838 Other specified diseases of biliary tract: Secondary | ICD-10-CM | POA: Clinically undetermined

## 2023-05-23 DIAGNOSIS — Z7982 Long term (current) use of aspirin: Secondary | ICD-10-CM | POA: Insufficient documentation

## 2023-05-23 DIAGNOSIS — J45909 Unspecified asthma, uncomplicated: Secondary | ICD-10-CM | POA: Insufficient documentation

## 2023-05-23 DIAGNOSIS — I671 Cerebral aneurysm, nonruptured: Secondary | ICD-10-CM | POA: Diagnosis present

## 2023-05-23 LAB — URINALYSIS, ROUTINE W REFLEX MICROSCOPIC
Bilirubin Urine: NEGATIVE
Glucose, UA: NEGATIVE mg/dL
Hgb urine dipstick: NEGATIVE
Ketones, ur: NEGATIVE mg/dL
Leukocytes,Ua: NEGATIVE
Nitrite: NEGATIVE
Protein, ur: NEGATIVE mg/dL
Specific Gravity, Urine: >1.046 — ABNORMAL HIGH (ref 1.005–1.030)
pH: 5 (ref 5.0–8.0)

## 2023-05-23 LAB — COMPREHENSIVE METABOLIC PANEL
ALT: 28 U/L (ref 0–44)
AST: 24 U/L (ref 15–41)
Albumin: 4.2 g/dL (ref 3.5–5.0)
Alkaline Phosphatase: 47 U/L (ref 38–126)
Anion gap: 12 (ref 5–15)
BUN: 18 mg/dL (ref 8–23)
CO2: 30 mmol/L (ref 22–32)
Calcium: 9.1 mg/dL (ref 8.9–10.3)
Chloride: 93 mmol/L — ABNORMAL LOW (ref 98–111)
Creatinine, Ser: 0.88 mg/dL (ref 0.44–1.00)
GFR, Estimated: >60 mL/min (ref >60)
Glucose, Bld: 122 mg/dL — ABNORMAL HIGH (ref 70–99)
Potassium: 3.5 mmol/L (ref 3.5–5.1)
Sodium: 135 mmol/L (ref 135–145)
Total Bilirubin: 0.8 mg/dL (ref 0.3–1.2)
Total Protein: 7.4 g/dL (ref 6.5–8.1)

## 2023-05-23 LAB — MAGNESIUM: Magnesium: 1.9 mg/dL (ref 1.7–2.4)

## 2023-05-23 LAB — CBC
HCT: 50.5 % — ABNORMAL HIGH (ref 36.0–46.0)
Hemoglobin: 16.7 g/dL — ABNORMAL HIGH (ref 12.0–15.0)
MCH: 30.5 pg (ref 26.0–34.0)
MCHC: 33.1 g/dL (ref 30.0–36.0)
MCV: 92.3 fL (ref 80.0–100.0)
Platelets: 445 10*3/uL — ABNORMAL HIGH (ref 150–400)
RBC: 5.47 MIL/uL — ABNORMAL HIGH (ref 3.87–5.11)
RDW: 12.4 % (ref 11.5–15.5)
WBC: 20.4 10*3/uL — ABNORMAL HIGH (ref 4.0–10.5)
nRBC: 0 % (ref 0.0–0.2)

## 2023-05-23 LAB — PHOSPHORUS: Phosphorus: 3.7 mg/dL (ref 2.5–4.6)

## 2023-05-23 LAB — TROPONIN I (HIGH SENSITIVITY)
Troponin I (High Sensitivity): 2 ng/L (ref <18)
Troponin I (High Sensitivity): 2 ng/L (ref <18)

## 2023-05-23 LAB — LIPASE, BLOOD: Lipase: 27 U/L (ref 11–51)

## 2023-05-23 MED ORDER — ACETAMINOPHEN 325 MG PO TABS: 650.0000 mg | ORAL_TABLET | Freq: Four times a day (QID) | ORAL | Status: DC | PRN

## 2023-05-23 MED ORDER — PROMETHAZINE HCL 12.5 MG PO TABS: 25.0000 mg | ORAL_TABLET | Freq: Four times a day (QID) | ORAL | Status: DC | PRN

## 2023-05-23 MED ORDER — CIPROFLOXACIN IN D5W 400 MG/200ML IV SOLN
400.0000 mg | Freq: Two times a day (BID) | INTRAVENOUS | Status: DC
  Administered 2023-05-23 – 2023-05-24 (×2): 400 mg via INTRAVENOUS
  Filled 2023-05-23 (×2): qty 200

## 2023-05-23 MED ORDER — HEPARIN SODIUM (PORCINE) 5000 UNIT/ML IJ SOLN: 5000.0000 [IU] | Freq: Three times a day (TID) | INTRAMUSCULAR | Status: DC

## 2023-05-23 MED ORDER — VITAMIN D 25 MCG (1000 UNIT) PO TABS
1000.0000 [IU] | ORAL_TABLET | Freq: Every day | ORAL | Status: DC
  Administered 2023-05-24: 1000 [IU] via ORAL
  Filled 2023-05-23: qty 1

## 2023-05-23 MED ORDER — ONDANSETRON HCL 4 MG/2ML IJ SOLN
4.0000 mg | Freq: Once | INTRAMUSCULAR | Status: AC
  Administered 2023-05-23: 4 mg via INTRAVENOUS
  Filled 2023-05-23: qty 2

## 2023-05-23 MED ORDER — HYDROMORPHONE HCL 1 MG/ML IJ SOLN
1.0000 mg | Freq: Once | INTRAMUSCULAR | Status: AC
  Administered 2023-05-23: 1 mg via INTRAVENOUS
  Filled 2023-05-23: qty 1

## 2023-05-23 MED ORDER — FAMOTIDINE IN NACL 20-0.9 MG/50ML-% IV SOLN
20.0000 mg | Freq: Once | INTRAVENOUS | Status: AC
  Administered 2023-05-23: 20 mg via INTRAVENOUS
  Filled 2023-05-23: qty 50

## 2023-05-23 MED ORDER — LINACLOTIDE 145 MCG PO CAPS
290.0000 ug | ORAL_CAPSULE | Freq: Every day | ORAL | Status: DC
  Administered 2023-05-24: 290 ug via ORAL
  Filled 2023-05-23: qty 2

## 2023-05-23 MED ORDER — SODIUM CHLORIDE 0.9% FLUSH: 3.0000 mL | Freq: Two times a day (BID) | INTRAVENOUS | Status: DC

## 2023-05-23 MED ORDER — CIPROFLOXACIN IN D5W 400 MG/200ML IV SOLN
400.0000 mg | Freq: Once | INTRAVENOUS | Status: AC
  Administered 2023-05-23: 400 mg via INTRAVENOUS
  Filled 2023-05-23: qty 200

## 2023-05-23 MED ORDER — SENNOSIDES-DOCUSATE SODIUM 8.6-50 MG PO TABS: 1.0000 | ORAL_TABLET | Freq: Every evening | ORAL | Status: DC | PRN

## 2023-05-23 MED ORDER — FLEET ENEMA RE ENEM: 1.0000 | ENEMA | Freq: Once | RECTAL | Status: DC | PRN

## 2023-05-23 MED ORDER — METOPROLOL SUCCINATE ER 25 MG PO TB24
25.0000 mg | ORAL_TABLET | Freq: Every day | ORAL | Status: DC
  Administered 2023-05-23: 25 mg via ORAL
  Filled 2023-05-23: qty 1

## 2023-05-23 MED ORDER — ESTROGENS CONJUGATED 0.625 MG PO TABS
0.6250 mg | ORAL_TABLET | Freq: Every day | ORAL | Status: DC
  Administered 2023-05-24: 0.625 mg via ORAL
  Filled 2023-05-23 (×2): qty 1

## 2023-05-23 MED ORDER — PANTOPRAZOLE SODIUM 40 MG IV SOLR
40.0000 mg | Freq: Two times a day (BID) | INTRAVENOUS | Status: DC
  Administered 2023-05-23 – 2023-05-24 (×2): 40 mg via INTRAVENOUS
  Filled 2023-05-23 (×2): qty 10

## 2023-05-23 MED ORDER — MORPHINE SULFATE (PF) 4 MG/ML IV SOLN
4.0000 mg | Freq: Once | INTRAVENOUS | Status: AC
  Administered 2023-05-23: 4 mg via INTRAVENOUS
  Filled 2023-05-23: qty 1

## 2023-05-23 MED ORDER — ONDANSETRON HCL 4 MG/2ML IJ SOLN
4.0000 mg | Freq: Four times a day (QID) | INTRAMUSCULAR | Status: DC | PRN
  Administered 2023-05-23: 4 mg via INTRAVENOUS
  Filled 2023-05-23: qty 2

## 2023-05-23 MED ORDER — SODIUM CHLORIDE 0.9 % IV SOLN: 250.0000 mL | INTRAVENOUS | Status: DC | PRN

## 2023-05-23 MED ORDER — SODIUM CHLORIDE 0.9% FLUSH: 3.0000 mL | INTRAVENOUS | Status: DC | PRN

## 2023-05-23 MED ORDER — SODIUM CHLORIDE 0.9 % IV BOLUS
1000.0000 mL | Freq: Once | INTRAVENOUS | Status: AC
  Administered 2023-05-23: 1000 mL via INTRAVENOUS

## 2023-05-23 MED ORDER — ACETAMINOPHEN 650 MG RE SUPP: 650.0000 mg | Freq: Four times a day (QID) | RECTAL | Status: DC | PRN

## 2023-05-23 MED ORDER — IOHEXOL 300 MG/ML  SOLN
100.0000 mL | Freq: Once | INTRAMUSCULAR | Status: AC | PRN
  Administered 2023-05-23: 100 mL via INTRAVENOUS

## 2023-05-23 MED ORDER — RISAQUAD PO CAPS
2.0000 | ORAL_CAPSULE | Freq: Three times a day (TID) | ORAL | Status: DC
  Administered 2023-05-23 – 2023-05-24 (×3): 2 via ORAL
  Filled 2023-05-23 (×3): qty 2

## 2023-05-23 MED ORDER — SODIUM CHLORIDE 0.9% FLUSH
3.0000 mL | Freq: Two times a day (BID) | INTRAVENOUS | Status: DC
  Administered 2023-05-23 – 2023-05-24 (×2): 3 mL via INTRAVENOUS

## 2023-05-23 MED ORDER — ASPIRIN 81 MG PO TBEC
81.0000 mg | DELAYED_RELEASE_TABLET | Freq: Every day | ORAL | Status: DC
  Administered 2023-05-23 – 2023-05-24 (×2): 81 mg via ORAL
  Filled 2023-05-23 (×2): qty 1

## 2023-05-23 MED ORDER — OXYCODONE HCL 5 MG PO TABS: 5.0000 mg | ORAL_TABLET | ORAL | Status: DC | PRN

## 2023-05-23 MED ORDER — SUCRALFATE 1 GM/10ML PO SUSP
1.0000 g | Freq: Three times a day (TID) | ORAL | Status: DC
  Administered 2023-05-23 – 2023-05-24 (×4): 1 g via ORAL
  Filled 2023-05-23 (×4): qty 10

## 2023-05-23 MED ORDER — PANTOPRAZOLE SODIUM 40 MG PO TBEC: 40.0000 mg | DELAYED_RELEASE_TABLET | Freq: Every day | ORAL | Status: DC

## 2023-05-23 MED ORDER — ONDANSETRON HCL 4 MG PO TABS: 4.0000 mg | ORAL_TABLET | Freq: Four times a day (QID) | ORAL | Status: DC | PRN

## 2023-05-23 MED ORDER — IPRATROPIUM BROMIDE 0.02 % IN SOLN: 0.5000 mg | Freq: Four times a day (QID) | RESPIRATORY_TRACT | Status: DC | PRN

## 2023-05-23 MED ORDER — HYDRALAZINE HCL 20 MG/ML IJ SOLN: 10.0000 mg | INTRAMUSCULAR | Status: DC | PRN

## 2023-05-23 MED ORDER — LEVALBUTEROL HCL 0.63 MG/3ML IN NEBU: 0.6300 mg | INHALATION_SOLUTION | Freq: Four times a day (QID) | RESPIRATORY_TRACT | Status: DC | PRN

## 2023-05-23 MED ORDER — BISACODYL 5 MG PO TBEC: 5.0000 mg | DELAYED_RELEASE_TABLET | Freq: Every day | ORAL | Status: DC | PRN

## 2023-05-23 MED ORDER — TRAZODONE HCL 50 MG PO TABS: 25.0000 mg | ORAL_TABLET | Freq: Every evening | ORAL | Status: DC | PRN

## 2023-05-23 MED ORDER — METRONIDAZOLE 500 MG/100ML IV SOLN
500.0000 mg | Freq: Three times a day (TID) | INTRAVENOUS | Status: DC
  Administered 2023-05-23 – 2023-05-24 (×2): 500 mg via INTRAVENOUS
  Filled 2023-05-23 (×2): qty 100

## 2023-05-23 MED ORDER — SODIUM CHLORIDE 0.9 % IV SOLN: INTRAVENOUS | Status: AC

## 2023-05-23 MED ORDER — METRONIDAZOLE 500 MG/100ML IV SOLN
500.0000 mg | Freq: Once | INTRAVENOUS | Status: AC
  Administered 2023-05-23: 500 mg via INTRAVENOUS
  Filled 2023-05-23: qty 100

## 2023-05-23 MED ORDER — HYDROMORPHONE HCL 1 MG/ML IJ SOLN: 0.5000 mg | INTRAMUSCULAR | Status: DC | PRN

## 2023-05-23 NOTE — ED Provider Notes (Signed)
[facility] Emergency Department Provider Note MRN:  [id number]  Arrival date & time: 05/23/23     Chief Complaint   Abdominal Pain   History of Present Illness   Gina Wolfe is a 67 y.o. year-old female with a history of hypertension presenting to the ED with chief complaint of abdominal pain.  Severe pain in the lower chest and upper abdomen this morning associated with nausea vomiting, funny feeling in the left arm.  Review of Systems  A thorough review of systems was obtained and all systems are negative except as noted in the HPI and PMH.   Patient's Health History    Past Medical History:  Diagnosis Date   Arthritis    Asthma    Chronic pain syndrome    Diverticulitis    GERD (gastroesophageal reflux disease)    History of palpitations    Hypertension    IBS (irritable bowel syndrome)    PONV (postoperative nausea and vomiting)    Spinal headache     Past Surgical History:  Procedure Laterality Date   ABDOMINAL HYSTERECTOMY     BACK SURGERY     x3 previous lumbar, x3 cervical surgeries   CHOLECYSTECTOMY     COLONOSCOPY  05/20/10   WUJ:WJXBJYNW hemorrhoids otherwise normal/EGD:distal esophageal erosions otherwise normal   COLONOSCOPY WITH PROPOFOL N/A 08/15/2021   Procedure: COLONOSCOPY WITH PROPOFOL;  Surgeon: Corbin Ade, MD;  [facility];  Service: Endoscopy;  Laterality: N/A;  7:30am   CYST EXCISION Right    Eye cyst   EYE SURGERY Right    removed a cyst   IR ANGIO INTRA EXTRACRAN SEL COM CAROTID INNOMINATE UNI L MOD SED  09/29/2022   IR ANGIO INTRA EXTRACRAN SEL INTERNAL CAROTID BILAT MOD SED  02/21/2022   IR ANGIO INTRA EXTRACRAN SEL INTERNAL CAROTID UNI R MOD SED  03/27/2022   IR ANGIO INTRA EXTRACRAN SEL INTERNAL CAROTID UNI R MOD SED  09/29/2022   IR ANGIO VERTEBRAL SEL VERTEBRAL UNI R MOD SED  02/21/2022   IR ANGIOGRAM FOLLOW UP STUDY  03/27/2022   IR NEURO EACH ADD'L AFTER BASIC UNI RIGHT (MS)  03/27/2022   IR  TRANSCATH/EMBOLIZ  03/24/2022   IR US GUIDE VASC ACCESS RIGHT  09/29/2022   LUMBAR LAMINECTOMY/DECOMPRESSION MICRODISCECTOMY N/A 07/13/2016   Procedure: DISCECTOMY Lumbar four-five;  Surgeon: Tressie Stalker, MD;  Location: [facility] OR;  Service: Neurosurgery;  Laterality: N/A;   MENISCUS REPAIR  2007   R knee    POSTERIOR CERVICAL FUSION/FORAMINOTOMY N/A 04/10/2013   Procedure: POSTERIOR CERVICAL FUSION/FORAMINOTOMY LEVEL 2;  Surgeon: Emilee Hero, MD;  Location: [facility] OR;  Service: Orthopedics;  Laterality: N/A;  Posterior cervical decompression fusion, cervical 5-6, cervical 6-7 with instrumentation and allograft.   RADIOLOGY WITH ANESTHESIA Right 03/24/2022   Procedure: Pipeline Embolization of Right ICA Aneurysm;  Surgeon: Lisbeth Renshaw, MD;  Location: [facility] OR;  Service: Radiology;  Laterality: Right;   TOTAL KNEE ARTHROPLASTY Right 01/03/2021   Procedure: TOTAL KNEE ARTHROPLASTY;  Surgeon: Ollen Gross, MD;  Location: [facility] ORS;  Service: Orthopedics;  Laterality: Right;    TUBAL LIGATION      Family History  Problem Relation Age of Onset   Hypertension Mother    Emphysema Mother    Cancer Father    Hypertension Other    Diabetes Other    Colon cancer Neg Hx    Colon polyps Neg Hx     Social History   Socioeconomic History  Marital status: Married    Spouse name: Not on file   Number of children: 3   Years of education: Not on file   Highest education level: Not on file  Occupational History   Not on file  Tobacco Use   Smoking status: Former    Current packs/day: 0.00    Types: Cigarettes    Quit date: 08/25/2007    Years since quitting: 15.7   Smokeless tobacco: Never  Vaping Use   Vaping status: Never Used  Substance and Sexual Activity   Alcohol use: No    Alcohol/week: 0.0 standard drinks of alcohol   Drug use: No   Sexual activity: Yes  Other Topics Concern   Not on file  Social History Narrative   Not on file   Social Determinants of Health    Financial Resource Strain: Not on file  Food Insecurity: Not on file  Transportation Needs: Not on file  Physical Activity: Not on file  Stress: Not on file  Social Connections: Not on file  Intimate Partner Violence: Not on file     Physical Exam   Vitals:   05/23/23 0612 05/23/23 0632  BP: (!) 136/99   Pulse: 78   Resp: 19   Temp:  97.6 F (36.4 C)  SpO2: 99%     CONSTITUTIONAL: Well-appearing, NAD NEURO/PSYCH:  Alert and oriented x 3, no focal deficits EYES:  eyes equal and reactive ENT/NECK:  no LAD, no JVD CARDIO: Regular rate, well-perfused, normal S1 and S2 PULM:  CTAB no wheezing or rhonchi GI/GU:  non-distended, non-tender MSK/SPINE:  No gross deformities, no edema SKIN:  no rash, atraumatic   *Additional and/or pertinent findings included in MDM below  Diagnostic and Interventional Summary    EKG Interpretation Date/Time:  Wednesday May 23 2023 06:16:27 EDT Ventricular Rate:  73 PR Interval:  148 QRS Duration:  91 QT Interval:  376 QTC Calculation: 415 R Axis:   59  Text Interpretation: Sinus rhythm Low voltage, precordial leads Confirmed by Kennis Carina [phone]) on 05/23/2023 6:47:35 AM       Labs Reviewed  COMPREHENSIVE METABOLIC PANEL - Abnormal; Notable for the following components:      Result Value   Chloride 93 (*)    Glucose, Bld 122 (*)    All other components within normal limits  CBC - Abnormal; Notable for the following components:   WBC 20.4 (*)    RBC 5.47 (*)    Hemoglobin 16.7 (*)    HCT 50.5 (*)    Platelets 445 (*)    All other components within normal limits  LIPASE, BLOOD  URINALYSIS, ROUTINE W REFLEX MICROSCOPIC  TROPONIN I (HIGH SENSITIVITY)    DG Chest Port 1 View    (Results Pending)    Medications  famotidine (PEPCID) IVPB 20 mg premix (20 mg Intravenous New Bag/Given 05/23/23 0631)  ondansetron (ZOFRAN) injection 4 mg (4 mg Intravenous Given 05/23/23 0629)  sodium chloride 0.9 % bolus 1,000 mL (1,000 mLs  Intravenous New Bag/Given 05/23/23 0630)  HYDROmorphone (DILAUDID) injection 1 mg (1 mg Intravenous Given 05/23/23 0631)     Procedures  /  Critical Care Procedures  ED Course and Medical Decision Making  Initial Impression and Ddx Differential diagnosis includes GERD, ACS, pancreatitis, cholecystitis.  Abdomen is soft and nontender with no rebound guarding or rigidity.  Vital signs are reassuring.  Past medical/surgical history that increases complexity of ED encounter: History of brain aneurysm status post coiling  Interpretation of Diagnostics  I personally reviewed the EKG and my interpretation is as follows: Sinus rhythm without concerning ischemic features  Labs pending  Patient Reassessment and Ultimate Disposition/Management     Plan is for 2 troponins.  Potential discharge if workup reassuring and patient's symptoms are well-controlled.  Signed out to oncoming provider at shift change.  Patient management required discussion with the following services or consulting groups:  None  Complexity of Problems Addressed Acute illness or injury that poses threat of life of bodily function  Additional Data Reviewed and Analyzed Further history obtained from: Recent discharge summary, Recent Consult notes, and Prior labs/imaging results  Additional Factors Impacting ED Encounter Risk Use of parenteral controlled substances  Elmer Sow. Pilar Plate, MD [facility] Emergency Medicine [facility] [email or web address]  Final Clinical Impressions(s) / ED Diagnoses     ICD-10-CM   1. Epigastric pain  R10.13       ED Discharge Orders     None        Discharge Instructions Discussed with and Provided to Patient:   Discharge Instructions   None      Sabas Sous, MD 05/23/23 [phone]

## 2023-05-23 NOTE — Assessment & Plan Note (Signed)
Continue PPI ?

## 2023-05-23 NOTE — Hospital Course (Addendum)
Gina Wolfe is a 67 year old female with extensive history of arthritis, asthma, chronic pain, diverticulosis, GERD, IBS, cerebral aneurysm (no history of rectal bleed), chronic opioid use, headaches presenting with nausea and vomiting, and abdominal pain. Reporting of worsening vomiting episode nonbloody nonbilious, unable to tolerate p.o. Due to reports of no episodes of diarrhea  Patient reports pain is mostly in epigastric, chest abdomen area.  Patient was seen by gastroenterologist Dr. Jena Gauss on 05/01/2023 for issues with constipation secondary to chronic opioid use, GERD and dysphagia .  The patient was recommended to continue taking Linzess and  Protonix, recommended EGD and dilatation.    ED course/evaluation: Blood pressure 111/72, pulse 62, temperature 97.8 F (36.6 C), rr 18, height 5\' 2"  (1.575 m), weight 68 kg, SpO2 98%.   WBC 20.4, UA negative, hemoglobin 17.7 CT abdomen pelvis reporting- Abnormal fluid and stranding in the jejunal mesentery potentially with jejunal wall thickening. This is nonspecific but favors enteritis. No dilated bowel to indicate obstruction. No bowel wall pneumatosis or hypoenhancement. The SMA and celiac trunk appear patent. Scattered mild ascites. Sigmoid diverticulosis. Evidence of prior cholecystectomy with CBD 1.2 cm in diameter, previously 0.7 cm   Initiated on IV antibiotics of ciprofloxacin and Flagyl, IV fluids  Requested patient to be admitted for further evaluation recommendation, GI was consulted

## 2023-05-23 NOTE — ED Triage Notes (Addendum)
Pt c/o upper abdominal pain and N/V that started last night. Denies chest pain but reports pain radiates to left arm

## 2023-05-23 NOTE — Assessment & Plan Note (Signed)
-  per patient reporting recent coil placement

## 2023-05-23 NOTE — Consult Note (Signed)
Gastroenterology Consult   Referring Provider: No ref. provider found Primary Care Physician:  Elfredia Nevins, MD Primary Gastroenterologist:  Dr. Jena Gauss  Patient ID: Gina Wolfe; [id number]; June 25, 1956   Admit date: 05/23/2023  LOS: 0 days   Date of Consultation: 05/23/2023  Reason for Consultation:  epigastric pain, N/V, jejunal wall thickening on CT  History of Present Illness   Gina Wolfe is a 67 y.o. year old female with history of chronic pain on opioids, anxiety, GERD, HTN, arthritis, diverticulitis, cerebral aneurysm per patient s/p stenting, and IBS with constipation who presented to the ED with sudden onset upper abdominal pain along with nausea and vomiting.  She was also complaining of some lower chest pain as well.  Cardiac workup negative therefore CT A/P was obtained with concerns for enteritis.  GI consulted for further evaluation and assistance in management.   ED Course: Vitals - mild bradycardia, BP stable Labs - LFT wnl. Lipase normal. Hgb 16.7, plts 445, WBC 20.4. UA negative.  CT A/P - Abnormal fluid and stranding in the jejunal mesentery potentially with jejunal wall thickening. This is nonspecific but favors enteritis. No dilated bowel to indicate obstruction. No bowel wall pneumatosis or hypoenhancement. The SMA and celiac trunk appear patent. Scattered mild ascites. Sigmoid diverticulosis. Evidence of prior cholecystectomy with CBD 1.2 cm in diameter, previously 0.7 cm in this vicinity on 04/07/2023. Mild intrahepatic biliary dilatation, increased from prior. Correlate with bilirubin levels in assessing whether further workup by MRCP or ERCP is indicated.   Consult: Patient states that 2 days ago she began having sudden onset left upper quadrant and epigastric pain that slowly has been extending into her upper right side/central abdomen.  She reports out of nowhere she began feeling nauseous and started vomiting as well and has had multiple episodes.  Recently  today started burping frequently.  Tried some Zofran that she has at home with some relief but nothing significant.  She has not passed any gas and has not had a bowel movement in 2 days.  No diarrhea noted.  She reports her baseline is constipation and typically takes Linzess for this.  She denies any frequent NSAID use but does admit to chronic intermittent narcotic use given her chronic pain/degenerative disc disease.  She reports her symptoms do not change whether or not she tries to eat anything.  Denies any bloody stools.  CT in late July without enteritis or diverticulitis.  No evidence of biliary obstruction noted.  Recent seen in the office 05/01/23 by Dr. Jena Gauss where she reported issues with constipation secondary to chronic opioids. Was only taking Linzess sporadically with BM 1-2 times weekly on bloating. Reported GERD 3 times weekly despite daily protonix 40 mg and noted occasional esophageal dysphagia. He advised EGD with dilation and advised Linzess daily for 1 month and then reassess. Discussed Movantik if no improvement.   No prior EGD.  Colonoscopy December 2022: -Moderate sigmoid diverticulosis -Nonbleeding external and internal hemorrhoids -Exam otherwise normal -Advise repeat colonoscopy in 10 years.  Past Medical History:  Diagnosis Date   Arthritis    Asthma    Chronic pain syndrome    Diverticulitis    GERD (gastroesophageal reflux disease)    History of palpitations    Hypertension    IBS (irritable bowel syndrome)    PONV (postoperative nausea and vomiting)    Spinal headache     Past Surgical History:  Procedure Laterality Date   ABDOMINAL HYSTERECTOMY     BACK SURGERY  x3 previous lumbar, x3 cervical surgeries   CHOLECYSTECTOMY     COLONOSCOPY  05/20/10   VOH:[id number] hemorrhoids otherwise normal/EGD:distal esophageal erosions otherwise normal   COLONOSCOPY WITH PROPOFOL N/A 08/15/2021   Procedure: COLONOSCOPY WITH PROPOFOL;  Surgeon: Corbin Ade,  MD;  Location: [facility];  Service: Endoscopy;  Laterality: N/A;  7:30am   CYST EXCISION Right    Eye cyst   EYE SURGERY Right    removed a cyst   IR ANGIO INTRA EXTRACRAN SEL COM CAROTID INNOMINATE UNI L MOD SED  09/29/2022   IR ANGIO INTRA EXTRACRAN SEL INTERNAL CAROTID BILAT MOD SED  02/21/2022   IR ANGIO INTRA EXTRACRAN SEL INTERNAL CAROTID UNI R MOD SED  03/27/2022   IR ANGIO INTRA EXTRACRAN SEL INTERNAL CAROTID UNI R MOD SED  09/29/2022   IR ANGIO VERTEBRAL SEL VERTEBRAL UNI R MOD SED  02/21/2022   IR ANGIOGRAM FOLLOW UP STUDY  03/27/2022   IR NEURO EACH ADD'L AFTER BASIC UNI RIGHT (MS)  03/27/2022   IR TRANSCATH/EMBOLIZ  03/24/2022   IR US GUIDE VASC ACCESS RIGHT  09/29/2022   LUMBAR LAMINECTOMY/DECOMPRESSION MICRODISCECTOMY N/A 07/13/2016   Procedure: DISCECTOMY Lumbar four-five;  Surgeon: Tressie Stalker, MD;  Location: [facility] OR;  Service: Neurosurgery;  Laterality: N/A;   MENISCUS REPAIR  2007   R knee    POSTERIOR CERVICAL FUSION/FORAMINOTOMY N/A 04/10/2013   Procedure: POSTERIOR CERVICAL FUSION/FORAMINOTOMY LEVEL 2;  Surgeon: Emilee Hero, MD;  Location: [facility] OR;  Service: Orthopedics;  Laterality: N/A;  Posterior cervical decompression fusion, cervical 5-6, cervical 6-7 with instrumentation and allograft.   RADIOLOGY WITH ANESTHESIA Right 03/24/2022   Procedure: Pipeline Embolization of Right ICA Aneurysm;  Surgeon: Lisbeth Renshaw, MD;  Location: [facility] OR;  Service: Radiology;  Laterality: Right;   TOTAL KNEE ARTHROPLASTY Right 01/03/2021   Procedure: TOTAL KNEE ARTHROPLASTY;  Surgeon: Ollen Gross, MD;  Location: [facility] ORS;  Service: Orthopedics;  Laterality: Right;    TUBAL LIGATION      Prior to Admission medications   Medication Sig Start Date End Date Taking? Authorizing Provider  albuterol (PROVENTIL HFA;VENTOLIN HFA) 108 (90 Base) MCG/ACT inhaler Inhale 2 puffs into the lungs every 6 (six) hours as needed for wheezing or shortness of breath.    Yes [provider]  aspirin EC 81 MG tablet Take 81 mg by mouth daily. Swallow whole.   Yes [provider]  cholecalciferol (VITAMIN D3) 25 MCG (1000 UNIT) tablet Take 1,000 Units by mouth daily.   Yes [provider]  estrogens, conjugated, (PREMARIN) 0.625 MG tablet Take 0.625 mg by mouth daily with breakfast.    Yes [provider]  furosemide (LASIX) 20 MG tablet Take 20 mg by mouth daily as needed for edema.    Yes [provider]  linaclotide (LINZESS) 290 MCG CAPS capsule Take 290 mcg by mouth daily.   Yes [provider]  meloxicam (MOBIC) 7.5 MG tablet Take 7.5 mg by mouth 2 (two) times daily. 05/17/23  Yes [provider]  methylPREDNISolone (MEDROL DOSEPAK) 4 MG TBPK tablet TAKE 6 TABLETS ON DAY 1 AS DIRECTED ON PACKAGE AND DECREASE BY 1 TAB EACH DAY FOR A TOTAL OF 6 DAYS 05/16/23  Yes [provider]  metoprolol succinate (TOPROL XL) 25 MG 24 hr tablet Take 1 tablet (25 mg total) by mouth daily. 11/02/22  Yes Jonelle Sidle, MD  ondansetron (ZOFRAN-ODT) 8 MG disintegrating tablet Take 1 tablet (8 mg total) by mouth every  8 (eight) hours as needed for nausea or vomiting. 05/26/22  Yes Linwood Dibbles, MD  oxyCODONE-acetaminophen (PERCOCET) 10-325 MG tablet Take 1 tablet by mouth every 4 (four) hours as needed for pain. 05/02/21  Yes [provider]  pantoprazole (PROTONIX) 40 MG tablet Take 1 tablet (40 mg total) by mouth daily. 05/26/22 05/23/23 Yes Linwood Dibbles, MD  promethazine (PHENERGAN) 25 MG tablet Take 25 mg by mouth every 6 (six) hours as needed for nausea or vomiting.   Yes [provider]  DULoxetine (CYMBALTA) 30 MG capsule  04/16/23   [provider]  phentermine (ADIPEX-P) 37.5 MG tablet Take 37.5 mg by mouth daily. Patient not taking: Reported on 05/23/2023 05/16/23   [provider]    No current facility-administered medications for this encounter.   Current Outpatient Medications   Medication Sig Dispense Refill   albuterol (PROVENTIL HFA;VENTOLIN HFA) 108 (90 Base) MCG/ACT inhaler Inhale 2 puffs into the lungs every 6 (six) hours as needed for wheezing or shortness of breath.      aspirin EC 81 MG tablet Take 81 mg by mouth daily. Swallow whole.     cholecalciferol (VITAMIN D3) 25 MCG (1000 UNIT) tablet Take 1,000 Units by mouth daily.     estrogens, conjugated, (PREMARIN) 0.625 MG tablet Take 0.625 mg by mouth daily with breakfast.      furosemide (LASIX) 20 MG tablet Take 20 mg by mouth daily as needed for edema.      linaclotide (LINZESS) 290 MCG CAPS capsule Take 290 mcg by mouth daily.     meloxicam (MOBIC) 7.5 MG tablet Take 7.5 mg by mouth 2 (two) times daily.     methylPREDNISolone (MEDROL DOSEPAK) 4 MG TBPK tablet TAKE 6 TABLETS ON DAY 1 AS DIRECTED ON PACKAGE AND DECREASE BY 1 TAB EACH DAY FOR A TOTAL OF 6 DAYS     metoprolol succinate (TOPROL XL) 25 MG 24 hr tablet Take 1 tablet (25 mg total) by mouth daily. 90 tablet 3   ondansetron (ZOFRAN-ODT) 8 MG disintegrating tablet Take 1 tablet (8 mg total) by mouth every 8 (eight) hours as needed for nausea or vomiting. 12 tablet 0   oxyCODONE-acetaminophen (PERCOCET) 10-325 MG tablet Take 1 tablet by mouth every 4 (four) hours as needed for pain.     pantoprazole (PROTONIX) 40 MG tablet Take 1 tablet (40 mg total) by mouth daily. 30 tablet 0   promethazine (PHENERGAN) 25 MG tablet Take 25 mg by mouth every 6 (six) hours as needed for nausea or vomiting.     DULoxetine (CYMBALTA) 30 MG capsule  (Patient not taking: Reported on 05/23/2023)     phentermine (ADIPEX-P) 37.5 MG tablet Take 37.5 mg by mouth daily. (Patient not taking: Reported on 05/23/2023)      Allergies as of 05/23/2023 - Review Complete 05/23/2023  Allergen Reaction Noted   Clindamycin hcl Anaphylaxis    Codeine Nausea Only     Family History  Problem Relation Age of Onset   Hypertension Mother    Emphysema Mother    Cancer Father     Hypertension Other    Diabetes Other    Colon cancer Neg Hx    Colon polyps Neg Hx     Social History   Socioeconomic History   Marital status: Married    Spouse name: Not on file   Number of children: 3   Years of education: Not on file   Highest education level: Not on file  Occupational History  Not on file  Tobacco Use   Smoking status: Former    Current packs/day: 0.00    Types: Cigarettes    Quit date: 08/25/2007    Years since quitting: 15.7   Smokeless tobacco: Never  Vaping Use   Vaping status: Never Used  Substance and Sexual Activity   Alcohol use: No    Alcohol/week: 0.0 standard drinks of alcohol   Drug use: No   Sexual activity: Yes  Other Topics Concern   Not on file  Social History Narrative   Not on file   Social Determinants of Health   Financial Resource Strain: Not on file  Food Insecurity: Not on file  Transportation Needs: Not on file  Physical Activity: Not on file  Stress: Not on file  Social Connections: Not on file  Intimate Partner Violence: Not on file     Review of Systems   Gen: Denies any fever, chills, loss of appetite, change in weight or weight loss CV: Denies chest pain, heart palpitations, syncope, edema  Resp: Denies shortness of breath with rest, cough, wheezing, coughing up blood, and pleurisy. GI: see HPI GU : Denies urinary burning, blood in urine, urinary frequency, and urinary incontinence. MS: Denies joint pain, limitation of movement, swelling, cramps, and atrophy.  Derm: Denies rash, itching, dry skin, hives. Psych: Denies depression, anxiety, memory loss, hallucinations, and confusion. Heme: Denies bruising or bleeding Neuro:  Denies any headaches, dizziness, paresthesias, shaking  Physical Exam   Vital Signs in last 24 hours: Temp:  [97.6 F (36.4 C)] 97.6 F (36.4 C) (09/11 0632) Pulse Rate:  [55-78] 69 (09/11 1030) Resp:  [10-19] 19 (09/11 1030) BP: (110-140)/(62-103) 123/80 (09/11 1030) SpO2:  [93  %-99 %] 97 % (09/11 1030) Weight:  [68 kg] 68 kg (09/11 0923)   General:   Alert, pleasant and cooperative in NAD Head:  Normocephalic and atraumatic. Eyes:  Sclera clear, no icterus.   Conjunctiva pink. Ears:  Normal auditory acuity. Mouth:  No deformity or lesions, dentition normal. Neck:  Supple; no masses Lungs:  Clear throughout to auscultation.   No wheezes, crackles, or rhonchi. No acute distress. Heart:  Regular rate and rhythm; no murmurs, clicks, rubs,  or gallops. Abdomen:  Soft, mild distention.  Mild generalized TTP throughout abdomen.  No masses, hepatosplenomegaly or hernias noted. Normal bowel sounds, without guarding, and without rebound.   Rectal: deferred Msk:  Symmetrical without gross deformities. Normal posture. Extremities:  Without clubbing or edema. Neurologic:  Alert and  oriented x4. Skin:  Intact without significant lesions or rashes. Psych:  Alert and cooperative. Normal mood and affect.  Intake/Output from previous day: 09/10 0701 - 09/11 0700 In: 44.2 [IV Piggyback:44.2] Out: -  Intake/Output this shift: No intake/output data recorded.   Labs/Studies   Recent Labs Recent Labs    05/23/23 0624  WBC 20.4*  HGB 16.7*  HCT 50.5*  PLT 445*   BMET Recent Labs    05/23/23 0624  NA 135  K 3.5  CL 93*  CO2 30  GLUCOSE 122*  BUN 18  CREATININE 0.88  CALCIUM 9.1   LFT Recent Labs    05/23/23 0624  PROT 7.4  ALBUMIN 4.2  AST 24  ALT 28  ALKPHOS 47  BILITOT 0.8   PT/INR No results for input(s): "LABPROT", "INR" in the last 72 hours. Hepatitis Panel No results for input(s): "HEPBSAG", "HCVAB", "HEPAIGM", "HEPBIGM" in the last 72 hours. C-Diff No results for input(s): "CDIFFTOX" in the last 72  hours.  Radiology/Studies CT ABDOMEN PELVIS W CONTRAST  Result Date: 05/23/2023 CLINICAL DATA:  Left lower quadrant abdominal pain, nausea and vomiting starting last night. EXAM: CT ABDOMEN AND PELVIS WITH CONTRAST TECHNIQUE: Multidetector  CT imaging of the abdomen and pelvis was performed using the standard protocol following bolus administration of intravenous contrast. RADIATION DOSE REDUCTION: This exam was performed according to the departmental dose-optimization program which includes automated exposure control, adjustment of the mA and/or kV according to patient size and/or use of iterative reconstruction technique. CONTRAST:  OMNIPAQUE IOHEXOL 300 MG/ML  SOLN COMPARISON:  04/07/2023 FINDINGS: Lower chest: Mild atheromatous vascular calcification of the aortic arch. Linear subsegmental atelectasis or scarring in both lower lobes. Hepatobiliary: Prior cholecystectomy. Common bile duct 1.2 cm in diameter on image 42 series 2, previously 0.7 cm in this vicinity on 04/07/2023. Mild intrahepatic biliary dilatation, increased from prior. No focal parenchymal lesion is identified in the liver. Pancreas: Unremarkable Spleen: Unremarkable Adrenals/Urinary Tract: Unremarkable Stomach/Bowel: Sigmoid colon diverticulosis. Fatty prominence of the ileocecal valve. Normal appendix. Abnormal fluid in stranding in the jejunal mesentery potentially with jejunal wall thickening. No dilated bowel or pneumatosis. No abnormal mural hypoenhancement in the bowel wall. Scattered mild ascites. There are some air fluid levels in the distal descending colon raising suspicion for diarrheal process although there is generally formed stool in the rectal vault. Vascular/Lymphatic: Atherosclerosis is present, including aortoiliac atherosclerotic disease. Patent celiac trunk and superior mesenteric artery. No pathologic adenopathy. Reproductive: Uterus absent.  Adnexa unremarkable. Other: Scattered ascites. Musculoskeletal: Lower thoracic and lumbar spondylosis and degenerative disc disease. Degenerative endplate sclerosis at L5-S1. IMPRESSION: 1. Abnormal fluid and stranding in the jejunal mesentery potentially with jejunal wall thickening. This is nonspecific but favors  enteritis. No dilated bowel to indicate obstruction. No bowel wall pneumatosis or hypoenhancement. The SMA and celiac trunk appear patent. 2. Scattered mild ascites. 3. Sigmoid colon diverticulosis. 4. Prior cholecystectomy. Common bile duct 1.2 cm in diameter, previously 0.7 cm in this vicinity on 04/07/2023. Mild intrahepatic biliary dilatation, increased from prior. Correlate with bilirubin levels in assessing whether further workup by MRCP or ERCP is indicated. 5. Aortic atherosclerosis. Aortic Atherosclerosis (ICD10-I70.0). Electronically Signed   By: Gaylyn Rong M.D.   On: 05/23/2023 11:52   DG Chest Port 1 View  Result Date: 05/23/2023 CLINICAL DATA:  67 year old female with chest and abdominal pain. Nausea vomiting. EXAM: PORTABLE CHEST 1 VIEW COMPARISON:  Chest radiographs 05/26/2022 and earlier. FINDINGS: Portable AP upright view at 0630 hours. Lung volumes and mediastinal contours remain normal. Allowing for portable technique the lungs are clear. Visualized tracheal air column is within normal limits. No pneumothorax or pleural effusion. Chronic lower cervical anterior and posterior fusion hardware. No acute osseous abnormality identified. Paucity of bowel gas. IMPRESSION: No acute cardiopulmonary abnormality. Electronically Signed   By: Odessa Fleming M.D.   On: 05/23/2023 06:57     Assessment   Gina Wolfe is a 67 y.o. year old female with history of chronic pain on opioids, anxiety, GERD, HTN, arthritis, diverticulitis, cerebral aneurysm per patient s/p stenting, and IBS with constipation who presented to the ED with sudden onset upper abdominal pain along with nausea and vomiting and lower chest pain.  GI consulted to assist in evaluation and management of abdominal pain given CT concerns for enteritis.  Upper abdominal pain, enteritis: She has been afebrile since presentation.  Nausea, vomiting, and epigastric pain began over the last 2 days.  She denies any diarrhea.  She has  significant leukocytosis with WBC 20.4.  Platelets elevated as well, likely reactive.  Hemoglobin 16.7 likely representing dehydration.  CT A/P with contrast performed revealing abnormal fluid and stranding in the jejunal mesentery suspicious for wall thickening, favoring enteritis without obstruction.  She denies any diarrhea, actually has not had a bowel movement 2 days.  Has baseline chronic constipation secondary to opioid use.  Although she also has increased CBD dilation her symptoms do not appear to be biliary in nature.  Will treat empirically for GI virus causing enteritis with Cipro and Flagyl.  Will plan to obtain stool studies if she begins having diarrhea or any bowel movements.  Otherwise we will treat supportively with antiemetics and IV fluids.  Will continue daily PPI given baseline GERD that is somewhat controlled outpatient.  Dilated CBD: LFTs within normal limits.  History of cholecystectomy many years ago.  CT this admission reports CBD measuring 1.2 mm, previously measuring 0.7 mm in July.  This is likely incidentally finding however could consider inpatient MRCP.  If not performed inpatient could be explored further outpatient.  She denies any frequent alcohol use.  Mild CBD dilation could be secondary to chronic narcotic use and in the setting of cholecystectomy however it is worth evaluation given it has almost doubled in less than 2 months.   Plan / Recommendations   Stool studies if having any BMs Cipro/flagyl empirically for at least 5 days. MRCP could be performed inpatient to explore etiology of dilated CBD Supportive measures Monitor LFTs Continue with plan for outpatient EGD, could potentially consider inpatient if symptoms worsen Continue PPI daily     05/23/2023, 1:17 PM  Brooke Bonito, MSN, FNP-BC, AGACNP-BC [facility]

## 2023-05-23 NOTE — Assessment & Plan Note (Addendum)
-   CT abdomen pelvis reviewed, evidence of CBD 1.2 cm in diameter previously 0.7 cm compared to 04/07/2023 Mild intrahepatic biliary dilatation -LFTs including bilirubin within normal limits    Latest Ref Rng & Units 05/23/2023    6:24 AM 05/26/2022    9:51 AM 05/10/2021    9:01 AM  Hepatic Function  Total Protein 6.5 - 8.1 g/dL 7.4  8.0  6.6   Albumin 3.5 - 5.0 g/dL 4.2  4.2  3.7   AST 15 - 41 U/L 24  20  15    ALT 0 - 44 U/L 28  17  15    Alk Phosphatase 38 - 126 U/L 47  64  54   Total Bilirubin 0.3 - 1.2 mg/dL 0.8  0.6  0.4     -Consulted, further evaluation recommendation appreciated

## 2023-05-23 NOTE — ED Notes (Signed)
Pt unable to provide urine specimen at this time

## 2023-05-23 NOTE — Assessment & Plan Note (Addendum)
-   Meloxicam, currently holding -due to abdominal pain We do not want exacerbate it with NSAID induced ulcers/pain -Will utilize as needed IV hydromorphone, p.o. oxycodone

## 2023-05-23 NOTE — ED Notes (Signed)
Called to give report on pt. Rang back to secretary twice then I asked to just let me know if any questions. Going to give meds as was held up in bld transfusion.

## 2023-05-23 NOTE — H&P (Signed)
History and Physical   Patient: Gina Wolfe                            PCP: Elfredia Nevins, MD                    DOB: 07-Oct-1955            DOA: 05/23/2023 WUX:[id number]             DOS: 05/23/2023, 3:05 PM  Elfredia Nevins, MD  Patient coming from:   HOME  I have personally reviewed patient's medical records, in electronic medical records, including:  [place] link, and care everywhere.    Chief Complaint:   Chief Complaint  Patient presents with   Abdominal Pain    History of present illness:    Gina Wolfe is a 67 year old female with extensive history of arthritis, asthma, chronic pain, diverticulosis, GERD, IBS, cerebral aneurysm (no history of rectal bleed), chronic opioid use, headaches presenting with nausea and vomiting, and abdominal pain. Reporting of worsening vomiting episode nonbloody nonbilious, unable to tolerate p.o. Due to reports of no episodes of diarrhea  Patient reports pain is mostly in epigastric, chest abdomen area.  Patient was seen by gastroenterologist Dr. Jena Gauss on 05/01/2023 for issues with constipation secondary to chronic opioid use, GERD and dysphagia .  The patient was recommended to continue taking Linzess and  Protonix, recommended EGD and dilatation.    ED course/evaluation: Blood pressure 111/72, pulse 62, temperature 97.8 F (36.6 C), rr 18, height 5\' 2"  (1.575 m), weight 68 kg, SpO2 98%.   WBC 20.4, UA negative, hemoglobin 17.7 CT abdomen pelvis reporting- Abnormal fluid and stranding in the jejunal mesentery potentially with jejunal wall thickening. This is nonspecific but favors enteritis. No dilated bowel to indicate obstruction. No bowel wall pneumatosis or hypoenhancement. The SMA and celiac trunk appear patent. Scattered mild ascites. Sigmoid diverticulosis. Evidence of prior cholecystectomy with CBD 1.2 cm in diameter, previously 0.7 cm   Initiated on IV antibiotics of ciprofloxacin and Flagyl, IV fluids  Requested patient  to be admitted for further evaluation recommendation, GI was consulted    Patient Denies having: Fever, Chills, Cough, SOB, Chest Pain, Abd pain, N/V/D, headache, dizziness, lightheadedness,  Dysuria, Joint pain, rash, open wounds  ED Course:   Blood pressure 111/72, pulse 62, temperature 97.8 F (36.6 C), temperature source Oral, resp. rate 18, height 5\' 2"  (1.575 m), weight 68 kg, SpO2 98%. Abnormal labs;   Review of Systems: As per HPI, otherwise 10 point review of systems were negative.   ----------------------------------------------------------------------------------------------------------------------  Allergies  Allergen Reactions   Clindamycin Hcl Anaphylaxis   Codeine Nausea Only    Home MEDs:  Prior to Admission medications   Medication Sig Start Date End Date Taking? Authorizing Provider  albuterol (PROVENTIL HFA;VENTOLIN HFA) 108 (90 Base) MCG/ACT inhaler Inhale 2 puffs into the lungs every 6 (six) hours as needed for wheezing or shortness of breath.    Yes [provider]  aspirin EC 81 MG tablet Take 81 mg by mouth daily. Swallow whole.   Yes [provider]  cholecalciferol (VITAMIN D3) 25 MCG (1000 UNIT) tablet Take 1,000 Units by mouth daily.   Yes [provider]  estrogens, conjugated, (PREMARIN) 0.625 MG tablet Take 0.625 mg by mouth daily with breakfast.    Yes [provider]  furosemide (LASIX) 20 MG tablet Take 20 mg by mouth daily as  needed for edema.    Yes [provider]  linaclotide (LINZESS) 290 MCG CAPS capsule Take 290 mcg by mouth daily.   Yes [provider]  meloxicam (MOBIC) 7.5 MG tablet Take 7.5 mg by mouth 2 (two) times daily. 05/17/23  Yes [provider]  methylPREDNISolone (MEDROL DOSEPAK) 4 MG TBPK tablet TAKE 6 TABLETS ON DAY 1 AS DIRECTED ON PACKAGE AND DECREASE BY 1 TAB EACH DAY FOR A TOTAL OF 6 DAYS 05/16/23  Yes [provider]  metoprolol succinate (TOPROL XL) 25 MG  24 hr tablet Take 1 tablet (25 mg total) by mouth daily. 11/02/22  Yes Jonelle Sidle, MD  ondansetron (ZOFRAN-ODT) 8 MG disintegrating tablet Take 1 tablet (8 mg total) by mouth every 8 (eight) hours as needed for nausea or vomiting. 05/26/22  Yes Linwood Dibbles, MD  oxyCODONE-acetaminophen (PERCOCET) 10-325 MG tablet Take 1 tablet by mouth every 4 (four) hours as needed for pain. 05/02/21  Yes [provider]  pantoprazole (PROTONIX) 40 MG tablet Take 1 tablet (40 mg total) by mouth daily. 05/26/22 05/23/23 Yes Linwood Dibbles, MD  promethazine (PHENERGAN) 25 MG tablet Take 25 mg by mouth every 6 (six) hours as needed for nausea or vomiting.   Yes [provider]  DULoxetine (CYMBALTA) 30 MG capsule  04/16/23   [provider]  phentermine (ADIPEX-P) 37.5 MG tablet Take 37.5 mg by mouth daily. Patient not taking: Reported on 05/23/2023 05/16/23   [provider]    PRN MEDs: acetaminophen **OR** acetaminophen, bisacodyl, hydrALAZINE, HYDROmorphone (DILAUDID) injection, ipratropium, levalbuterol, ondansetron **OR** ondansetron (ZOFRAN) IV, oxyCODONE, promethazine, traZODone  Past Medical History:  Diagnosis Date   Arthritis    Asthma    Chronic pain syndrome    Diverticulitis    GERD (gastroesophageal reflux disease)    History of palpitations    Hypertension    IBS (irritable bowel syndrome)    PONV (postoperative nausea and vomiting)    Spinal headache     Past Surgical History:  Procedure Laterality Date   ABDOMINAL HYSTERECTOMY     BACK SURGERY     x3 previous lumbar, x3 cervical surgeries   CHOLECYSTECTOMY     COLONOSCOPY  05/20/10   HQI:ONGEXBMW hemorrhoids otherwise normal/EGD:distal esophageal erosions otherwise normal   COLONOSCOPY WITH PROPOFOL N/A 08/15/2021   Procedure: COLONOSCOPY WITH PROPOFOL;  Surgeon: Corbin Ade, MD;  Location: [facility];  Service: Endoscopy;  Laterality: N/A;  7:30am   CYST EXCISION Right    Eye cyst   EYE SURGERY  Right    removed a cyst   IR ANGIO INTRA EXTRACRAN SEL COM CAROTID INNOMINATE UNI L MOD SED  09/29/2022   IR ANGIO INTRA EXTRACRAN SEL INTERNAL CAROTID BILAT MOD SED  02/21/2022   IR ANGIO INTRA EXTRACRAN SEL INTERNAL CAROTID UNI R MOD SED  03/27/2022   IR ANGIO INTRA EXTRACRAN SEL INTERNAL CAROTID UNI R MOD SED  09/29/2022   IR ANGIO VERTEBRAL SEL VERTEBRAL UNI R MOD SED  02/21/2022   IR ANGIOGRAM FOLLOW UP STUDY  03/27/2022   IR NEURO EACH ADD'L AFTER BASIC UNI RIGHT (MS)  03/27/2022   IR TRANSCATH/EMBOLIZ  03/24/2022   IR US GUIDE VASC ACCESS RIGHT  09/29/2022   LUMBAR LAMINECTOMY/DECOMPRESSION MICRODISCECTOMY N/A 07/13/2016   Procedure: DISCECTOMY Lumbar four-five;  Surgeon: Tressie Stalker, MD;  Location: [facility] OR;  Service: Neurosurgery;  Laterality: N/A;   MENISCUS REPAIR  2007   R knee    POSTERIOR CERVICAL FUSION/FORAMINOTOMY N/A  04/10/2013   Procedure: POSTERIOR CERVICAL FUSION/FORAMINOTOMY LEVEL 2;  Surgeon: Emilee Hero, MD;  Location: [facility] OR;  Service: Orthopedics;  Laterality: N/A;  Posterior cervical decompression fusion, cervical 5-6, cervical 6-7 with instrumentation and allograft.   RADIOLOGY WITH ANESTHESIA Right 03/24/2022   Procedure: Pipeline Embolization of Right ICA Aneurysm;  Surgeon: Lisbeth Renshaw, MD;  Location: [facility] OR;  Service: Radiology;  Laterality: Right;   TOTAL KNEE ARTHROPLASTY Right 01/03/2021   Procedure: TOTAL KNEE ARTHROPLASTY;  Surgeon: Ollen Gross, MD;  Location: [facility] ORS;  Service: Orthopedics;  Laterality: Right;    TUBAL LIGATION       reports that she quit smoking about 15 years ago. Her smoking use included cigarettes. She has never used smokeless tobacco. She reports that she does not drink alcohol and does not use drugs.   Family History  Problem Relation Age of Onset   Hypertension Mother    Emphysema Mother    Cancer Father    Hypertension Other    Diabetes Other    Colon cancer Neg Hx    Colon polyps Neg Hx     Physical Exam:    Vitals:   05/23/23 0930 05/23/23 1000 05/23/23 1030 05/23/23 1407  BP: 110/70 112/62 123/80 111/72  Pulse: 60 (!) 58 69 62  Resp: 12 10 19 18   Temp:    97.8 F (36.6 C)  TempSrc:    Oral  SpO2: 97% 94% 97% 98%  Weight:      Height:       Constitutional: NAD, calm, comfortable Eyes: PERRL, lids and conjunctivae normal ENMT: Mucous membranes are moist. Posterior pharynx clear of any exudate or lesions.Normal dentition.  Neck: normal, supple, no masses, no thyromegaly Respiratory: clear to auscultation bilaterally, no wheezing, no crackles. Normal respiratory effort. No accessory muscle use.  Cardiovascular: Regular rate and rhythm, no murmurs / rubs / gallops. No extremity edema. 2+ pedal pulses. No carotid bruits.  Abdomen: Epigastric tenderness, no masses palpated. No hepatosplenomegaly. Bowel sounds positive.  Musculoskeletal: no clubbing / cyanosis. No joint deformity upper and lower extremities. Good ROM, no contractures. Normal muscle tone.  Neurologic: CN II-XII grossly intact. Sensation intact, DTR normal. Strength 5/5 in all 4.  Psychiatric: Normal judgment and insight. Alert and oriented x 3. Normal mood.  Skin: no rashes, lesions, ulcers. No induration Decubitus/ulcers:  Wounds: per nursing documentation         Labs on admission:    I have personally reviewed following labs and imaging studies  CBC: Recent Labs  Lab 05/23/23 0624  WBC 20.4*  HGB 16.7*  HCT 50.5*  MCV 92.3  PLT 445*   Basic Metabolic Panel: Recent Labs  Lab 05/23/23 0624 05/23/23 0806  NA 135  --   K 3.5  --   CL 93*  --   CO2 30  --   GLUCOSE 122*  --   BUN 18  --   CREATININE 0.88  --   CALCIUM 9.1  --   MG  --  1.9  PHOS  --  3.7   GFR: Estimated Creatinine Clearance: 58.4 mL/min (by C-G formula based on SCr of 0.88 mg/dL). Liver Function Tests: Recent Labs  Lab 05/23/23 0624  AST 24  ALT 28  ALKPHOS 47  BILITOT 0.8  PROT 7.4  ALBUMIN 4.2   Recent Labs  Lab  05/23/23 0624  LIPASE 27    Urine analysis:    Component Value Date/Time   COLORURINE YELLOW 05/23/2023 1219  APPEARANCEUR CLEAR 05/23/2023 1219   LABSPEC >1.046 (H) 05/23/2023 1219   PHURINE 5.0 05/23/2023 1219   GLUCOSEU NEGATIVE 05/23/2023 1219   HGBUR NEGATIVE 05/23/2023 1219   BILIRUBINUR NEGATIVE 05/23/2023 1219   KETONESUR NEGATIVE 05/23/2023 1219   PROTEINUR NEGATIVE 05/23/2023 1219   UROBILINOGEN 1.0 04/04/2013 1522   NITRITE NEGATIVE 05/23/2023 1219   LEUKOCYTESUR NEGATIVE 05/23/2023 1219    Last A1C:  No results found for: "HGBA1C"   Radiologic Exams on Admission:   CT ABDOMEN PELVIS W CONTRAST  Result Date: 05/23/2023 CLINICAL DATA:  Left lower quadrant abdominal pain, nausea and vomiting starting last night. EXAM: CT ABDOMEN AND PELVIS WITH CONTRAST TECHNIQUE: Multidetector CT imaging of the abdomen and pelvis was performed using the standard protocol following bolus administration of intravenous contrast. RADIATION DOSE REDUCTION: This exam was performed according to the departmental dose-optimization program which includes automated exposure control, adjustment of the mA and/or kV according to patient size and/or use of iterative reconstruction technique. CONTRAST:  OMNIPAQUE IOHEXOL 300 MG/ML  SOLN COMPARISON:  04/07/2023 FINDINGS: Lower chest: Mild atheromatous vascular calcification of the aortic arch. Linear subsegmental atelectasis or scarring in both lower lobes. Hepatobiliary: Prior cholecystectomy. Common bile duct 1.2 cm in diameter on image 42 series 2, previously 0.7 cm in this vicinity on 04/07/2023. Mild intrahepatic biliary dilatation, increased from prior. No focal parenchymal lesion is identified in the liver. Pancreas: Unremarkable Spleen: Unremarkable Adrenals/Urinary Tract: Unremarkable Stomach/Bowel: Sigmoid colon diverticulosis. Fatty prominence of the ileocecal valve. Normal appendix. Abnormal fluid in stranding in the jejunal mesentery  potentially with jejunal wall thickening. No dilated bowel or pneumatosis. No abnormal mural hypoenhancement in the bowel wall. Scattered mild ascites. There are some air fluid levels in the distal descending colon raising suspicion for diarrheal process although there is generally formed stool in the rectal vault. Vascular/Lymphatic: Atherosclerosis is present, including aortoiliac atherosclerotic disease. Patent celiac trunk and superior mesenteric artery. No pathologic adenopathy. Reproductive: Uterus absent.  Adnexa unremarkable. Other: Scattered ascites. Musculoskeletal: Lower thoracic and lumbar spondylosis and degenerative disc disease. Degenerative endplate sclerosis at L5-S1. IMPRESSION: 1. Abnormal fluid and stranding in the jejunal mesentery potentially with jejunal wall thickening. This is nonspecific but favors enteritis. No dilated bowel to indicate obstruction. No bowel wall pneumatosis or hypoenhancement. The SMA and celiac trunk appear patent. 2. Scattered mild ascites. 3. Sigmoid colon diverticulosis. 4. Prior cholecystectomy. Common bile duct 1.2 cm in diameter, previously 0.7 cm in this vicinity on 04/07/2023. Mild intrahepatic biliary dilatation, increased from prior. Correlate with bilirubin levels in assessing whether further workup by MRCP or ERCP is indicated. 5. Aortic atherosclerosis. Aortic Atherosclerosis (ICD10-I70.0). Electronically Signed   By: Gaylyn Rong M.D.   On: 05/23/2023 11:52   DG Chest Port 1 View  Result Date: 05/23/2023 CLINICAL DATA:  67 year old female with chest and abdominal pain. Nausea vomiting. EXAM: PORTABLE CHEST 1 VIEW COMPARISON:  Chest radiographs 05/26/2022 and earlier. FINDINGS: Portable AP upright view at 0630 hours. Lung volumes and mediastinal contours remain normal. Allowing for portable technique the lungs are clear. Visualized tracheal air column is within normal limits. No pneumothorax or pleural effusion. Chronic lower cervical anterior and  posterior fusion hardware. No acute osseous abnormality identified. Paucity of bowel gas. IMPRESSION: No acute cardiopulmonary abnormality. Electronically Signed   By: Odessa Fleming M.D.   On: 05/23/2023 06:57    EKG:   Independently reviewed.  Orders placed or performed during the hospital encounter of 05/23/23   ED EKG   ED EKG  EKG 12-Lead   EKG 12-Lead   EKG 12-Lead   ---------------------------------------------------------------------------------------------------------------------------------------    Assessment / Plan:   Principal Problem:   Enteritis Active Problems:   Leukocytosis   Dilated cbd, acquired   GERD   Primary osteoarthritis of right knee   Cerebral aneurysm, nonruptured   Chronic, continuous use of opioids   Assessment and Plan: * Enteritis - Abdominal pain associated with nausea and vomiting Patient reporting of no diarrhea  -CT abdomen and pelvis reviewed, inflammatory changes consistent with enteritis -Patient has been initiated on IV antibiotics Cipro/Flagyl will continue -Continue as needed analgesics Zofran and Phenergan -Continue IV fluid hydration -GI consulted, appreciate input  Leukocytosis - WBC 20.4, with diarrhea, nausea and vomiting -Finding reporting enteritis -Continue IV fluid, IV antibiotics Cipro/Flagyl   Dilated cbd, acquired - CT abdomen pelvis reviewed, evidence of CBD 1.2 cm in diameter previously 0.7 cm compared to 04/07/2023 Mild intrahepatic biliary dilatation -LFTs including bilirubin within normal limits    Latest Ref Rng & Units 05/23/2023    6:24 AM 05/26/2022    9:51 AM 05/10/2021    9:01 AM  Hepatic Function  Total Protein 6.5 - 8.1 g/dL 7.4  8.0  6.6   Albumin 3.5 - 5.0 g/dL 4.2  4.2  3.7   AST 15 - 41 U/L 24  20  15    ALT 0 - 44 U/L 28  17  15    Alk Phosphatase 38 - 126 U/L 47  64  54   Total Bilirubin 0.3 - 1.2 mg/dL 0.8  0.6  0.4     -Consulted, further evaluation recommendation appreciated  Chronic,  continuous use of opioids Also associated with opioid-induced constipation -Patient has been been followed by gastroenterology as an outpatient -Was on home medication of Linzess    Cerebral aneurysm, nonruptured -per patient reporting recent coil placement  Primary osteoarthritis of right knee - Meloxicam, currently holding -due to abdominal pain We do not want exacerbate it with NSAID induced ulcers/pain -Will utilize as needed IV hydromorphone, p.o. oxycodone  GERD - Continue PPI    Consults called: GI -------------------------------------------------------------------------------------------------------------------------------------------- DVT prophylaxis:  heparin injection 5,000 Units Start: 05/23/23 2200 TED hose Start: 05/23/23 1400 SCDs Start: 05/23/23 1400   Code Status:   Code Status: Full Code   Admission status: Patient will be admitted as Observation, with a greater than 2 midnight length of stay. Level of care: Med-Surg   Family Communication:  none at bedside  (The above findings and plan of care has been discussed with patient in detail, the patient expressed understanding and agreement of above plan)  --------------------------------------------------------------------------------------------------------------------------------------------------  Disposition Plan:  Anticipated 1-2 days Status is: Observation The patient remains OBS appropriate and will d/c before 2 midnights.     ----------------------------------------------------------------------------------------------------------------------------------------------------  Time spent:  54  Min.  Was spent seeing and evaluating the patient, reviewing all medical records, drawn plan of care.  SIGNED: Kendell Bane, MD, FHM. FAAFP. [facility], Pager  (Please use [email or web address] to page/ or secure chat through epic) If 7PM-7AM, please contact night-coverage [email or web address],   05/23/2023, 3:05 PM

## 2023-05-23 NOTE — Assessment & Plan Note (Addendum)
-   WBC 20.4, with diarrhea, nausea and vomiting -Finding reporting enteritis -Continue IV fluid, IV antibiotics Cipro/Flagyl

## 2023-05-23 NOTE — ED Provider Notes (Signed)
Patient signed out this morning presenting with epigastric pain and lower chest pain with reassuring abdominal exam.  She has a history of diverticulitis, asthma, GERD.  Plan from outgoing provider is to repeat troponin and reevaluate symptoms and likely discharge home if negative.  Her EKG interpreted by me shows a sinus rhythm with a rate of 73 with normal axis, normal intervals, no significant ST-T changes.  Looking back through her chart it does appear that she had recent CT of her abdomen within the past 2 months with IV contrast that shows a normal-appearing aorta with diverticulosis and no evidence of diverticulitis and no other intra-abdominal pathology.  Physical Exam  BP 136/73   Pulse 70   Temp 97.6 F (36.4 C) (Oral)   Resp 10   Wt 68 kg   SpO2 93%   BMI 27.44 kg/m   Physical Exam Gen: Appears uncomfortable Eyes: PERRL, EOMI HEENT: no oropharyngeal swelling Neck: trachea midline Resp: clear to auscultation bilaterally Card: RRR, no murmurs, rubs, or gallops Abd: The patient has mild diffuse tenderness with maximal tenderness over the left periumbilical region and left lower quadrant with guarding to deep palpation Extremities: no calf tenderness, no edema Vascular: 2+ radial pulses bilaterally, 2+ DP pulses bilaterally Skin: no rashes Psyc: acting appropriately  Procedures  Procedures  ED Course / MDM    Medical Decision Making I did reevaluate the patient after her second troponin came back.  On reexamination she is complaining of worsening abdominal pain.  It does look like she has a leukocytosis here.  I will obtain a CT scan of her abdomen to evaluate for diverticulitis, colitis, perforated viscus, appendicitis, hepatobiliary pathology, or other intra-abdominal findings.  The patient is given additional morphine and Zofran for her symptoms.  Regards to her cardiac workup this is negative so suspicion for cardiac etiology is low at this time.  I will reevaluate for  ultimate disposition.  The patient CT scan does shows some enteritis as well as a dilated common bile duct with cholecystectomy.  I did call and discussed this with Dr. Katheren Puller.  He recommends starting the patient on antibiotics and having her admitted to the hospitalist service.  They will consult on the patient during the admission.  Amount and/or Complexity of Data Reviewed Labs: ordered. Radiology: ordered.  Risk Prescription drug management. Decision regarding hospitalization.          Durwin Glaze, MD 05/23/23 [phone]

## 2023-05-23 NOTE — Assessment & Plan Note (Addendum)
-   Abdominal pain associated with nausea and vomiting Patient reporting of no diarrhea  -CT abdomen and pelvis reviewed, inflammatory changes consistent with enteritis -Patient has been initiated on IV antibiotics Cipro/Flagyl will continue -Continue as needed analgesics Zofran and Phenergan -Continue IV fluid hydration -GI consulted, appreciate input

## 2023-05-23 NOTE — Assessment & Plan Note (Addendum)
Also associated with opioid-induced constipation -Patient has been been followed by gastroenterology as an outpatient -Was on home medication of Linzess

## 2023-05-24 ENCOUNTER — Telehealth: Payer: Self-pay | Admitting: Gastroenterology

## 2023-05-24 ENCOUNTER — Encounter: Payer: Self-pay | Admitting: *Deleted

## 2023-05-24 ENCOUNTER — Other Ambulatory Visit: Payer: Self-pay | Admitting: *Deleted

## 2023-05-24 DIAGNOSIS — R1013 Epigastric pain: Secondary | ICD-10-CM | POA: Diagnosis not present

## 2023-05-24 DIAGNOSIS — K838 Other specified diseases of biliary tract: Secondary | ICD-10-CM

## 2023-05-24 DIAGNOSIS — K529 Noninfective gastroenteritis and colitis, unspecified: Secondary | ICD-10-CM | POA: Diagnosis not present

## 2023-05-24 LAB — COMPREHENSIVE METABOLIC PANEL
ALT: 17 U/L (ref 0–44)
AST: 11 U/L — ABNORMAL LOW (ref 15–41)
Albumin: 2.7 g/dL — ABNORMAL LOW (ref 3.5–5.0)
Alkaline Phosphatase: 33 U/L — ABNORMAL LOW (ref 38–126)
Anion gap: 7 (ref 5–15)
BUN: 9 mg/dL (ref 8–23)
CO2: 26 mmol/L (ref 22–32)
Calcium: 7.3 mg/dL — ABNORMAL LOW (ref 8.9–10.3)
Chloride: 102 mmol/L (ref 98–111)
Creatinine, Ser: 0.69 mg/dL (ref 0.44–1.00)
GFR, Estimated: >60 mL/min (ref >60)
Glucose, Bld: 86 mg/dL (ref 70–99)
Potassium: 3.4 mmol/L — ABNORMAL LOW (ref 3.5–5.1)
Sodium: 135 mmol/L (ref 135–145)
Total Bilirubin: 0.6 mg/dL (ref 0.3–1.2)
Total Protein: 4.9 g/dL — ABNORMAL LOW (ref 6.5–8.1)

## 2023-05-24 LAB — CBC
HCT: 37.7 % (ref 36.0–46.0)
Hemoglobin: 12.2 g/dL (ref 12.0–15.0)
MCH: 30.7 pg (ref 26.0–34.0)
MCHC: 32.4 g/dL (ref 30.0–36.0)
MCV: 94.7 fL (ref 80.0–100.0)
Platelets: 279 10*3/uL (ref 150–400)
RBC: 3.98 MIL/uL (ref 3.87–5.11)
RDW: 12.6 % (ref 11.5–15.5)
WBC: 9.2 10*3/uL (ref 4.0–10.5)
nRBC: 0 % (ref 0.0–0.2)

## 2023-05-24 LAB — HIV ANTIBODY (ROUTINE TESTING W REFLEX): HIV Screen 4th Generation wRfx: NONREACTIVE

## 2023-05-24 LAB — GLUCOSE, CAPILLARY: Glucose-Capillary: 77 mg/dL (ref 70–99)

## 2023-05-24 LAB — APTT: aPTT: 26 s (ref 24–36)

## 2023-05-24 MED ORDER — MAALOX MAX 400-400-40 MG/5ML PO SUSP: 10.0000 mL | Freq: Four times a day (QID) | ORAL | 0 refills | Status: DC | PRN

## 2023-05-24 MED ORDER — PANTOPRAZOLE SODIUM 40 MG PO TBEC: 40.0000 mg | DELAYED_RELEASE_TABLET | Freq: Every day | ORAL | 0 refills | Status: DC

## 2023-05-24 MED ORDER — POTASSIUM CHLORIDE CRYS ER 20 MEQ PO TBCR
40.0000 meq | EXTENDED_RELEASE_TABLET | Freq: Once | ORAL | Status: AC
  Administered 2023-05-24: 40 meq via ORAL
  Filled 2023-05-24: qty 2

## 2023-05-24 MED ORDER — ONDANSETRON 8 MG PO TBDP: 8.0000 mg | ORAL_TABLET | Freq: Three times a day (TID) | ORAL | 2 refills | Status: AC | PRN

## 2023-05-24 NOTE — Telephone Encounter (Signed)
MRI/MRCP has been scheduled for Sunday, 05/27/23, arrive at 2:30 pm to check in, NPO 4 hours prior. Message sent via MyChart to pt regarding appointment date, time and instructions.

## 2023-05-24 NOTE — Progress Notes (Signed)
Gastroenterology Progress Note   Referring Provider: No ref. provider found Primary Care Physician:  Elfredia Nevins, MD Primary Gastroenterologist:  Roetta Sessions, MD Patient ID: Gina Wolfe; [id number]; 11/10/1955   Subjective:    Feeling better. Has been able to tolerate some breakfast this morning. Some mild nausea. Abdominal pain persists but improved. Has a normal stool this morning. No diarrhea with this illness.   Objective:   Vital signs in last 24 hours: Temp:  [97.8 F (36.6 C)-98.2 F (36.8 C)] 98.2 F (36.8 C) (09/12 0430) Pulse Rate:  [51-69] 66 (09/12 0430) Resp:  [10-20] 18 (09/12 0430) BP: (97-123)/(52-103) 98/52 (09/12 0430) SpO2:  [94 %-100 %] 98 % (09/12 0430) Weight:  [68 kg-71.6 kg] 71.6 kg (09/12 0430) Last BM Date : 05/21/23 General:   Alert,  Well-developed, well-nourished, pleasant and cooperative in NAD Head:  Normocephalic and atraumatic. Eyes:  Sclera clear, no icterus.  Abdomen:  Soft, nontender and nondistended. No masses, hepatosplenomegaly or hernias noted. Normal bowel sounds, without guarding, and without rebound.   Extremities:  Without clubbing, deformity or edema. Neurologic:  Alert and  oriented x4;  grossly normal neurologically. Skin:  Intact without significant lesions or rashes. Psych:  Alert and cooperative. Normal mood and affect.  Intake/Output from previous day: 09/11 0701 - 09/12 0700 In: 1887.9 [P.O.:240; I.V.:1336.3; IV Piggyback:311.6] Out: -  Intake/Output this shift: No intake/output data recorded.  Lab Results: CBC Recent Labs    05/23/23 0624 05/24/23 0638  WBC 20.4* 9.2  HGB 16.7* 12.2  HCT 50.5* 37.7  MCV 92.3 94.7  PLT 445* 279   BMET Recent Labs    05/23/23 0624 05/24/23 0431  NA 135 135  K 3.5 3.4*  CL 93* 102  CO2 30 26  GLUCOSE 122* 86  BUN 18 9  CREATININE 0.88 0.69  CALCIUM 9.1 7.3*   LFTs Recent Labs    05/23/23 0624 05/24/23 0431  BILITOT 0.8 0.6  ALKPHOS 47 33*  AST 24 11*   ALT 28 17  PROT 7.4 4.9*  ALBUMIN 4.2 2.7*   Recent Labs    05/23/23 0624  LIPASE 27   PT/INR No results for input(s): "LABPROT", "INR" in the last 72 hours.       Imaging Studies: CT ABDOMEN PELVIS W CONTRAST  Result Date: 05/23/2023 CLINICAL DATA:  Left lower quadrant abdominal pain, nausea and vomiting starting last night. EXAM: CT ABDOMEN AND PELVIS WITH CONTRAST TECHNIQUE: Multidetector CT imaging of the abdomen and pelvis was performed using the standard protocol following bolus administration of intravenous contrast. RADIATION DOSE REDUCTION: This exam was performed according to the departmental dose-optimization program which includes automated exposure control, adjustment of the mA and/or kV according to patient size and/or use of iterative reconstruction technique. CONTRAST:  OMNIPAQUE IOHEXOL 300 MG/ML  SOLN COMPARISON:  04/07/2023 FINDINGS: Lower chest: Mild atheromatous vascular calcification of the aortic arch. Linear subsegmental atelectasis or scarring in both lower lobes. Hepatobiliary: Prior cholecystectomy. Common bile duct 1.2 cm in diameter on image 42 series 2, previously 0.7 cm in this vicinity on 04/07/2023. Mild intrahepatic biliary dilatation, increased from prior. No focal parenchymal lesion is identified in the liver. Pancreas: Unremarkable Spleen: Unremarkable Adrenals/Urinary Tract: Unremarkable Stomach/Bowel: Sigmoid colon diverticulosis. Fatty prominence of the ileocecal valve. Normal appendix. Abnormal fluid in stranding in the jejunal mesentery potentially with jejunal wall thickening. No dilated bowel or pneumatosis. No abnormal mural hypoenhancement in the bowel wall. Scattered mild ascites. There are some air fluid levels  in the distal descending colon raising suspicion for diarrheal process although there is generally formed stool in the rectal vault. Vascular/Lymphatic: Atherosclerosis is present, including aortoiliac atherosclerotic disease. Patent  celiac trunk and superior mesenteric artery. No pathologic adenopathy. Reproductive: Uterus absent.  Adnexa unremarkable. Other: Scattered ascites. Musculoskeletal: Lower thoracic and lumbar spondylosis and degenerative disc disease. Degenerative endplate sclerosis at L5-S1. IMPRESSION: 1. Abnormal fluid and stranding in the jejunal mesentery potentially with jejunal wall thickening. This is nonspecific but favors enteritis. No dilated bowel to indicate obstruction. No bowel wall pneumatosis or hypoenhancement. The SMA and celiac trunk appear patent. 2. Scattered mild ascites. 3. Sigmoid colon diverticulosis. 4. Prior cholecystectomy. Common bile duct 1.2 cm in diameter, previously 0.7 cm in this vicinity on 04/07/2023. Mild intrahepatic biliary dilatation, increased from prior. Correlate with bilirubin levels in assessing whether further workup by MRCP or ERCP is indicated. 5. Aortic atherosclerosis. Aortic Atherosclerosis (ICD10-I70.0). Electronically Signed   By: Gaylyn Rong M.D.   On: 05/23/2023 11:52   DG Chest Port 1 View  Result Date: 05/23/2023 CLINICAL DATA:  67 year old female with chest and abdominal pain. Nausea vomiting. EXAM: PORTABLE CHEST 1 VIEW COMPARISON:  Chest radiographs 05/26/2022 and earlier. FINDINGS: Portable AP upright view at 0630 hours. Lung volumes and mediastinal contours remain normal. Allowing for portable technique the lungs are clear. Visualized tracheal air column is within normal limits. No pneumothorax or pleural effusion. Chronic lower cervical anterior and posterior fusion hardware. No acute osseous abnormality identified. Paucity of bowel gas. IMPRESSION: No acute cardiopulmonary abnormality. Electronically Signed   By: Odessa Fleming M.D.   On: 05/23/2023 06:57   CT ANGIO HEAD NECK W WO CM  Result Date: 05/19/2023 CLINICAL DATA:  Episode of temporary blindness lasting 20 seconds 5 days ago. Personal history of brain aneurysm. EXAM: CT ANGIOGRAPHY HEAD AND NECK WITH AND  WITHOUT CONTRAST TECHNIQUE: Multidetector CT imaging of the head and neck was performed using the standard protocol during bolus administration of intravenous contrast. Multiplanar CT image reconstructions and MIPs were obtained to evaluate the vascular anatomy. Carotid stenosis measurements (when applicable) are obtained utilizing NASCET criteria, using the distal internal carotid diameter as the denominator. RADIATION DOSE REDUCTION: This exam was performed according to the departmental dose-optimization program which includes automated exposure control, adjustment of the mA and/or kV according to patient size and/or use of iterative reconstruction technique. CONTRAST:  75mL OMNIPAQUE IOHEXOL 350 MG/ML SOLN COMPARISON:  CT angio 12/07/2021. FINDINGS: CT HEAD FINDINGS Brain: No acute infarct, hemorrhage, or mass lesion is present. No significant white matter lesions are present. Deep brain nuclei are within normal limits. The brainstem and cerebellum are within normal limits. Midline structures are within normal limits. Vascular: A right cavernous internal carotid artery stent is present. No hyperdense vessel is present. Skull: Calvarium is intact. No focal lytic or blastic lesions are present. No significant extracranial soft tissue lesion is present. Sinuses/Orbits: The paranasal sinuses and mastoid air cells are clear. The globes and orbits are within normal limits. Review of the MIP images confirms the above findings CTA NECK FINDINGS Aortic arch: A 4 vessel arch is present. Atherosclerotic calcifications are present in the distal aorta. 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 Right carotid system: The right common carotid artery is within normal limits. Bifurcation is unremarkable. The cervical right ICA is normal. Left carotid system: The left common carotid artery is within normal limits. Bifurcation is unremarkable. The cervical left ICA is normal. Vertebral arteries: The  right vertebral artery originates from the subclavian artery without significant stenosis. No significant stenosis present in the right vertebral artery in the neck. The left vertebral artery is reconstituted in the V2 segment at the C4 level. Skeleton: Cervical fusion C4-7 is stable. Hardware is intact. No focal osseous lesions are present. Other neck: Soft tissues the neck are otherwise unremarkable. Salivary glands are within normal limits. Thyroid is normal. No significant adenopathy is present. No focal mucosal or submucosal lesions are present. Upper chest: The lung apices are clear. The thoracic inlet is within normal limits. Review of the MIP images confirms the above findings CTA HEAD FINDINGS Anterior circulation: The pipeline stent in the cavernous right internal carotid artery is patent. No residual aneurysm is present. The left cavernous internal carotid artery is normal. The ICA termini normal bilaterally. The A1 and M1 segments are normal. The MCA bifurcations are intact. The ACA and MCA branch vessels are normal bilaterally. Posterior circulation: The right vertebral artery is dominant vessel. Right PICA origin is visualized and normal. The left vertebral artery is hypoplastic. The vertebrobasilar junction basilar artery normal. The superior cerebellar arteries patent. The vertebral arteries received contributions from posterior communicating arteries and P1 segments bilaterally. PCA branch vessels are normal bilaterally. Venous sinuses: The dural sinuses are patent. The straight sinus and deep cerebral veins are intact. Cortical veins are within normal limits. No significant vascular malformation is evident. Anatomic variants: Fetal type posterior cerebral arteries bilaterally. Review of the MIP images confirms the above findings IMPRESSION: 1. No acute or focal lesion to explain the patient's symptoms. 2. 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 3. Patent pipeline stent in the cavernous right internal carotid artery without residual aneurysm. 4. Normal variant CTA circle-of-Willis without significant proximal stenosis, aneurysm, or branch vessel occlusion. 5.  Aortic Atherosclerosis (ICD10-I70.0). Electronically Signed   By: Marin Roberts M.D.   On: 05/19/2023 18:14  [2 weeks]  Assessment:   Gina Wolfe is a 67 y.o. year old female with history of chronic pain on opioids, anxiety, GERD, HTN, arthritis, diverticulitis, cerebral aneurysm per patient s/p stenting, and IBS with constipation who presented to the ED with sudden onset upper abdominal pain along with nausea and vomiting and lower chest pain.  GI consulted to assist in evaluation and management of abdominal pain given CT concerns for enteritis.   Upper abdominal pain, enteritis: She has been afebrile since presentation.  Nausea, vomiting, and epigastric pain began over the last 2 days.  She denies any diarrhea.  She had significant leukocytosis with WBC 20.4 on admission, down to 9200 today.  Platelets elevated as well, likely reactive.  Hemoglobin 16.7 down to 12.2 likely representing previous dehydration with hemodilutional effect.  CT A/P with contrast performed revealing abnormal fluid and stranding in the jejunal mesentery suspicious for wall thickening, favoring enteritis without obstruction. Empirically being treated for suspected acute infectious enteritis with Cipro and Flagyl.  Stool studies ordered but no diarrhea this admission. Much improved this morning.   Dilated CBD: LFTs within normal limits.  History of cholecystectomy many years ago.  CT this admission reports CBD measuring 1.2 mm, previously measuring 0.7 mm in July. Mild CBD dilation could be secondary to chronic narcotic use and in the setting of cholecystectomy however it is worth evaluation given it has almost doubled in less than 2 months.  Current symptoms do not appear  biliary in nature.  Consider outpatient MRI abdomen/MRCP.  Dysphagia: Outpatient EGD with esophageal dilation planned.    Plan:   Complete 5 day course of Cipro/Flagyl.  Plan for MRI Abd/MRCP as outpatient. We will make arrangements.  Plan for outpatient EGD with esophageal dilation. We will make arrangements. Continue daily PPI. OK for discharge from a GI standpoint.    LOS: 0 days   Leanna Battles. Dixon Boos [facility] [phone] 9/12/20247:48 AM

## 2023-05-24 NOTE — Progress Notes (Signed)
   05/24/23 1258  TOC Brief Assessment  Insurance and Status Reviewed  Patient has primary care physician Yes  Home environment has been reviewed Home with spouse  Prior level of function: needs assistance  Prior/Current Home Services No current home services  Social Determinants of Health Reivew SDOH reviewed no interventions necessary  Readmission risk has been reviewed Yes  Transition of care needs no transition of care needs at this time   Work up complete, patient was in OBS, discharging home, no needs.

## 2023-05-24 NOTE — Telephone Encounter (Signed)
Patient likely to go home today.  She is still waiting for EGD to be schedule per recent outpatient orders by Dr. Jena Gauss. She was advised that staff would reach out when schedule available.  She also needs MRI Abd/MRCP with and without contrast for DX: dilated common bile duct, dilated biliary tree. Please schedule. Patient aware and agreeable.

## 2023-05-24 NOTE — Care Management Obs Status (Signed)
MEDICARE OBSERVATION STATUS NOTIFICATION   Patient Details  Name: Gina Wolfe MRN: [id number] Date of Birth: 1955/09/12   Medicare Observation Status Notification Given:  Yes    Corey Harold 05/24/2023, 1:18 PM

## 2023-05-24 NOTE — Progress Notes (Signed)
IV removed, site WNL. Patient given discharge instructions and verbalized understanding.

## 2023-05-24 NOTE — Plan of Care (Signed)
  Problem: Education: Goal: Understanding of CV disease, CV risk reduction, and recovery process will improve Outcome: Progressing Goal: Individualized Educational Video(s) Outcome: Progressing   

## 2023-05-24 NOTE — Discharge Summary (Signed)
Physician Discharge Summary   Patient: Gina Wolfe MRN: [id number] DOB: Feb 21, 1956  Admit date:     05/23/2023  Discharge date: 05/24/23  Discharge Physician: Kendell Bane   PCP: Elfredia Nevins, MD   Recommendations at discharge:   Continue advancing diet slowly, more preferably to soft diet Follow-up with gastroenterologist in 1 week: MRI/MRCP has been scheduled for Sunday, 05/27/23, arrive at 2:30 pm to check in, NPO 4 hours prior Follow-up with PCP in 1-2 weeks Monitor blood pressure daily, if systolic less than 100 would hold metoprolol and lasix    Discharge Diagnoses: Principal Problem:   Enteritis Active Problems:   Leukocytosis   Dilated cbd, acquired   GERD   Primary osteoarthritis of right knee   Cerebral aneurysm, nonruptured   Chronic, continuous use of opioids  Resolved Problems:   * No resolved hospital problems. *  Hospital Course: Gina Wolfe is a 67 year old female with extensive history of arthritis, asthma, chronic pain, diverticulosis, GERD, IBS, cerebral aneurysm (no history of rectal bleed), chronic opioid use, headaches presenting with nausea and vomiting, and abdominal pain. Reporting of worsening vomiting episode nonbloody nonbilious, unable to tolerate p.o. Due to reports of no episodes of diarrhea  Patient reports pain is mostly in epigastric, chest abdomen area.  Patient was seen by gastroenterologist Dr. Jena Gauss on 05/01/2023 for issues with constipation secondary to chronic opioid use, GERD and dysphagia .  The patient was recommended to continue taking Linzess and  Protonix, recommended EGD and dilatation.    ED course/evaluation: Blood pressure 111/72, pulse 62, temperature 97.8 F (36.6 C), rr 18, height 5\' 2"  (1.575 m), weight 68 kg, SpO2 98%.   WBC 20.4, UA negative, hemoglobin 17.7 CT abdomen pelvis reporting- Abnormal fluid and stranding in the jejunal mesentery potentially with jejunal wall thickening. This is nonspecific but  favors enteritis. No dilated bowel to indicate obstruction. No bowel wall pneumatosis or hypoenhancement. The SMA and celiac trunk appear patent. Scattered mild ascites. Sigmoid diverticulosis. Evidence of prior cholecystectomy with CBD 1.2 cm in diameter, previously 0.7 cm   Initiated on IV antibiotics of ciprofloxacin and Flagyl, IV fluids  Requested patient to be admitted for further evaluation recommendation, GI was consulted  Assessment and Plan: * Enteritis - Abdominal pain associated with nausea and vomiting Improved   -CT abdomen and pelvis reviewed, inflammatory changes consistent with enteritis -Patient has been initiated on IV antibiotics Cipro/Flagyl will continue -Continue as needed analgesics Zofran and Phenergan -Status post IV fluid hydration, tolerating and advancing diet  Status post evaluation by gastroenterologist, further workup as an outpatient recommended: Including EGD, MRCP  MRI/MRCP has been scheduled for Sunday, 05/27/23, arrive at 2:30 pm    Leukocytosis - WBC 20.4 >>> 9.2  -with, nausea and vomiting -slowly reactive, no other signs of infection -Finding reporting enteritis -Continue IV fluid, IV antibiotics Cipro/Flagyl   Dilated cbd, acquired - CT abdomen pelvis reviewed, evidence of CBD 1.2 cm in diameter previously 0.7 cm compared to 04/07/2023 Mild intrahepatic biliary dilatation -LFTs including bilirubin within normal limits    Latest Ref Rng & Units 05/23/2023    6:24 AM 05/26/2022    9:51 AM 05/10/2021    9:01 AM  Hepatic Function  Total Protein 6.5 - 8.1 g/dL 7.4  8.0  6.6   Albumin 3.5 - 5.0 g/dL 4.2  4.2  3.7   AST 15 - 41 U/L 24  20  15    ALT 0 - 44 U/L 28  17  15  Alk Phosphatase 38 - 126 U/L 47  64  54   Total Bilirubin 0.3 - 1.2 mg/dL 0.8  0.6  0.4     -Gastroenterology was consulted, recommending outpatient follow-up and workup  Chronic, continuous use of opioids Also associated with opioid-induced constipation -Patient has  been been followed by gastroenterology as an outpatient -Was on home medication of Linzess    Cerebral aneurysm, nonruptured -per patient reporting recent coil placement  Primary osteoarthritis of right knee - Meloxicam, currently holding -due to abdominal pain We do not want exacerbate it with NSAID induced ulcers/pain -Will utilize as needed IV hydromorphone, p.o. oxycodone  GERD - Continue PPI      Disposition: Home Diet recommendation:  Discharge Diet Orders (From admission, onward)     Start     Ordered   05/24/23 0000  Diet - low sodium heart healthy        05/24/23 1207           Full liquid diet DISCHARGE MEDICATION: Allergies as of 05/24/2023       Reactions   Clindamycin Hcl Anaphylaxis   Codeine Nausea Only        Medication List     STOP taking these medications    DULoxetine 30 MG capsule Commonly known as: CYMBALTA   meloxicam 7.5 MG tablet Commonly known as: MOBIC   phentermine 37.5 MG tablet Commonly known as: ADIPEX-P       TAKE these medications    albuterol 108 (90 Base) MCG/ACT inhaler Commonly known as: VENTOLIN HFA Inhale 2 puffs into the lungs every 6 (six) hours as needed for wheezing or shortness of breath.   aspirin EC 81 MG tablet Take 81 mg by mouth daily. Swallow whole.   cholecalciferol 25 MCG (1000 UNIT) tablet Commonly known as: VITAMIN D3 Take 1,000 Units by mouth daily.   estrogens (conjugated) 0.625 MG tablet Commonly known as: PREMARIN Take 0.625 mg by mouth daily with breakfast.   furosemide 20 MG tablet Commonly known as: LASIX Take 20 mg by mouth daily as needed for edema.   linaclotide 290 MCG Caps capsule Commonly known as: LINZESS Take 290 mcg by mouth daily.   Maalox Max 400-400-40 MG/5ML suspension Generic drug: alum & mag hydroxide-simeth Take 10 mLs by mouth every 6 (six) hours as needed for indigestion.   methylPREDNISolone 4 MG Tbpk tablet Commonly known as: MEDROL DOSEPAK TAKE 6  TABLETS ON DAY 1 AS DIRECTED ON PACKAGE AND DECREASE BY 1 TAB EACH DAY FOR A TOTAL OF 6 DAYS   metoprolol succinate 25 MG 24 hr tablet Commonly known as: Toprol XL Take 1 tablet (25 mg total) by mouth daily.   ondansetron 8 MG disintegrating tablet Commonly known as: ZOFRAN-ODT Take 1 tablet (8 mg total) by mouth every 8 (eight) hours as needed for nausea or vomiting.   oxyCODONE-acetaminophen 10-325 MG tablet Commonly known as: PERCOCET Take 1 tablet by mouth every 4 (four) hours as needed for pain.   pantoprazole 40 MG tablet Commonly known as: Protonix Take 1 tablet (40 mg total) by mouth daily.   promethazine 25 MG tablet Commonly known as: PHENERGAN Take 25 mg by mouth every 6 (six) hours as needed for nausea or vomiting.        Discharge Exam: Filed Weights   05/23/23 4098 05/23/23 1807 05/24/23 0430  Weight: 68 kg 71.6 kg 71.6 kg        General:  AAO x 3,  cooperative, no distress;  HEENT:  Normocephalic, PERRL, otherwise with in Normal limits   Neuro:  CNII-XII intact. , normal motor and sensation, reflexes intact   Lungs:   Clear to auscultation BL, Respirations unlabored,  No wheezes / crackles  Cardio:    S1/S2, RRR, No murmure, No Rubs or Gallops   Abdomen:  Soft, non-tender, bowel sounds active all four quadrants, no guarding or peritoneal signs.  Muscular  skeletal:  Limited exam -global generalized weaknesses - in bed, able to move all 4 extremities,   2+ pulses,  symmetric, No pitting edema  Skin:  Dry, warm to touch, negative for any Rashes,  Wounds: Please see nursing documentation          Condition at discharge: good  The results of significant diagnostics from this hospitalization (including imaging, microbiology, ancillary and laboratory) are listed below for reference.   Imaging Studies: CT ABDOMEN PELVIS W CONTRAST  Result Date: 05/23/2023 CLINICAL DATA:  Left lower quadrant abdominal pain, nausea and vomiting starting last night.  EXAM: CT ABDOMEN AND PELVIS WITH CONTRAST TECHNIQUE: Multidetector CT imaging of the abdomen and pelvis was performed using the standard protocol following bolus administration of intravenous contrast. RADIATION DOSE REDUCTION: This exam was performed according to the departmental dose-optimization program which includes automated exposure control, adjustment of the mA and/or kV according to patient size and/or use of iterative reconstruction technique. CONTRAST:  OMNIPAQUE IOHEXOL 300 MG/ML  SOLN COMPARISON:  04/07/2023 FINDINGS: Lower chest: Mild atheromatous vascular calcification of the aortic arch. Linear subsegmental atelectasis or scarring in both lower lobes. Hepatobiliary: Prior cholecystectomy. Common bile duct 1.2 cm in diameter on image 42 series 2, previously 0.7 cm in this vicinity on 04/07/2023. Mild intrahepatic biliary dilatation, increased from prior. No focal parenchymal lesion is identified in the liver. Pancreas: Unremarkable Spleen: Unremarkable Adrenals/Urinary Tract: Unremarkable Stomach/Bowel: Sigmoid colon diverticulosis. Fatty prominence of the ileocecal valve. Normal appendix. Abnormal fluid in stranding in the jejunal mesentery potentially with jejunal wall thickening. No dilated bowel or pneumatosis. No abnormal mural hypoenhancement in the bowel wall. Scattered mild ascites. There are some air fluid levels in the distal descending colon raising suspicion for diarrheal process although there is generally formed stool in the rectal vault. Vascular/Lymphatic: Atherosclerosis is present, including aortoiliac atherosclerotic disease. Patent celiac trunk and superior mesenteric artery. No pathologic adenopathy. Reproductive: Uterus absent.  Adnexa unremarkable. Other: Scattered ascites. Musculoskeletal: Lower thoracic and lumbar spondylosis and degenerative disc disease. Degenerative endplate sclerosis at L5-S1. IMPRESSION: 1. Abnormal fluid and stranding in the jejunal mesentery  potentially with jejunal wall thickening. This is nonspecific but favors enteritis. No dilated bowel to indicate obstruction. No bowel wall pneumatosis or hypoenhancement. The SMA and celiac trunk appear patent. 2. Scattered mild ascites. 3. Sigmoid colon diverticulosis. 4. Prior cholecystectomy. Common bile duct 1.2 cm in diameter, previously 0.7 cm in this vicinity on 04/07/2023. Mild intrahepatic biliary dilatation, increased from prior. Correlate with bilirubin levels in assessing whether further workup by MRCP or ERCP is indicated. 5. Aortic atherosclerosis. Aortic Atherosclerosis (ICD10-I70.0). Electronically Signed   By: Gaylyn Rong M.D.   On: 05/23/2023 11:52   DG Chest Port 1 View  Result Date: 05/23/2023 CLINICAL DATA:  67 year old female with chest and abdominal pain. Nausea vomiting. EXAM: PORTABLE CHEST 1 VIEW COMPARISON:  Chest radiographs 05/26/2022 and earlier. FINDINGS: Portable AP upright view at 0630 hours. Lung volumes and mediastinal contours remain normal. Allowing for portable technique the lungs are clear. Visualized tracheal air column is within normal limits. No pneumothorax  or pleural effusion. Chronic lower cervical anterior and posterior fusion hardware. No acute osseous abnormality identified. Paucity of bowel gas. IMPRESSION: No acute cardiopulmonary abnormality. Electronically Signed   By: Odessa Fleming M.D.   On: 05/23/2023 06:57   CT ANGIO HEAD NECK W WO CM  Result Date: 05/19/2023 CLINICAL DATA:  Episode of temporary blindness lasting 20 seconds 5 days ago. Personal history of brain aneurysm. EXAM: CT ANGIOGRAPHY HEAD AND NECK WITH AND WITHOUT CONTRAST TECHNIQUE: Multidetector CT imaging of the head and neck was performed using the standard protocol during bolus administration of intravenous contrast. Multiplanar CT image reconstructions and MIPs were obtained to evaluate the vascular anatomy. Carotid stenosis measurements (when applicable) are obtained utilizing NASCET  criteria, using the distal internal carotid diameter as the denominator. RADIATION DOSE REDUCTION: This exam was performed according to the departmental dose-optimization program which includes automated exposure control, adjustment of the mA and/or kV according to patient size and/or use of iterative reconstruction technique. CONTRAST:  75mL OMNIPAQUE IOHEXOL 350 MG/ML SOLN COMPARISON:  CT angio 12/07/2021. FINDINGS: CT HEAD FINDINGS Brain: No acute infarct, hemorrhage, or mass lesion is present. No significant white matter lesions are present. Deep brain nuclei are within normal limits. The brainstem and cerebellum are within normal limits. Midline structures are within normal limits. Vascular: A right cavernous internal carotid artery stent is present. No hyperdense vessel is present. Skull: Calvarium is intact. No focal lytic or blastic lesions are present. No significant extracranial soft tissue lesion is present. Sinuses/Orbits: The paranasal sinuses and mastoid air cells are clear. The globes and orbits are within normal limits. Review of the MIP images confirms the above findings CTA NECK FINDINGS Aortic arch: A 4 vessel arch is present. Atherosclerotic calcifications are present in the distal aorta. 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 Right carotid system: The right common carotid artery is within normal limits. Bifurcation is unremarkable. The cervical right ICA is normal. Left carotid system: The left common carotid artery is within normal limits. Bifurcation is unremarkable. The cervical left ICA is normal. Vertebral arteries: The right vertebral artery originates from the subclavian artery without significant stenosis. No significant stenosis present in the right vertebral artery in the neck. The left vertebral artery is reconstituted in the V2 segment at the C4 level. Skeleton: Cervical fusion C4-7 is stable. Hardware is intact. No focal osseous lesions are present.  Other neck: Soft tissues the neck are otherwise unremarkable. Salivary glands are within normal limits. Thyroid is normal. No significant adenopathy is present. No focal mucosal or submucosal lesions are present. Upper chest: The lung apices are clear. The thoracic inlet is within normal limits. Review of the MIP images confirms the above findings CTA HEAD FINDINGS Anterior circulation: The pipeline stent in the cavernous right internal carotid artery is patent. No residual aneurysm is present. The left cavernous internal carotid artery is normal. The ICA termini normal bilaterally. The A1 and M1 segments are normal. The MCA bifurcations are intact. The ACA and MCA branch vessels are normal bilaterally. Posterior circulation: The right vertebral artery is dominant vessel. Right PICA origin is visualized and normal. The left vertebral artery is hypoplastic. The vertebrobasilar junction basilar artery normal. The superior cerebellar arteries patent. The vertebral arteries received contributions from posterior communicating arteries and P1 segments bilaterally. PCA branch vessels are normal bilaterally. Venous sinuses: The dural sinuses are patent. The straight sinus and deep cerebral veins are intact. Cortical veins are within normal limits. No significant  vascular malformation is evident. Anatomic variants: Fetal type posterior cerebral arteries bilaterally. Review of the MIP images confirms the above findings IMPRESSION: 1. No acute or focal lesion to explain the patient's symptoms. 2. 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 3. Patent pipeline stent in the cavernous right internal carotid artery without residual aneurysm. 4. Normal variant CTA circle-of-Willis without significant proximal stenosis, aneurysm, or branch vessel occlusion. 5.  Aortic Atherosclerosis (ICD10-I70.0). Electronically Signed   By: Marin Roberts M.D.    On: 05/19/2023 18:14    Microbiology: Results for orders placed or performed during the hospital encounter of 05/26/22  Resp Panel by RT-PCR (Flu A&B, Covid) Anterior Nasal Swab     Status: Abnormal   Collection Time: 05/26/22 10:36 AM   Specimen: Anterior Nasal Swab  Result Value Ref Range Status   SARS Coronavirus 2 by RT PCR POSITIVE (A) NEGATIVE Final    Comment: (NOTE) SARS-CoV-2 target nucleic acids are DETECTED.  The SARS-CoV-2 RNA is generally detectable in upper respiratory specimens during the acute phase of infection. Positive results are indicative of the presence of the identified virus, but do not rule out bacterial infection or co-infection with other pathogens not detected by the test. Clinical correlation with patient history and other diagnostic information is necessary to determine patient infection status. The expected result is Negative.  Fact Sheet for Patients: BloggerCourse.com  Fact Sheet for Healthcare Providers: SeriousBroker.it  This test is not yet approved or cleared by the [place] FDA and  has been authorized for detection and/or diagnosis of SARS-CoV-2 by FDA under an Emergency Use Authorization (EUA).  This EUA will remain in effect (meaning this test can be used) for the duration of  the COVID-19 declaration under Section 564(b)(1) of the A ct, 21 U.S.C. section 360bbb-3(b)(1), unless the authorization is terminated or revoked sooner.     Influenza A by PCR NEGATIVE NEGATIVE Final   Influenza B by PCR NEGATIVE NEGATIVE Final    Comment: (NOTE) The Xpert Xpress SARS-CoV-2/FLU/RSV plus assay is intended as an aid in the diagnosis of influenza from Nasopharyngeal swab specimens and should not be used as a sole basis for treatment. Nasal washings and aspirates are unacceptable for Xpert Xpress SARS-CoV-2/FLU/RSV testing.  Fact Sheet for  Patients: BloggerCourse.com  Fact Sheet for Healthcare Providers: SeriousBroker.it  This test is not yet approved or cleared by the [place] FDA and has been authorized for detection and/or diagnosis of SARS-CoV-2 by FDA under an Emergency Use Authorization (EUA). This EUA will remain in effect (meaning this test can be used) for the duration of the COVID-19 declaration under Section 564(b)(1) of the Act, 21 U.S.C. section 360bbb-3(b)(1), unless the authorization is terminated or revoked.  Performed at [facility], [address], [place], [place] [address]     Labs: CBC: Recent Labs  Lab 05/23/23 0624 05/24/23 0638  WBC 20.4* 9.2  HGB 16.7* 12.2  HCT 50.5* 37.7  MCV 92.3 94.7  PLT 445* 279   Basic Metabolic Panel: Recent Labs  Lab 05/23/23 0624 05/23/23 0806 05/24/23 0431  NA 135  --  135  K 3.5  --  3.4*  CL 93*  --  102  CO2 30  --  26  GLUCOSE 122*  --  86  BUN 18  --  9  CREATININE 0.88  --  0.69  CALCIUM 9.1  --  7.3*  MG  --  1.9  --   PHOS  --  3.7  --    Liver Function Tests: Recent Labs  Lab 05/23/23 0624 05/24/23 0431  AST 24 11*  ALT 28 17  ALKPHOS 47 33*  BILITOT 0.8 0.6  PROT 7.4 4.9*  ALBUMIN 4.2 2.7*   CBG: Recent Labs  Lab 05/24/23 0730  GLUCAP 77    Discharge time spent: greater than 30 minutes.  Signed: Kendell Bane, MD [facility] 05/24/2023

## 2023-05-24 NOTE — Telephone Encounter (Signed)
[employer] PA: Approved Authorization #[id number]  Tracking #[id number] Dates of service 05/24/2023 - 07/23/2023

## 2023-05-27 ENCOUNTER — Ambulatory Visit (HOSPITAL_COMMUNITY)
Admit: 2023-05-27 | Discharge: 2023-05-27 | Disposition: A | Discharge status: Home / Self Care | Payer: Medicare HMO | Attending: Gastroenterology | Admitting: Gastroenterology

## 2023-05-27 ENCOUNTER — Other Ambulatory Visit (HOSPITAL_COMMUNITY): Payer: Self-pay | Admitting: Gastroenterology

## 2023-05-27 DIAGNOSIS — R109 Unspecified abdominal pain: Secondary | ICD-10-CM | POA: Diagnosis not present

## 2023-05-27 DIAGNOSIS — K838 Other specified diseases of biliary tract: Secondary | ICD-10-CM | POA: Diagnosis not present

## 2023-05-27 DIAGNOSIS — K5730 Diverticulosis of large intestine without perforation or abscess without bleeding: Secondary | ICD-10-CM | POA: Diagnosis not present

## 2023-05-27 DIAGNOSIS — R1110 Vomiting, unspecified: Secondary | ICD-10-CM | POA: Diagnosis not present

## 2023-05-27 MED ORDER — GADOBUTROL 1 MMOL/ML IV SOLN
7.0000 mL | Freq: Once | INTRAVENOUS | Status: AC | PRN
  Administered 2023-05-27: 7 mL via INTRAVENOUS

## 2023-05-28 NOTE — Telephone Encounter (Addendum)
Spoke with pt. She has been scheduled for EGD +/- ed WITH Dr. Jena Gauss ASA 3 10/9. Aware will send instructions.pre-op via mychart.    PA approved via [employer].  DOS: 06/20/2023 - 08/20/2023, Authorization #[id number]

## 2023-05-29 ENCOUNTER — Encounter: Payer: Self-pay | Admitting: *Deleted

## 2023-05-29 DIAGNOSIS — M26609 Unspecified temporomandibular joint disorder, unspecified side: Secondary | ICD-10-CM | POA: Diagnosis not present

## 2023-05-29 DIAGNOSIS — M5136 Other intervertebral disc degeneration, lumbar region: Secondary | ICD-10-CM | POA: Diagnosis not present

## 2023-05-29 DIAGNOSIS — K521 Toxic gastroenteritis and colitis: Secondary | ICD-10-CM | POA: Diagnosis not present

## 2023-05-29 DIAGNOSIS — E663 Overweight: Secondary | ICD-10-CM | POA: Diagnosis not present

## 2023-05-29 DIAGNOSIS — G453 Amaurosis fugax: Secondary | ICD-10-CM | POA: Diagnosis not present

## 2023-05-29 DIAGNOSIS — F411 Generalized anxiety disorder: Secondary | ICD-10-CM | POA: Diagnosis not present

## 2023-05-29 DIAGNOSIS — G894 Chronic pain syndrome: Secondary | ICD-10-CM | POA: Diagnosis not present

## 2023-05-29 DIAGNOSIS — Z6828 Body mass index (BMI) 28.0-28.9, adult: Secondary | ICD-10-CM | POA: Diagnosis not present

## 2023-05-29 DIAGNOSIS — M5412 Radiculopathy, cervical region: Secondary | ICD-10-CM | POA: Diagnosis not present

## 2023-06-14 DIAGNOSIS — I671 Cerebral aneurysm, nonruptured: Secondary | ICD-10-CM | POA: Diagnosis not present

## 2023-06-14 DIAGNOSIS — E663 Overweight: Secondary | ICD-10-CM | POA: Diagnosis not present

## 2023-06-14 DIAGNOSIS — Z6828 Body mass index (BMI) 28.0-28.9, adult: Secondary | ICD-10-CM | POA: Diagnosis not present

## 2023-06-14 DIAGNOSIS — G894 Chronic pain syndrome: Secondary | ICD-10-CM | POA: Diagnosis not present

## 2023-06-14 DIAGNOSIS — D518 Other vitamin B12 deficiency anemias: Secondary | ICD-10-CM | POA: Diagnosis not present

## 2023-06-14 DIAGNOSIS — M5412 Radiculopathy, cervical region: Secondary | ICD-10-CM | POA: Diagnosis not present

## 2023-06-14 DIAGNOSIS — F1120 Opioid dependence, uncomplicated: Secondary | ICD-10-CM | POA: Diagnosis not present

## 2023-06-18 ENCOUNTER — Encounter (HOSPITAL_COMMUNITY)
Admission: RE | Admit: 2023-06-18 | Discharge: 2023-06-18 | Disposition: A | Discharge status: Home / Self Care | Payer: Medicare HMO | Source: Ambulatory Visit | Attending: Internal Medicine | Admitting: Internal Medicine

## 2023-06-18 ENCOUNTER — Encounter (HOSPITAL_COMMUNITY): Payer: Self-pay

## 2023-06-18 ENCOUNTER — Other Ambulatory Visit: Payer: Self-pay

## 2023-06-18 NOTE — Pre-Procedure Instructions (Signed)
Attempted pre-op phonecall. Left VM for her to call us back. 

## 2023-06-18 NOTE — Patient Instructions (Signed)
Gina Wolfe  06/18/2023     @PREFPERIOPPHARMACY @   Your procedure is scheduled on  06/20/2023.   Report to [facility] at  0845  A.M.   Call this number if you have problems the morning of surgery:  [phone]  If you experience any cold or flu symptoms such as cough, fever, chills, shortness of breath, etc. between now and your scheduled surgery, please notify us at the above number.   Remember:  Follow the diet instructions given to you by the office.       Use your inhaler before you come and bring your rescue inhaler with you.     Take these medicines the morning of surgery with A SIP OF WATER        metoprolol, zofran (if needed), pantoprazole, oxycodone.     Do not wear jewelry, make-up or nail polish, including gel polish,  artificial nails, or any other type of covering on natural nails (fingers and  toes).  Do not wear lotions, powders, or perfumes, or deodorant.  Do not shave 48 hours prior to surgery.  Men may shave face and neck.  Do not bring valuables to the hospital.  [facility] is not responsible for any belongings or valuables.  Contacts, dentures or bridgework may not be worn into surgery.  Leave your suitcase in the car.  After surgery it may be brought to your room.  For patients admitted to the hospital, discharge time will be determined by your treatment team.  Patients discharged the day of surgery will not be allowed to drive home and must have someone with them for 24 hours.    Special instructions:   DO NOT smoke tobacco or vape for 24 hours before your procedure.  Please read over the following fact sheets that you were given. Anesthesia Post-op Instructions and Care and Recovery After Surgery      Upper Endoscopy, Adult, Care After After the procedure, it is common to have a sore throat. It is also common to have: Mild stomach pain or discomfort. Bloating. Nausea. Follow these instructions at home: The instructions below  may help you care for yourself at home. Your health care provider may give you more instructions. If you have questions, ask your health care provider. If you were given a sedative during the procedure, it can affect you for several hours. Do not drive or operate machinery until your health care provider says that it is safe. If you will be going home right after the procedure, plan to have a responsible adult: Take you home from the hospital or clinic. You will not be allowed to drive. Care for you for the time you are told. Follow instructions from your health care provider about what you may eat and drink. Return to your normal activities as told by your health care provider. Ask your health care provider what activities are safe for you. Take over-the-counter and prescription medicines only as told by your health care provider. Contact a health care provider if you: Have a sore throat that lasts longer than one day. Have trouble swallowing. Have a fever. Get help right away if you: Vomit blood or your vomit looks like coffee grounds. Have bloody, black, or tarry stools. Have a very bad sore throat or you cannot swallow. Have difficulty breathing or very bad pain in your chest or abdomen. These symptoms may be an emergency. Get help right away. Call 911. Do not wait to  see if the symptoms will go away. Do not drive yourself to the hospital. Summary After the procedure, it is common to have a sore throat, mild stomach discomfort, bloating, and nausea. If you were given a sedative during the procedure, it can affect you for several hours. Do not drive until your health care provider says that it is safe. Follow instructions from your health care provider about what you may eat and drink. Return to your normal activities as told by your health care provider. This information is not intended to replace advice given to you by your health care provider. Make sure you discuss any questions you  have with your health care provider. Document Revised: 12/07/2021 Document Reviewed: 12/07/2021 Elsevier Patient Education  2024 [employer] Monitored Anesthesia Care, Care After The following information offers guidance on how to care for yourself after your procedure. Your health care provider may also give you more specific instructions. If you have problems or questions, contact your health care provider. What can I expect after the procedure? After the procedure, it is common to have: Tiredness. Little or no memory about what happened during or after the procedure. Impaired judgment when it comes to making decisions. Nausea or vomiting. Some trouble with balance. Follow these instructions at home: For the time period you were told by your health care provider:  Rest. Do not participate in activities where you could fall or become injured. Do not drive or use machinery. Do not drink alcohol. Do not take sleeping pills or medicines that cause drowsiness. Do not make important decisions or sign legal documents. Do not take care of children on your own. Medicines Take over-the-counter and prescription medicines only as told by your health care provider. If you were prescribed antibiotics, take them as told by your health care provider. Do not stop using the antibiotic even if you start to feel better. Eating and drinking Follow instructions from your health care provider about what you may eat and drink. Drink enough fluid to keep your urine pale yellow. If you vomit: Drink clear fluids slowly and in small amounts as you are able. Clear fluids include water, ice chips, low-calorie sports drinks, and fruit juice that has water added to it (diluted fruit juice). Eat light and bland foods in small amounts as you are able. These foods include bananas, applesauce, rice, lean meats, toast, and crackers. General instructions  Have a responsible adult stay with you for the time you are  told. It is important to have someone help care for you until you are awake and alert. If you have sleep apnea, surgery and some medicines can increase your risk for breathing problems. Follow instructions from your health care provider about wearing your sleep device: When you are sleeping. This includes during daytime naps. While taking prescription pain medicines, sleeping medicines, or medicines that make you drowsy. Do not use any products that contain nicotine or tobacco. These products include cigarettes, chewing tobacco, and vaping devices, such as e-cigarettes. If you need help quitting, ask your health care provider. Contact a health care provider if: You feel nauseous or vomit every time you eat or drink. You feel light-headed. You are still sleepy or having trouble with balance after 24 hours. You get a rash. You have a fever. You have redness or swelling around the IV site. Get help right away if: You have trouble breathing. You have new confusion after you get home. These symptoms may be an emergency. Get help right away.  Call 911. Do not wait to see if the symptoms will go away. Do not drive yourself to the hospital. This information is not intended to replace advice given to you by your health care provider. Make sure you discuss any questions you have with your health care provider. Document Revised: 01/23/2022 Document Reviewed: 01/23/2022 Elsevier Patient Education  2024 [employer].

## 2023-06-20 ENCOUNTER — Ambulatory Visit (HOSPITAL_COMMUNITY)
Admission: RE | Admit: 2023-06-20 | Discharge: 2023-06-20 | Disposition: A | Discharge status: Home / Self Care | Payer: Medicare HMO | Attending: Internal Medicine | Admitting: Internal Medicine

## 2023-06-20 ENCOUNTER — Encounter (HOSPITAL_COMMUNITY): Payer: Self-pay | Admitting: Internal Medicine

## 2023-06-20 ENCOUNTER — Ambulatory Visit (HOSPITAL_COMMUNITY): Payer: Medicare HMO | Admitting: Certified Registered"

## 2023-06-20 ENCOUNTER — Telehealth: Payer: Self-pay

## 2023-06-20 ENCOUNTER — Encounter (HOSPITAL_COMMUNITY)
Admission: RE | Disposition: A | Discharge status: Home / Self Care | Payer: Self-pay | Source: Home / Self Care | Attending: Internal Medicine

## 2023-06-20 ENCOUNTER — Ambulatory Visit (HOSPITAL_COMMUNITY): Payer: Self-pay | Admitting: Certified Registered"

## 2023-06-20 DIAGNOSIS — K2950 Unspecified chronic gastritis without bleeding: Secondary | ICD-10-CM | POA: Diagnosis not present

## 2023-06-20 DIAGNOSIS — K2100 Gastro-esophageal reflux disease with esophagitis, without bleeding: Secondary | ICD-10-CM | POA: Insufficient documentation

## 2023-06-20 DIAGNOSIS — J45909 Unspecified asthma, uncomplicated: Secondary | ICD-10-CM | POA: Insufficient documentation

## 2023-06-20 DIAGNOSIS — R1310 Dysphagia, unspecified: Secondary | ICD-10-CM | POA: Diagnosis not present

## 2023-06-20 DIAGNOSIS — Z87891 Personal history of nicotine dependence: Secondary | ICD-10-CM | POA: Insufficient documentation

## 2023-06-20 DIAGNOSIS — I10 Essential (primary) hypertension: Secondary | ICD-10-CM | POA: Insufficient documentation

## 2023-06-20 DIAGNOSIS — K222 Esophageal obstruction: Secondary | ICD-10-CM | POA: Insufficient documentation

## 2023-06-20 DIAGNOSIS — R12 Heartburn: Secondary | ICD-10-CM | POA: Diagnosis present

## 2023-06-20 DIAGNOSIS — K3189 Other diseases of stomach and duodenum: Secondary | ICD-10-CM

## 2023-06-20 HISTORY — PX: BIOPSY: SHX5522

## 2023-06-20 HISTORY — PX: ESOPHAGOGASTRODUODENOSCOPY (EGD) WITH PROPOFOL: SHX5813

## 2023-06-20 HISTORY — PX: MALONEY DILATION: SHX5535

## 2023-06-20 SURGERY — ESOPHAGOGASTRODUODENOSCOPY (EGD) WITH PROPOFOL
Anesthesia: General

## 2023-06-20 MED ORDER — SODIUM CHLORIDE 0.9% FLUSH
INTRAVENOUS | Status: DC | PRN
Start: 2023-06-20 — End: 2023-06-20
  Administered 2023-06-20: 80 mL via INTRAVENOUS

## 2023-06-20 MED ORDER — PANTOPRAZOLE SODIUM 40 MG PO TBEC: 40.0000 mg | DELAYED_RELEASE_TABLET | Freq: Two times a day (BID) | ORAL | 11 refills | Status: DC

## 2023-06-20 MED ORDER — PROPOFOL 10 MG/ML IV BOLUS
INTRAVENOUS | Status: DC | PRN
  Administered 2023-06-20: 50 mg via INTRAVENOUS
  Administered 2023-06-20: 100 mg via INTRAVENOUS

## 2023-06-20 MED ORDER — PROPOFOL 500 MG/50ML IV EMUL
INTRAVENOUS | Status: DC | PRN
  Administered 2023-06-20: 150 ug/kg/min via INTRAVENOUS

## 2023-06-20 MED ORDER — LIDOCAINE HCL (CARDIAC) PF 100 MG/5ML IV SOSY
PREFILLED_SYRINGE | INTRAVENOUS | Status: DC | PRN
  Administered 2023-06-20: 80 mg via INTRAVENOUS

## 2023-06-20 NOTE — Transfer of Care (Addendum)
Immediate Anesthesia Transfer of Care Note  Patient: Gina Wolfe  Procedure(s) Performed: ESOPHAGOGASTRODUODENOSCOPY (EGD) WITH PROPOFOL MALONEY DILATION BIOPSY  Patient Location: Short Stay  Anesthesia Type:General  Level of Consciousness: awake and patient cooperative  Airway & Oxygen Therapy: Patient Spontanous Breathing  Post-op Assessment: Report given to RN and Post -op Vital signs reviewed and stable  Post vital signs: Reviewed and stable  Last Vitals:  Vitals Value Taken Time  BP 126/79 06/20/23   1033  Temp 36.4 06/20/23   1033  Pulse 65 06/20/23   1033  Resp 16 06/20/23   1033  SpO2 99% 06/20/23   1033    Last Pain:  Vitals:   06/20/23 1015  TempSrc:   PainSc: 0-No pain         Complications: No notable events documented.

## 2023-06-20 NOTE — Telephone Encounter (Signed)
-----   Message from Eula Listen sent at 06/20/2023 10:36 AM EDT -----   Patient needs new prescription Protonix 40 mg pill.  Dispense 60 with 11 refills.  Take 140 mg pill twice daily 30 minutes for breakfast and supper.  Thanks.

## 2023-06-20 NOTE — Discharge Instructions (Signed)
EGD Discharge instructions Please read the instructions outlined below and refer to this sheet in the next few weeks. These discharge instructions provide you with general information on caring for yourself after you leave the hospital. Your doctor may also give you specific instructions. While your treatment has been planned according to the most current medical practices available, unavoidable complications occasionally occur. If you have any problems or questions after discharge, please call your doctor. ACTIVITY You may resume your regular activity but move at a slower pace for the next 24 hours.  Take frequent rest periods for the next 24 hours.  Walking will help expel (get rid of) the air and reduce the bloated feeling in your abdomen.  No driving for 24 hours (because of the anesthesia (medicine) used during the test).  You may shower.  Do not sign any important legal documents or operate any machinery for 24 hours (because of the anesthesia used during the test).  NUTRITION Drink plenty of fluids.  You may resume your normal diet.  Begin with a light meal and progress to your normal diet.  Avoid alcoholic beverages for 24 hours or as instructed by your caregiver.  MEDICATIONS You may resume your normal medications unless your caregiver tells you otherwise.  WHAT YOU CAN EXPECT TODAY You may experience abdominal discomfort such as a feeling of fullness or "gas" pains.  FOLLOW-UP Your doctor will discuss the results of your test with you.  SEEK IMMEDIATE MEDICAL ATTENTION IF ANY OF THE FOLLOWING OCCUR: Excessive nausea (feeling sick to your stomach) and/or vomiting.  Severe abdominal pain and distention (swelling).  Trouble swallowing.  Temperature over 101 F (37.8 C).  Rectal bleeding or vomiting of blood.       You are found to have a Schatzki's ring in your esophagus-and narrowing related to acid reflux.  He also had some acid reflux irritation in your esophagus.  Your  esophagus was stretched  Your stomach was mildly inflamed.  Biopsies were taken.  We are increasing your Protonix to 40 mg twice daily.  A new prescription has been called into your pharmacy through the office.  Further recommendations to follow pending review of pathology report.   You asked about the dilation of your bile duct prior to the procedure.  To confirm, you did have an MRI that showed your bile duct was normal.  No further evaluation warranted there.   office visit with [place] in 3 months.  At patient request, I called Brett Canales at [phone] findings and recommendations

## 2023-06-20 NOTE — Op Note (Signed)
[facility] Patient Name: Gina Wolfe Procedure Date: 06/20/2023 9:52 AM MRN: [id number] Date of Birth: May 02, 1956 Attending MD: Gennette Pac , MD, [id number] CSN: [id number] Age: 67 Admit Type: Outpatient Procedure:                Upper GI endoscopy Indications:              Dysphagia, Heartburn Providers:                Gennette Pac, MD, Francoise Ceo RN, RN, Angelica Ran, Lennice Sites Technician, Technician Referring MD:              Medicines:                Propofol per Anesthesia Complications:            No immediate complications. Estimated Blood Loss:     Estimated blood loss was minimal. Procedure:                Pre-Anesthesia Assessment:                           - Prior to the procedure, a History and Physical                            was performed, and patient medications and                            allergies were reviewed. The patient's tolerance of                            previous anesthesia was also reviewed. The risks                            and benefits of the procedure and the sedation                            options and risks were discussed with the patient.                            All questions were answered, and informed consent                            was obtained. Prior Anticoagulants: The patient has                            taken no anticoagulant or antiplatelet agents. ASA                            Grade Assessment: III - A patient with severe                            systemic disease. After reviewing the risks and  benefits, the patient was deemed in satisfactory                            condition to undergo the procedure.                           After obtaining informed consent, the endoscope was                            passed under direct vision. Throughout the                            procedure, the patient's blood pressure, pulse, and                             oxygen saturations were monitored continuously. The                            GIF-H190 ([id number]) scope was introduced through the                            mouth, and advanced to the second part of duodenum.                            The upper GI endoscopy was accomplished without                            difficulty. The patient tolerated the procedure                            well. Scope In: 10:19:54 AM Scope Out: 10:26:36 AM Total Procedure Duration: 0 hours 6 minutes 42 seconds  Findings:      Single 5 mm erosions straddling the GE junction. Noncritical appearing       Schatzki's ring no mass no Barrett's epithelium seen.      Gastric cavity empty. Patchy gastric erythema superficial erosions. No       ulcer or infiltrating process. Patent pylorus.      The duodenal bulb and second portion of the duodenum were normal. Scope       was withdrawn and a 54 French Maloney dilator was passed to full       insertion with mild resistance. A look back revealed the ring had been       nicely disrupted with minimal bleeding and without apparent       complication. Finally, biopsies of the gastric antrum and body were       taken for histologic study Impression:               - Mild erosive reflux esophagitis as described.                            Schatzki's ring status post dilation.                           -Abnormal gastric mucosa of uncertain  significance?"status post biopsy.                           -Normal duodenal bulb and second portion of the                            duodenum. Moderate Sedation:      Moderate (conscious) sedation was personally administered by an       anesthesia professional. The following parameters were monitored: oxygen       saturation, heart rate, blood pressure, respiratory rate, EKG, adequacy       of pulmonary ventilation, and response to care. Recommendation:           - Patient has a contact number available  for                            emergencies. The signs and symptoms of potential                            delayed complications were discussed with the                            patient. Return to normal activities tomorrow.                            Written discharge instructions were provided to the                            patient.                           - Advance diet as tolerated. Increase Protonix to                            40 mg twice daily. Follow-up on pathology. Office                            visit with [place] in 3 months. Procedure Code(s):        --- Professional ---                           [id number], Esophagogastroduodenoscopy, flexible,                            transoral; diagnostic, including collection of                            specimen(s) by brushing or washing, when performed                            (separate procedure) Diagnosis Code(s):        --- Professional ---                           R13.10, Dysphagia, unspecified  R12, Heartburn CPT copyright 2022 American Medical Association. All rights reserved. The codes documented in this report are preliminary and upon coder review may  be revised to meet current compliance requirements. Gerrit Friends. Eliyana Pagliaro, MD Gennette Pac, MD 06/20/2023 10:45:16 AM This report has been signed electronically. Number of Addenda: 0

## 2023-06-20 NOTE — Telephone Encounter (Signed)
Rx sent to pharmacy on file.

## 2023-06-20 NOTE — H&P (Signed)
@LOGO @   Primary Care Physician:  Elfredia Nevins, MD Primary Gastroenterologist:  Dr. Jena Gauss  Pre-Procedure History & Physical: HPI:  Gina Wolfe is a 67 y.o. female here for further evaluation of dysphagia in the setting of poorly controlled GERD.  She is here for an EGD with possible esophageal dilation is feasible/appropriate.  Past Medical History:  Diagnosis Date   Arthritis    Asthma    Chronic pain syndrome    Diverticulitis    GERD (gastroesophageal reflux disease)    History of palpitations    Hypertension    IBS (irritable bowel syndrome)    PONV (postoperative nausea and vomiting)    Spinal headache     Past Surgical History:  Procedure Laterality Date   ABDOMINAL HYSTERECTOMY     BACK SURGERY     x3 previous lumbar, x3 cervical surgeries   CHOLECYSTECTOMY     COLONOSCOPY  05/20/10   BMW:UXLKGMWN hemorrhoids otherwise normal/EGD:distal esophageal erosions otherwise normal   COLONOSCOPY WITH PROPOFOL N/A 08/15/2021   Procedure: COLONOSCOPY WITH PROPOFOL;  Surgeon: Corbin Ade, MD;  Location: [facility];  Service: Endoscopy;  Laterality: N/A;  7:30am   CYST EXCISION Right    Eye cyst   EYE SURGERY Right    removed a cyst   IR ANGIO INTRA EXTRACRAN SEL COM CAROTID INNOMINATE UNI L MOD SED  09/29/2022   IR ANGIO INTRA EXTRACRAN SEL INTERNAL CAROTID BILAT MOD SED  02/21/2022   IR ANGIO INTRA EXTRACRAN SEL INTERNAL CAROTID UNI R MOD SED  03/27/2022   IR ANGIO INTRA EXTRACRAN SEL INTERNAL CAROTID UNI R MOD SED  09/29/2022   IR ANGIO VERTEBRAL SEL VERTEBRAL UNI R MOD SED  02/21/2022   IR ANGIOGRAM FOLLOW UP STUDY  03/27/2022   IR NEURO EACH ADD'L AFTER BASIC UNI RIGHT (MS)  03/27/2022   IR TRANSCATH/EMBOLIZ  03/24/2022   IR US GUIDE VASC ACCESS RIGHT  09/29/2022   LUMBAR LAMINECTOMY/DECOMPRESSION MICRODISCECTOMY N/A 07/13/2016   Procedure: DISCECTOMY Lumbar four-five;  Surgeon: Tressie Stalker, MD;  Location: [facility] OR;  Service: Neurosurgery;  Laterality: N/A;   MENISCUS  REPAIR  2007   R knee    POSTERIOR CERVICAL FUSION/FORAMINOTOMY N/A 04/10/2013   Procedure: POSTERIOR CERVICAL FUSION/FORAMINOTOMY LEVEL 2;  Surgeon: Emilee Hero, MD;  Location: [facility] OR;  Service: Orthopedics;  Laterality: N/A;  Posterior cervical decompression fusion, cervical 5-6, cervical 6-7 with instrumentation and allograft.   RADIOLOGY WITH ANESTHESIA Right 03/24/2022   Procedure: Pipeline Embolization of Right ICA Aneurysm;  Surgeon: Lisbeth Renshaw, MD;  Location[facility] OR;  Service: Radiology;  Laterality: Right;   TOTAL KNEE ARTHROPLASTY Right 01/03/2021   Procedure: TOTAL KNEE ARTHROPLASTY;  Surgeon: Ollen Gross, MD;  Location: [facility] ORS;  Service: Orthopedics;  Laterality: Right;    TUBAL LIGATION      Prior to Admission medications   Medication Sig Start Date End Date Taking? Authorizing Provider  albuterol (PROVENTIL HFA;VENTOLIN HFA) 108 (90 Base) MCG/ACT inhaler Inhale 2 puffs into the lungs every 6 (six) hours as needed for wheezing or shortness of breath.    Yes [provider]  alum & mag hydroxide-simeth (MAALOX MAX) 400-400-40 MG/5ML suspension Take 10 mLs by mouth every 6 (six) hours as needed for indigestion. 05/24/23  Yes Shahmehdi, Seyed A, MD  aspirin EC 81 MG tablet Take 81 mg by mouth daily. Swallow whole.   Yes [provider]  furosemide (LASIX) 20 MG tablet Take 20 mg by mouth daily as needed  for edema.    Yes [provider]  linaclotide (LINZESS) 290 MCG CAPS capsule Take 290 mcg by mouth daily.   Yes [provider]  metoprolol succinate (TOPROL XL) 25 MG 24 hr tablet Take 1 tablet (25 mg total) by mouth daily. 11/02/22  Yes Jonelle Sidle, MD  ondansetron (ZOFRAN-ODT) 8 MG disintegrating tablet Take 1 tablet (8 mg total) by mouth every 8 (eight) hours as needed for nausea or vomiting. 05/24/23  Yes Shahmehdi, Gemma Payor, MD  oxyCODONE-acetaminophen (PERCOCET) 10-325 MG tablet Take 1 tablet by mouth every 4 (four)  hours as needed for pain. 05/02/21  Yes [provider]  pantoprazole (PROTONIX) 40 MG tablet Take 1 tablet (40 mg total) by mouth daily. 05/24/23 06/23/23 Yes Shahmehdi, Gemma Payor, MD  cholecalciferol (VITAMIN D3) 25 MCG (1000 UNIT) tablet Take 1,000 Units by mouth daily.    [provider]  estrogens, conjugated, (PREMARIN) 0.625 MG tablet Take 0.625 mg by mouth daily with breakfast.     [provider]  methylPREDNISolone (MEDROL DOSEPAK) 4 MG TBPK tablet TAKE 6 TABLETS ON DAY 1 AS DIRECTED ON PACKAGE AND DECREASE BY 1 TAB EACH DAY FOR A TOTAL OF 6 DAYS 05/16/23   [provider]  promethazine (PHENERGAN) 25 MG tablet Take 25 mg by mouth every 6 (six) hours as needed for nausea or vomiting.    [provider]    Allergies as of 05/28/2023 - Review Complete 05/27/2023  Allergen Reaction Noted   Clindamycin hcl Anaphylaxis    Codeine Nausea Only     Family History  Problem Relation Age of Onset   Hypertension Mother    Emphysema Mother    Cancer Father    Hypertension Other    Diabetes Other    Colon cancer Neg Hx    Colon polyps Neg Hx     Social History   Socioeconomic History   Marital status: Married    Spouse name: Not on file   Number of children: 3   Years of education: Not on file   Highest education level: Not on file  Occupational History   Not on file  Tobacco Use   Smoking status: Former    Current packs/day: 0.00    Types: Cigarettes    Quit date: 08/25/2007    Years since quitting: 15.8   Smokeless tobacco: Never  Vaping Use   Vaping status: Never Used  Substance and Sexual Activity   Alcohol use: No    Alcohol/week: 0.0 standard drinks of alcohol   Drug use: No   Sexual activity: Yes  Other Topics Concern   Not on file  Social History Narrative   Not on file   Social Determinants of Health   Financial Resource Strain: Not on file  Food Insecurity: No Food Insecurity (05/23/2023)   Hunger Vital Sign     Worried About Running Out of Food in the Last Year: Never true    Ran Out of Food in the Last Year: Never true  Transportation Needs: No Transportation Needs (05/23/2023)   PRAPARE - Administrator, Civil Service (Medical): No    Lack of Transportation (Non-Medical): No  Physical Activity: Not on file  Stress: Not on file  Social Connections: Not on file  Intimate Partner Violence: Not At Risk (05/23/2023)   Humiliation, Afraid, Rape, and Kick questionnaire    Fear of Current or Ex-Partner: No    Emotionally Abused: No    Physically Abused: No  Sexually Abused: No    Review of Systems: See HPI, otherwise negative ROS  Physical Exam: BP 131/81   Pulse 70   Temp 98 F (36.7 C) (Oral)   Resp 16   Ht 5\' 2"  (1.575 m)   Wt 71.6 kg   SpO2 98%   BMI 28.87 kg/m  General:   Alert,  Well-developed, well-nourished, pleasant and cooperative in NAD Neck:  Supple; no masses or thyromegaly. No significant cervical adenopathy. Lungs:  Clear throughout to auscultation.   No wheezes, crackles, or rhonchi. No acute distress. Heart:  Regular rate and rhythm; no murmurs, clicks, rubs,  or gallops. Abdomen: Non-distended, normal bowel sounds.  Soft and nontender without appreciable mass or hepatosplenomegaly.  Impression/Plan: 67 year old lady with poorly controlled GERD and esophageal dysphagia.  She is here for an EGD with possible esophageal dilation is feasible/appropriate per plan. The risks, benefits, limitations, alternatives and imponderables have been reviewed with the patient. Potential for esophageal dilation, biopsy, etc. have also been reviewed.  Questions have been answered. All parties agreeable.    Notice: This dictation was prepared with Dragon dictation along with smaller phrase technology. Any transcriptional errors that result from this process are unintentional and may not be corrected upon review.

## 2023-06-20 NOTE — Anesthesia Preprocedure Evaluation (Signed)
Anesthesia Evaluation  Patient identified by MRN, date of birth, ID band Patient awake    Reviewed: Allergy & Precautions, H&P , NPO status , Patient's Chart, lab work & pertinent test results, reviewed documented beta blocker date and time   History of Anesthesia Complications (+) PONV, POST - OP SPINAL HEADACHE and history of anesthetic complications  Airway Mallampati: II  TM Distance: >3 FB Neck ROM: full    Dental no notable dental hx.    Pulmonary neg pulmonary ROS, shortness of breath, asthma , former smoker   Pulmonary exam normal breath sounds clear to auscultation       Cardiovascular Exercise Tolerance: Good hypertension, negative cardio ROS  Rhythm:regular Rate:Normal     Neuro/Psych  Headaches negative neurological ROS  negative psych ROS   GI/Hepatic negative GI ROS, Neg liver ROS,GERD  ,,  Endo/Other  negative endocrine ROS    Renal/GU negative Renal ROS  negative genitourinary   Musculoskeletal   Abdominal   Peds  Hematology negative hematology ROS (+)   Anesthesia Other Findings   Reproductive/Obstetrics negative OB ROS                             Anesthesia Physical Anesthesia Plan  ASA: 2  Anesthesia Plan: General   Post-op Pain Management:    Induction:   PONV Risk Score and Plan: Propofol infusion  Airway Management Planned:   Additional Equipment:   Intra-op Plan:   Post-operative Plan:   Informed Consent: I have reviewed the patients History and Physical, chart, labs and discussed the procedure including the risks, benefits and alternatives for the proposed anesthesia with the patient or authorized representative who has indicated his/her understanding and acceptance.     Dental Advisory Given  Plan Discussed with: CRNA  Anesthesia Plan Comments:        Anesthesia Quick Evaluation

## 2023-06-21 LAB — SURGICAL PATHOLOGY

## 2023-06-22 NOTE — Anesthesia Postprocedure Evaluation (Signed)
Anesthesia Post Note  Patient: Gina Wolfe  Procedure(s) Performed: ESOPHAGOGASTRODUODENOSCOPY (EGD) WITH PROPOFOL MALONEY DILATION BIOPSY  Patient location during evaluation: Phase II Anesthesia Type: General Level of consciousness: awake Pain management: pain level controlled Vital Signs Assessment: post-procedure vital signs reviewed and stable Respiratory status: spontaneous breathing and respiratory function stable Cardiovascular status: blood pressure returned to baseline and stable Postop Assessment: no headache and no apparent nausea or vomiting Anesthetic complications: no Comments: Late entry   No notable events documented.   Last Vitals:  Vitals:   06/20/23 0859 06/20/23 1033  BP: 131/81 126/79  Pulse: 70 65  Resp: 16 16  Temp: 36.7 C (!) 36.4 C  SpO2: 98% 99%    Last Pain:  Vitals:   06/20/23 1033  TempSrc: Oral  PainSc: 0-No pain                 Windell Norfolk

## 2023-06-25 ENCOUNTER — Encounter: Payer: Self-pay | Admitting: Internal Medicine

## 2023-06-25 DIAGNOSIS — M5134 Other intervertebral disc degeneration, thoracic region: Secondary | ICD-10-CM | POA: Diagnosis not present

## 2023-06-25 DIAGNOSIS — K5732 Diverticulitis of large intestine without perforation or abscess without bleeding: Secondary | ICD-10-CM | POA: Diagnosis not present

## 2023-06-27 DIAGNOSIS — I671 Cerebral aneurysm, nonruptured: Secondary | ICD-10-CM | POA: Diagnosis not present

## 2023-06-28 ENCOUNTER — Encounter (HOSPITAL_COMMUNITY): Payer: Self-pay | Admitting: Internal Medicine

## 2023-07-13 DIAGNOSIS — F1120 Opioid dependence, uncomplicated: Secondary | ICD-10-CM | POA: Diagnosis not present

## 2023-07-13 DIAGNOSIS — G894 Chronic pain syndrome: Secondary | ICD-10-CM | POA: Diagnosis not present

## 2023-07-13 DIAGNOSIS — M5412 Radiculopathy, cervical region: Secondary | ICD-10-CM | POA: Diagnosis not present

## 2023-07-13 DIAGNOSIS — M5134 Other intervertebral disc degeneration, thoracic region: Secondary | ICD-10-CM | POA: Diagnosis not present

## 2023-07-26 ENCOUNTER — Other Ambulatory Visit: Payer: Self-pay | Admitting: Internal Medicine

## 2023-07-26 DIAGNOSIS — Z1231 Encounter for screening mammogram for malignant neoplasm of breast: Secondary | ICD-10-CM

## 2023-07-27 DIAGNOSIS — Z0001 Encounter for general adult medical examination with abnormal findings: Secondary | ICD-10-CM | POA: Diagnosis not present

## 2023-07-27 DIAGNOSIS — M5412 Radiculopathy, cervical region: Secondary | ICD-10-CM | POA: Diagnosis not present

## 2023-07-27 DIAGNOSIS — G894 Chronic pain syndrome: Secondary | ICD-10-CM | POA: Diagnosis not present

## 2023-07-27 DIAGNOSIS — I872 Venous insufficiency (chronic) (peripheral): Secondary | ICD-10-CM | POA: Diagnosis not present

## 2023-07-27 DIAGNOSIS — M159 Polyosteoarthritis, unspecified: Secondary | ICD-10-CM | POA: Diagnosis not present

## 2023-07-27 DIAGNOSIS — F1120 Opioid dependence, uncomplicated: Secondary | ICD-10-CM | POA: Diagnosis not present

## 2023-07-27 DIAGNOSIS — Z23 Encounter for immunization: Secondary | ICD-10-CM | POA: Diagnosis not present

## 2023-07-27 DIAGNOSIS — K219 Gastro-esophageal reflux disease without esophagitis: Secondary | ICD-10-CM | POA: Diagnosis not present

## 2023-07-27 DIAGNOSIS — M5134 Other intervertebral disc degeneration, thoracic region: Secondary | ICD-10-CM | POA: Diagnosis not present

## 2023-07-30 ENCOUNTER — Ambulatory Visit
Admission: RE | Admit: 2023-07-30 | Discharge: 2023-07-30 | Disposition: A | Discharge status: Home / Self Care | Payer: Medicare HMO | Source: Ambulatory Visit | Attending: Internal Medicine | Admitting: Internal Medicine

## 2023-07-30 DIAGNOSIS — Z1231 Encounter for screening mammogram for malignant neoplasm of breast: Secondary | ICD-10-CM | POA: Diagnosis not present

## 2023-08-07 DIAGNOSIS — G894 Chronic pain syndrome: Secondary | ICD-10-CM | POA: Diagnosis not present

## 2023-08-07 DIAGNOSIS — M5412 Radiculopathy, cervical region: Secondary | ICD-10-CM | POA: Diagnosis not present

## 2023-08-07 DIAGNOSIS — M5134 Other intervertebral disc degeneration, thoracic region: Secondary | ICD-10-CM | POA: Diagnosis not present

## 2023-08-07 DIAGNOSIS — F1120 Opioid dependence, uncomplicated: Secondary | ICD-10-CM | POA: Diagnosis not present

## 2023-09-27 DIAGNOSIS — G894 Chronic pain syndrome: Secondary | ICD-10-CM | POA: Diagnosis not present

## 2023-09-27 DIAGNOSIS — J329 Chronic sinusitis, unspecified: Secondary | ICD-10-CM | POA: Diagnosis not present

## 2023-09-27 DIAGNOSIS — F1120 Opioid dependence, uncomplicated: Secondary | ICD-10-CM | POA: Diagnosis not present

## 2023-09-27 DIAGNOSIS — M5134 Other intervertebral disc degeneration, thoracic region: Secondary | ICD-10-CM | POA: Diagnosis not present

## 2023-11-19 DIAGNOSIS — H52203 Unspecified astigmatism, bilateral: Secondary | ICD-10-CM | POA: Diagnosis not present

## 2023-11-19 DIAGNOSIS — G453 Amaurosis fugax: Secondary | ICD-10-CM | POA: Diagnosis not present

## 2023-11-23 DIAGNOSIS — F411 Generalized anxiety disorder: Secondary | ICD-10-CM | POA: Diagnosis not present

## 2023-11-23 DIAGNOSIS — E663 Overweight: Secondary | ICD-10-CM | POA: Diagnosis not present

## 2023-11-23 DIAGNOSIS — M5412 Radiculopathy, cervical region: Secondary | ICD-10-CM | POA: Diagnosis not present

## 2023-11-23 DIAGNOSIS — Z6828 Body mass index (BMI) 28.0-28.9, adult: Secondary | ICD-10-CM | POA: Diagnosis not present

## 2023-11-23 DIAGNOSIS — M159 Polyosteoarthritis, unspecified: Secondary | ICD-10-CM | POA: Diagnosis not present

## 2023-11-23 DIAGNOSIS — F1120 Opioid dependence, uncomplicated: Secondary | ICD-10-CM | POA: Diagnosis not present

## 2023-12-04 ENCOUNTER — Ambulatory Visit: Payer: Medicare HMO | Admitting: Cardiology

## 2024-01-09 DIAGNOSIS — M5134 Other intervertebral disc degeneration, thoracic region: Secondary | ICD-10-CM | POA: Diagnosis not present

## 2024-01-09 DIAGNOSIS — M5412 Radiculopathy, cervical region: Secondary | ICD-10-CM | POA: Diagnosis not present

## 2024-01-09 DIAGNOSIS — K219 Gastro-esophageal reflux disease without esophagitis: Secondary | ICD-10-CM | POA: Diagnosis not present

## 2024-01-09 DIAGNOSIS — J042 Acute laryngotracheitis: Secondary | ICD-10-CM | POA: Diagnosis not present

## 2024-01-23 ENCOUNTER — Encounter: Payer: Self-pay | Admitting: Student

## 2024-01-23 ENCOUNTER — Ambulatory Visit: Attending: Student | Admitting: Student

## 2024-01-23 VITALS — BP 130/70 | HR 64 | Ht 60.0 in | Wt 152.0 lb

## 2024-01-23 DIAGNOSIS — R002 Palpitations: Secondary | ICD-10-CM

## 2024-01-23 DIAGNOSIS — Z87898 Personal history of other specified conditions: Secondary | ICD-10-CM

## 2024-01-23 MED ORDER — METOPROLOL SUCCINATE ER 25 MG PO TB24: 25.0000 mg | ORAL_TABLET | Freq: Every day | ORAL | 3 refills | Status: DC

## 2024-01-23 NOTE — Progress Notes (Signed)
 Cardiology Office Note    Date:  01/23/2024  ID:  Gina Wolfe, DOB [birth year], MRN [id number] Cardiologist: Teddie Favre, MD    History of Present Illness:    Gina Wolfe is a 68 y.o. female with past medical history of palpitations (PAC's, PVC's and brief SVT by prior monitor), IBS, asthma, arthritis and Coronary CTA in 02/2022 showing normal coronary arteries who presents to the office today for annual follow-up.  She was last examined by Dr. Londa Wolfe in 10/2022 and reported still having intermittent palpitations but denied any chest pain. Given her palpitations, Toprol -XL was increased to 25 mg daily.  In the interim, she was admitted to [facility] in 05/2023 for enteritis and no changes were made to her cardiac medications at that time. Ultimately underwent MRCP which showed no choledocholithiasis or obstructing lesions. GI pursued endoscopy as an outpatient in 06/2023 which showed mild erosive esophagitis and Schatzki's ring requiring dilation.  In talking with the patient today, she reports overall doing well since her last office visit. She is raising her 87 year old son who has autism and remains active with him as he is homeschooled and is involved in several extracurricular activities. She denies any recent chest pain or dyspnea on exertion when performing routine chores around the home. No specific orthopnea, PND or pitting edema. Reports her palpitations have overall been well-controlled and she only experiences brief palpitations approximately once per month. Usually occur in the setting of stress. She does limit her caffeine intake to 1.5 cups of coffee a day and does not consume alcohol.  Studies Reviewed:   EKG: EKG is not ordered today. EKG from 05/23/2023 is reviewed and shows NSR, HR 73 with no acute ST changes.   Zio Patch: 09/2021 ZIO XT reviewed.  7 days, 14 hours analyzed.  Predominant rhythm is sinus with heart rate ranging from 52 bpm up to 131 bpm and average heart  rate 67 bpm.   There were rare PACs including atrial couplets and triplets representing less than 1% total beats.  There were also rare PVCs representing less than 1% total beats.   Brief episodes of PSVT were noted, the longest of which lasted only 12 beats with average heart rate at 110 bpm.  There were no sustained arrhythmias or pauses.   Patient triggered events did not correlate with any specific arrhythmia.  Coronary CTA: 02/2022 IMPRESSION: 1. Coronary calcium score of 0.   2. Normal coronary origin with right dominance.   3. Normal coronary arteries.   RECOMMENDATIONS: 1. No evidence of CAD (0%). Consider non-atherosclerotic causes of chest pain.    Physical Exam:   VS:  BP 130/70 (BP Location: Right Arm, Patient Position: Sitting, Cuff Size: Normal)   Pulse 64   Ht 5' (1.524 m)   Wt 152 lb (68.9 kg)   SpO2 97%   BMI 29.69 kg/m    Wt Readings from Last 3 Encounters:  01/23/24 152 lb (68.9 kg)  06/20/23 157 lb 13.6 oz (71.6 kg)  06/18/23 157 lb 13.6 oz (71.6 kg)     GEN: Well nourished, well developed female appearing in no acute distress NECK: No JVD; No carotid bruits CARDIAC: RRR, no murmurs, rubs, gallops RESPIRATORY:  Clear to auscultation without rales, wheezing or rhonchi  ABDOMEN: Appears non-distended. No obvious abdominal masses. EXTREMITIES: No clubbing or cyanosis. No pitting edema.  Distal pedal pulses are 2+ bilaterally.   Assessment and Plan:   1. Palpitations - Prior monitor in 2023 showed brief  SVT along with PAC's and PVC's but less than 1% burden. Symptoms have overall been well-controlled as discussed above. Will continue current medical therapy with Toprol -XL 25 mg daily. Was encouraged to continue to limit caffeine consumption.   2. History of Chest Pain - Coronary CTA in 02/2022 showed normal coronary arteries. Continue with risk factor modification.   Signed, Dorma Gash, PA-C

## 2024-01-23 NOTE — Patient Instructions (Signed)
 Medication Instructions:  Your physician recommends that you continue on your current medications as directed. Please refer to the Current Medication list given to you today.  *If you need a refill on your cardiac medications before your next appointment, please call your pharmacy*  Lab Work: NONE   If you have labs (blood work) drawn today and your tests are completely normal, you will receive your results only by: MyChart Message (if you have MyChart) OR A paper copy in the mail If you have any lab test that is abnormal or we need to change your treatment, we will call you to review the results.  Testing/Procedures: NONE   Follow-Up: At [facility], you and your health needs are our priority.  As part of our continuing mission to provide you with exceptional heart care, our providers are all part of one team.  This team includes your primary Cardiologist (physician) and Advanced Practice Providers or APPs (Physician Assistants and Nurse Practitioners) who all work together to provide you with the care you need, when you need it.  Your next appointment:   1 year(s)  Provider:   You may see Teddie Favre, MD or one of the following Advanced Practice Providers on your designated Care Team:   Woodfin Hays, PA-C  [place], [place] Theotis Flake, [place]     We recommend signing up for the patient portal called "MyChart".  Sign up information is provided on this After Visit Summary.  MyChart is used to connect with patients for Virtual Visits (Telemedicine).  Patients are able to view lab/test results, encounter notes, upcoming appointments, etc.  Non-urgent messages can be sent to your provider as well.   To learn more about what you can do with MyChart, go to [email or web address].   Other Instructions Thank you for choosing [facility]!

## 2024-03-06 DIAGNOSIS — H5340 Unspecified visual field defects: Secondary | ICD-10-CM | POA: Diagnosis not present

## 2024-03-06 DIAGNOSIS — I720 Aneurysm of carotid artery: Secondary | ICD-10-CM | POA: Diagnosis not present

## 2024-03-11 ENCOUNTER — Ambulatory Visit (HOSPITAL_COMMUNITY)
Admission: RE | Admit: 2024-03-11 | Discharge: 2024-03-11 | Disposition: A | Discharge status: Home / Self Care | Source: Ambulatory Visit | Attending: Internal Medicine | Admitting: Internal Medicine

## 2024-03-11 ENCOUNTER — Other Ambulatory Visit (HOSPITAL_COMMUNITY): Payer: Self-pay | Admitting: Internal Medicine

## 2024-03-11 DIAGNOSIS — M25511 Pain in right shoulder: Secondary | ICD-10-CM

## 2024-03-11 DIAGNOSIS — M5412 Radiculopathy, cervical region: Secondary | ICD-10-CM | POA: Diagnosis not present

## 2024-03-11 DIAGNOSIS — M5136 Other intervertebral disc degeneration, lumbar region with discogenic back pain only: Secondary | ICD-10-CM | POA: Diagnosis not present

## 2024-03-11 DIAGNOSIS — M159 Polyosteoarthritis, unspecified: Secondary | ICD-10-CM | POA: Diagnosis not present

## 2024-03-11 DIAGNOSIS — G5600 Carpal tunnel syndrome, unspecified upper limb: Secondary | ICD-10-CM | POA: Diagnosis not present

## 2024-03-11 DIAGNOSIS — G894 Chronic pain syndrome: Secondary | ICD-10-CM | POA: Diagnosis not present

## 2024-03-11 DIAGNOSIS — F1120 Opioid dependence, uncomplicated: Secondary | ICD-10-CM | POA: Diagnosis not present

## 2024-03-11 DIAGNOSIS — M67911 Unspecified disorder of synovium and tendon, right shoulder: Secondary | ICD-10-CM | POA: Diagnosis not present

## 2024-03-11 DIAGNOSIS — M5134 Other intervertebral disc degeneration, thoracic region: Secondary | ICD-10-CM | POA: Diagnosis not present

## 2024-03-11 DIAGNOSIS — M19011 Primary osteoarthritis, right shoulder: Secondary | ICD-10-CM | POA: Diagnosis not present

## 2024-03-13 ENCOUNTER — Ambulatory Visit (HOSPITAL_COMMUNITY)
Admission: RE | Admit: 2024-03-13 | Discharge: 2024-03-13 | Disposition: A | Discharge status: Home / Self Care | Source: Ambulatory Visit | Attending: Internal Medicine | Admitting: Internal Medicine

## 2024-03-13 DIAGNOSIS — M19011 Primary osteoarthritis, right shoulder: Secondary | ICD-10-CM | POA: Diagnosis not present

## 2024-03-13 DIAGNOSIS — M25511 Pain in right shoulder: Secondary | ICD-10-CM | POA: Diagnosis not present

## 2024-03-13 DIAGNOSIS — M7581 Other shoulder lesions, right shoulder: Secondary | ICD-10-CM | POA: Diagnosis not present

## 2024-03-28 DIAGNOSIS — M25512 Pain in left shoulder: Secondary | ICD-10-CM | POA: Diagnosis not present

## 2024-03-28 DIAGNOSIS — M25511 Pain in right shoulder: Secondary | ICD-10-CM | POA: Diagnosis not present

## 2024-03-28 DIAGNOSIS — M542 Cervicalgia: Secondary | ICD-10-CM | POA: Diagnosis not present

## 2024-03-28 DIAGNOSIS — G5603 Carpal tunnel syndrome, bilateral upper limbs: Secondary | ICD-10-CM | POA: Diagnosis not present

## 2024-03-28 DIAGNOSIS — G8929 Other chronic pain: Secondary | ICD-10-CM | POA: Diagnosis not present

## 2024-03-28 DIAGNOSIS — G5601 Carpal tunnel syndrome, right upper limb: Secondary | ICD-10-CM | POA: Diagnosis not present

## 2024-03-28 DIAGNOSIS — G5602 Carpal tunnel syndrome, left upper limb: Secondary | ICD-10-CM | POA: Diagnosis not present

## 2024-04-09 DIAGNOSIS — M25511 Pain in right shoulder: Secondary | ICD-10-CM | POA: Diagnosis not present

## 2024-04-11 DIAGNOSIS — G5602 Carpal tunnel syndrome, left upper limb: Secondary | ICD-10-CM | POA: Diagnosis not present

## 2024-04-22 ENCOUNTER — Other Ambulatory Visit: Payer: Self-pay | Admitting: Internal Medicine

## 2024-04-22 DIAGNOSIS — Z1231 Encounter for screening mammogram for malignant neoplasm of breast: Secondary | ICD-10-CM

## 2024-04-25 DIAGNOSIS — M79642 Pain in left hand: Secondary | ICD-10-CM | POA: Diagnosis not present

## 2024-04-29 DIAGNOSIS — D518 Other vitamin B12 deficiency anemias: Secondary | ICD-10-CM | POA: Diagnosis not present

## 2024-04-29 DIAGNOSIS — N951 Menopausal and female climacteric states: Secondary | ICD-10-CM | POA: Diagnosis not present

## 2024-04-29 DIAGNOSIS — M159 Polyosteoarthritis, unspecified: Secondary | ICD-10-CM | POA: Diagnosis not present

## 2024-04-29 DIAGNOSIS — Z6826 Body mass index (BMI) 26.0-26.9, adult: Secondary | ICD-10-CM | POA: Diagnosis not present

## 2024-04-29 DIAGNOSIS — F411 Generalized anxiety disorder: Secondary | ICD-10-CM | POA: Diagnosis not present

## 2024-05-08 DIAGNOSIS — N951 Menopausal and female climacteric states: Secondary | ICD-10-CM | POA: Diagnosis not present

## 2024-05-08 DIAGNOSIS — E538 Deficiency of other specified B group vitamins: Secondary | ICD-10-CM | POA: Diagnosis not present

## 2024-05-08 DIAGNOSIS — R7309 Other abnormal glucose: Secondary | ICD-10-CM | POA: Diagnosis not present

## 2024-05-08 DIAGNOSIS — Z0001 Encounter for general adult medical examination with abnormal findings: Secondary | ICD-10-CM | POA: Diagnosis not present

## 2024-05-10 ENCOUNTER — Other Ambulatory Visit: Payer: Self-pay | Admitting: Cardiology

## 2024-06-03 DIAGNOSIS — K589 Irritable bowel syndrome without diarrhea: Secondary | ICD-10-CM | POA: Diagnosis not present

## 2024-06-03 DIAGNOSIS — Z299 Encounter for prophylactic measures, unspecified: Secondary | ICD-10-CM | POA: Diagnosis not present

## 2024-06-03 DIAGNOSIS — R110 Nausea: Secondary | ICD-10-CM | POA: Diagnosis not present

## 2024-06-03 DIAGNOSIS — I10 Essential (primary) hypertension: Secondary | ICD-10-CM | POA: Diagnosis not present

## 2024-06-03 DIAGNOSIS — M545 Low back pain, unspecified: Secondary | ICD-10-CM | POA: Diagnosis not present

## 2024-06-03 DIAGNOSIS — R002 Palpitations: Secondary | ICD-10-CM | POA: Diagnosis not present

## 2024-06-10 DIAGNOSIS — M542 Cervicalgia: Secondary | ICD-10-CM | POA: Diagnosis not present

## 2024-07-30 ENCOUNTER — Ambulatory Visit
Admission: RE | Admit: 2024-07-30 | Discharge: 2024-07-30 | Disposition: A | Discharge status: Home / Self Care | Source: Ambulatory Visit | Attending: Internal Medicine | Admitting: Internal Medicine

## 2024-07-30 ENCOUNTER — Ambulatory Visit

## 2024-07-30 DIAGNOSIS — Z1231 Encounter for screening mammogram for malignant neoplasm of breast: Secondary | ICD-10-CM

## 2024-07-31 ENCOUNTER — Ambulatory Visit
